# Patient Record
Sex: Female | Born: 1975 | Race: White | Hispanic: No | Smoking: Never smoker
Health system: Southern US, Community
[De-identification: ages and names within clinical notes are randomized; demographics above are authoritative.]

## PROBLEM LIST (undated history)

## (undated) DIAGNOSIS — F32A Depression, unspecified: Secondary | ICD-10-CM

## (undated) DIAGNOSIS — F329 Major depressive disorder, single episode, unspecified: Secondary | ICD-10-CM

## (undated) DIAGNOSIS — G8929 Other chronic pain: Secondary | ICD-10-CM

## (undated) DIAGNOSIS — F4310 Post-traumatic stress disorder, unspecified: Secondary | ICD-10-CM

## (undated) DIAGNOSIS — F419 Anxiety disorder, unspecified: Secondary | ICD-10-CM

## (undated) DIAGNOSIS — M25569 Pain in unspecified knee: Secondary | ICD-10-CM

## (undated) DIAGNOSIS — F988 Other specified behavioral and emotional disorders with onset usually occurring in childhood and adolescence: Secondary | ICD-10-CM

---

## 1997-09-15 ENCOUNTER — Other Ambulatory Visit: Admission: RE | Admit: 1997-09-15 | Discharge: 1997-09-15 | Payer: Self-pay | Admitting: Obstetrics

## 2001-08-22 ENCOUNTER — Emergency Department (HOSPITAL_COMMUNITY): Admission: EM | Admit: 2001-08-22 | Discharge: 2001-08-23 | Payer: Self-pay | Admitting: Emergency Medicine

## 2001-12-02 ENCOUNTER — Encounter: Payer: Self-pay | Admitting: Family Medicine

## 2001-12-02 ENCOUNTER — Emergency Department (HOSPITAL_COMMUNITY): Admission: EM | Admit: 2001-12-02 | Discharge: 2001-12-02 | Payer: Self-pay | Admitting: Emergency Medicine

## 2002-03-21 ENCOUNTER — Encounter: Payer: Self-pay | Admitting: Pulmonary Disease

## 2002-03-21 ENCOUNTER — Ambulatory Visit (HOSPITAL_COMMUNITY): Admission: RE | Admit: 2002-03-21 | Discharge: 2002-03-21 | Payer: Self-pay | Admitting: Pulmonary Disease

## 2002-10-14 ENCOUNTER — Emergency Department (HOSPITAL_COMMUNITY): Admission: EM | Admit: 2002-10-14 | Discharge: 2002-10-14 | Payer: Self-pay | Admitting: *Deleted

## 2003-03-19 ENCOUNTER — Emergency Department (HOSPITAL_COMMUNITY): Admission: EM | Admit: 2003-03-19 | Discharge: 2003-03-19 | Payer: Self-pay | Admitting: Emergency Medicine

## 2003-03-21 ENCOUNTER — Emergency Department (HOSPITAL_COMMUNITY): Admission: AD | Admit: 2003-03-21 | Discharge: 2003-03-21 | Payer: Self-pay | Admitting: Emergency Medicine

## 2003-03-21 ENCOUNTER — Ambulatory Visit (HOSPITAL_COMMUNITY): Admission: RE | Admit: 2003-03-21 | Discharge: 2003-03-21 | Payer: Self-pay | Admitting: Emergency Medicine

## 2003-04-02 ENCOUNTER — Encounter: Admission: RE | Admit: 2003-04-02 | Discharge: 2003-05-27 | Payer: Self-pay | Admitting: Family Medicine

## 2003-05-27 ENCOUNTER — Other Ambulatory Visit: Admission: RE | Admit: 2003-05-27 | Discharge: 2003-05-27 | Payer: Self-pay | Admitting: Family Medicine

## 2003-08-27 ENCOUNTER — Encounter: Admission: RE | Admit: 2003-08-27 | Discharge: 2003-09-28 | Payer: Self-pay | Admitting: Sports Medicine

## 2003-09-04 ENCOUNTER — Emergency Department (HOSPITAL_COMMUNITY): Admission: EM | Admit: 2003-09-04 | Discharge: 2003-09-04 | Payer: Self-pay | Admitting: Emergency Medicine

## 2003-10-11 ENCOUNTER — Emergency Department (HOSPITAL_COMMUNITY): Admission: EM | Admit: 2003-10-11 | Discharge: 2003-10-11 | Payer: Self-pay | Admitting: Emergency Medicine

## 2003-10-12 ENCOUNTER — Emergency Department (HOSPITAL_COMMUNITY): Admission: EM | Admit: 2003-10-12 | Discharge: 2003-10-12 | Payer: Self-pay | Admitting: Emergency Medicine

## 2003-10-13 ENCOUNTER — Emergency Department (HOSPITAL_COMMUNITY): Admission: EM | Admit: 2003-10-13 | Discharge: 2003-10-14 | Payer: Self-pay | Admitting: Emergency Medicine

## 2003-11-23 ENCOUNTER — Emergency Department (HOSPITAL_COMMUNITY): Admission: EM | Admit: 2003-11-23 | Discharge: 2003-11-23 | Payer: Self-pay | Admitting: Emergency Medicine

## 2003-12-15 ENCOUNTER — Emergency Department (HOSPITAL_COMMUNITY): Admission: EM | Admit: 2003-12-15 | Discharge: 2003-12-15 | Payer: Self-pay | Admitting: Emergency Medicine

## 2003-12-17 ENCOUNTER — Emergency Department (HOSPITAL_COMMUNITY): Admission: EM | Admit: 2003-12-17 | Discharge: 2003-12-17 | Payer: Self-pay | Admitting: Family Medicine

## 2003-12-20 ENCOUNTER — Emergency Department (HOSPITAL_COMMUNITY): Admission: EM | Admit: 2003-12-20 | Discharge: 2003-12-20 | Payer: Self-pay | Admitting: Family Medicine

## 2003-12-23 ENCOUNTER — Emergency Department (HOSPITAL_COMMUNITY): Admission: EM | Admit: 2003-12-23 | Discharge: 2003-12-23 | Payer: Self-pay | Admitting: Emergency Medicine

## 2004-01-30 ENCOUNTER — Emergency Department (HOSPITAL_COMMUNITY): Admission: EM | Admit: 2004-01-30 | Discharge: 2004-01-30 | Payer: Self-pay | Admitting: Family Medicine

## 2004-04-08 ENCOUNTER — Emergency Department (HOSPITAL_COMMUNITY): Admission: EM | Admit: 2004-04-08 | Discharge: 2004-04-08 | Payer: Self-pay | Admitting: Emergency Medicine

## 2004-04-15 ENCOUNTER — Emergency Department (HOSPITAL_COMMUNITY): Admission: EM | Admit: 2004-04-15 | Discharge: 2004-04-15 | Payer: Self-pay | Admitting: Emergency Medicine

## 2004-05-25 ENCOUNTER — Emergency Department (HOSPITAL_COMMUNITY): Admission: EM | Admit: 2004-05-25 | Discharge: 2004-05-26 | Payer: Self-pay

## 2004-05-30 ENCOUNTER — Emergency Department (HOSPITAL_COMMUNITY): Admission: EM | Admit: 2004-05-30 | Discharge: 2004-05-30 | Payer: Self-pay | Admitting: Family Medicine

## 2004-08-11 ENCOUNTER — Inpatient Hospital Stay (HOSPITAL_COMMUNITY): Admission: AD | Admit: 2004-08-11 | Discharge: 2004-08-12 | Payer: Self-pay | Admitting: Obstetrics & Gynecology

## 2004-08-22 ENCOUNTER — Emergency Department (HOSPITAL_COMMUNITY): Admission: EM | Admit: 2004-08-22 | Discharge: 2004-08-22 | Payer: Self-pay | Admitting: Emergency Medicine

## 2004-08-22 ENCOUNTER — Inpatient Hospital Stay (HOSPITAL_COMMUNITY): Admission: AD | Admit: 2004-08-22 | Discharge: 2004-08-22 | Payer: Self-pay | Admitting: Obstetrics & Gynecology

## 2004-08-22 ENCOUNTER — Emergency Department (HOSPITAL_COMMUNITY): Admission: EM | Admit: 2004-08-22 | Discharge: 2004-08-22 | Payer: Self-pay | Admitting: Family Medicine

## 2004-09-23 ENCOUNTER — Emergency Department (HOSPITAL_COMMUNITY): Admission: EM | Admit: 2004-09-23 | Discharge: 2004-09-23 | Payer: Self-pay | Admitting: Emergency Medicine

## 2004-09-28 ENCOUNTER — Emergency Department (HOSPITAL_COMMUNITY): Admission: EM | Admit: 2004-09-28 | Discharge: 2004-09-28 | Payer: Self-pay | Admitting: Emergency Medicine

## 2004-12-19 ENCOUNTER — Emergency Department (HOSPITAL_COMMUNITY): Admission: EM | Admit: 2004-12-19 | Discharge: 2004-12-19 | Payer: Self-pay | Admitting: Emergency Medicine

## 2005-02-06 ENCOUNTER — Emergency Department (HOSPITAL_COMMUNITY): Admission: EM | Admit: 2005-02-06 | Discharge: 2005-02-06 | Payer: Self-pay | Admitting: Emergency Medicine

## 2005-08-28 ENCOUNTER — Emergency Department (HOSPITAL_COMMUNITY): Admission: EM | Admit: 2005-08-28 | Discharge: 2005-08-29 | Payer: Self-pay | Admitting: Emergency Medicine

## 2006-05-31 ENCOUNTER — Emergency Department (HOSPITAL_COMMUNITY): Admission: EM | Admit: 2006-05-31 | Discharge: 2006-05-31 | Payer: Self-pay | Admitting: Emergency Medicine

## 2006-10-24 ENCOUNTER — Emergency Department (HOSPITAL_COMMUNITY): Admission: EM | Admit: 2006-10-24 | Discharge: 2006-10-24 | Payer: Self-pay | Admitting: Emergency Medicine

## 2006-11-05 ENCOUNTER — Emergency Department (HOSPITAL_COMMUNITY): Admission: EM | Admit: 2006-11-05 | Discharge: 2006-11-05 | Payer: Self-pay | Admitting: *Deleted

## 2007-03-07 ENCOUNTER — Emergency Department (HOSPITAL_COMMUNITY): Admission: EM | Admit: 2007-03-07 | Discharge: 2007-03-07 | Payer: Self-pay | Admitting: Emergency Medicine

## 2007-03-21 ENCOUNTER — Inpatient Hospital Stay (HOSPITAL_COMMUNITY): Admission: AD | Admit: 2007-03-21 | Discharge: 2007-03-21 | Payer: Self-pay | Admitting: Gynecology

## 2007-04-01 ENCOUNTER — Inpatient Hospital Stay (HOSPITAL_COMMUNITY): Admission: AD | Admit: 2007-04-01 | Discharge: 2007-04-01 | Payer: Self-pay | Admitting: Obstetrics and Gynecology

## 2007-05-27 ENCOUNTER — Emergency Department (HOSPITAL_COMMUNITY): Admission: EM | Admit: 2007-05-27 | Discharge: 2007-05-27 | Payer: Self-pay | Admitting: Emergency Medicine

## 2007-07-22 ENCOUNTER — Emergency Department (HOSPITAL_COMMUNITY): Admission: EM | Admit: 2007-07-22 | Discharge: 2007-07-22 | Payer: Self-pay | Admitting: Emergency Medicine

## 2007-07-23 ENCOUNTER — Emergency Department (HOSPITAL_COMMUNITY): Admission: EM | Admit: 2007-07-23 | Discharge: 2007-07-23 | Payer: Self-pay | Admitting: Family Medicine

## 2008-01-10 ENCOUNTER — Emergency Department (HOSPITAL_COMMUNITY): Admission: EM | Admit: 2008-01-10 | Discharge: 2008-01-10 | Payer: Self-pay | Admitting: Emergency Medicine

## 2008-06-21 IMAGING — CR DG KNEE COMPLETE 4+V*L*
4 series · 4 of 4 positions shown · non-contrast
Comparison: None.

CLINICAL DATA: Blunt injury to patella ? pain and swelling. 
 LEFT KNEE ? 4 VIEW:

[view not recorded (1 of 4)]
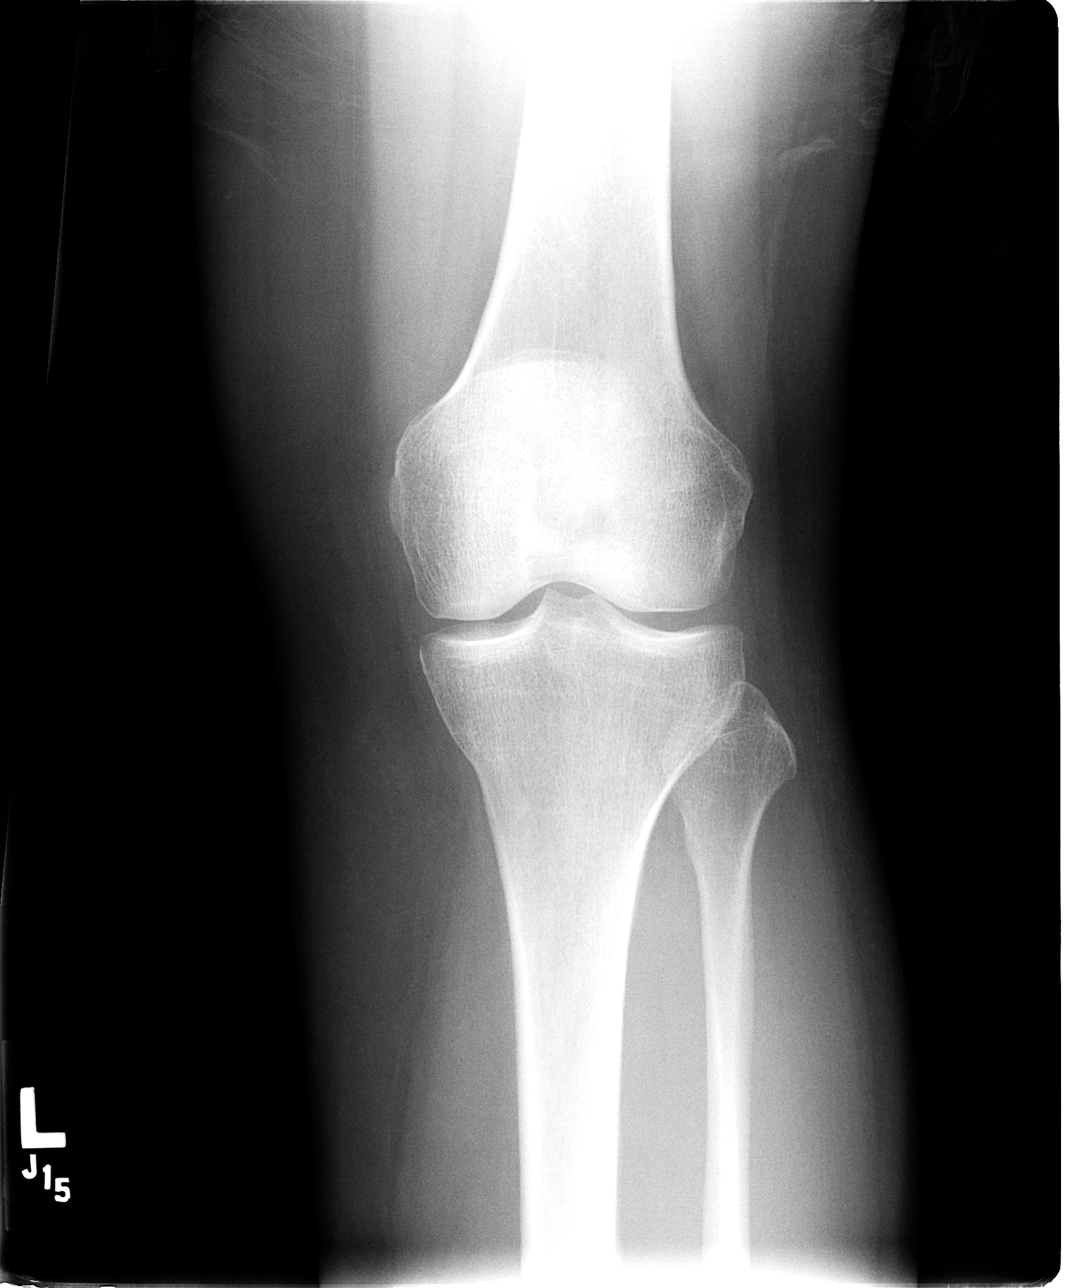

[view not recorded (2 of 4)]
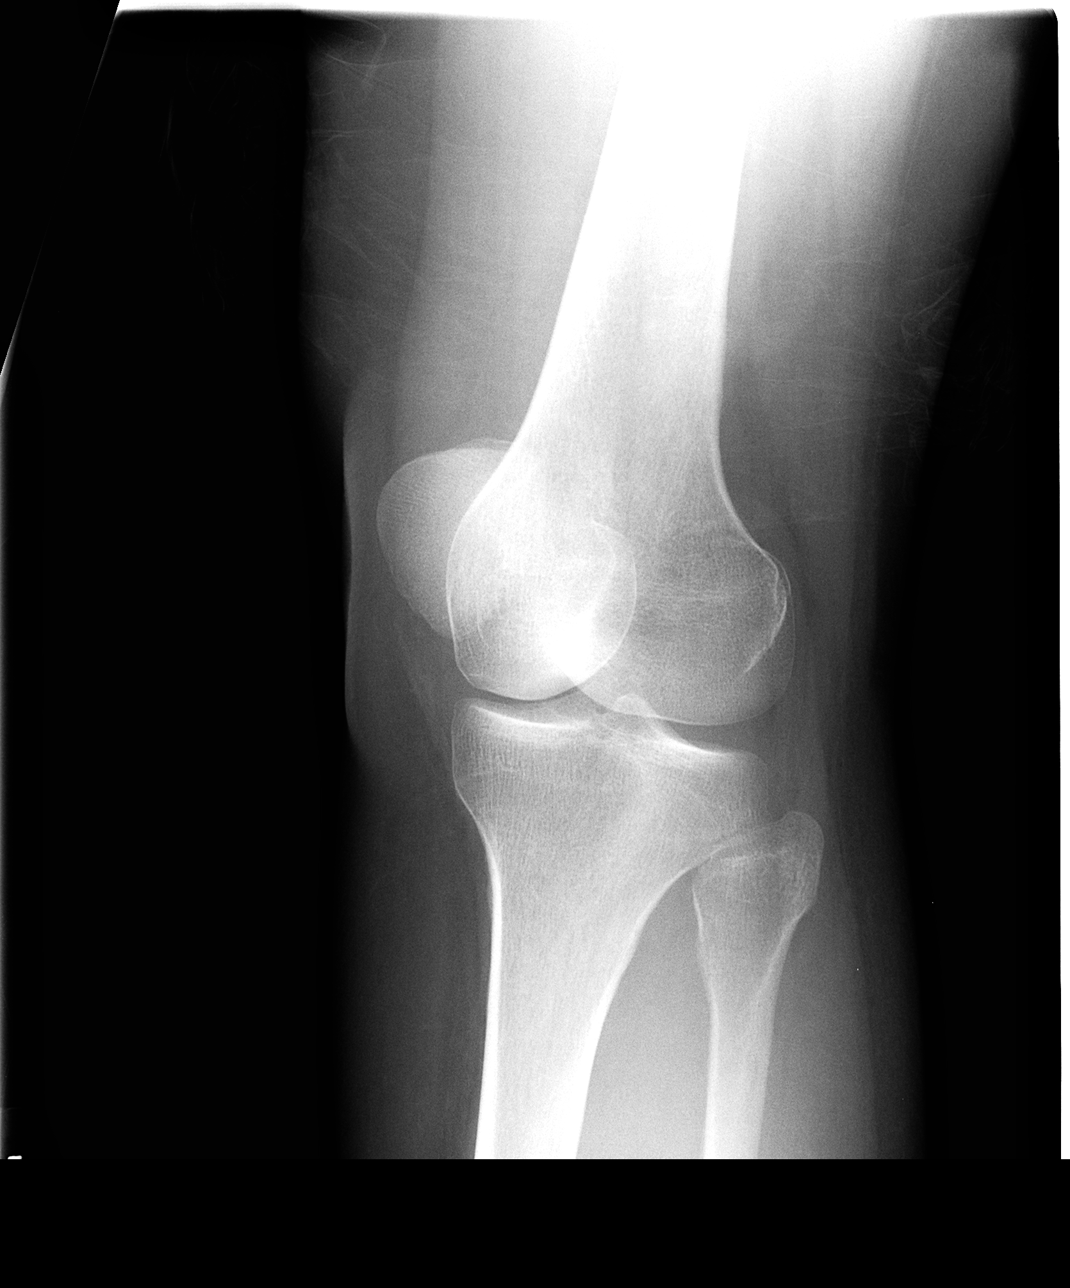

[view not recorded (3 of 4)]
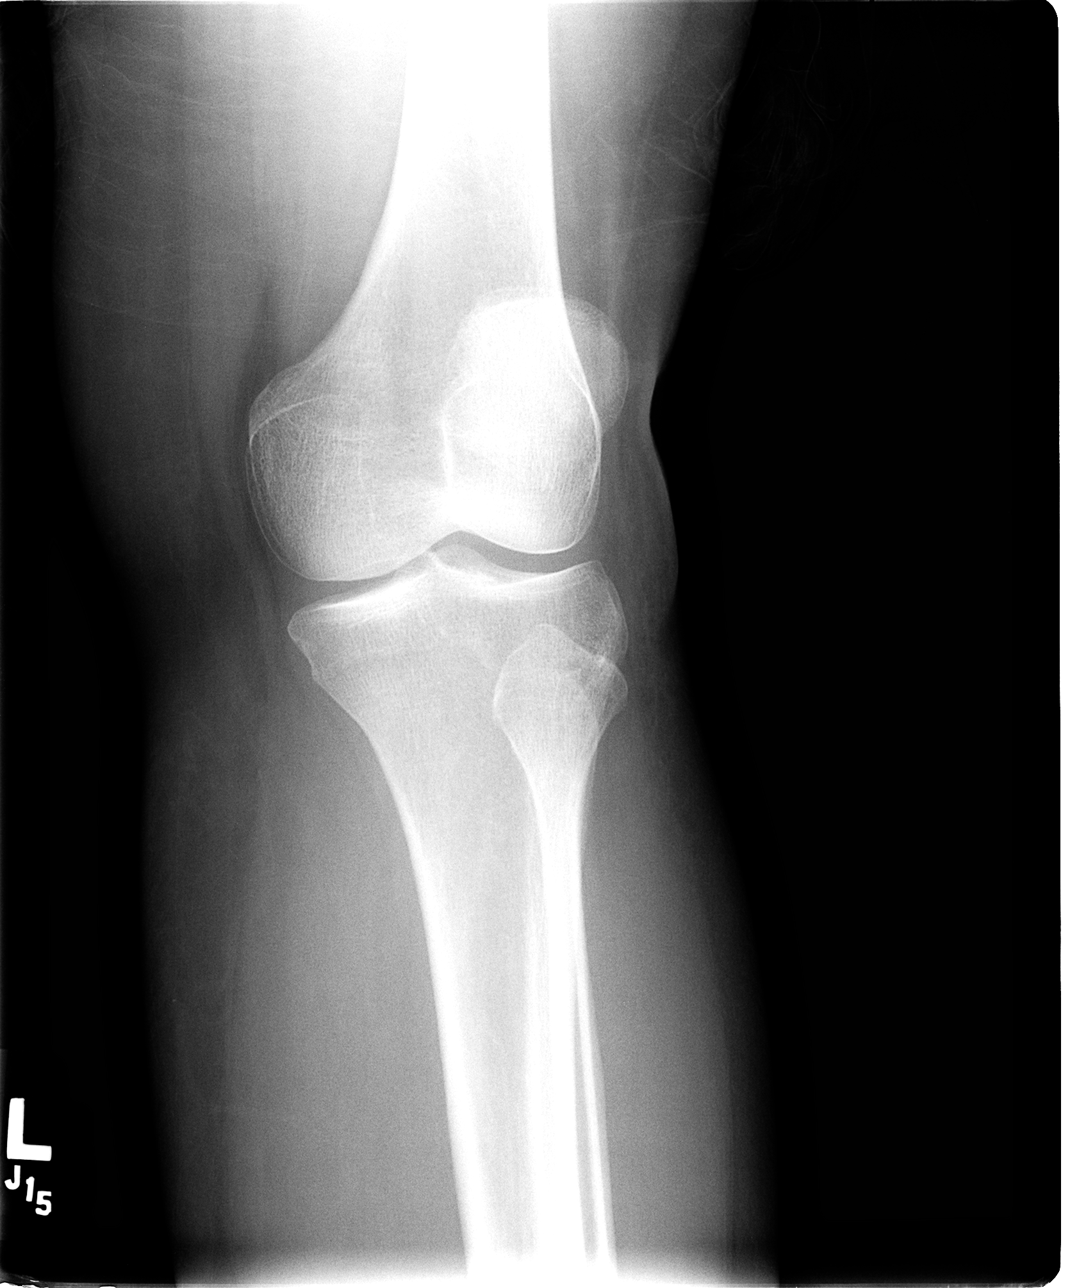

[view not recorded (4 of 4)]
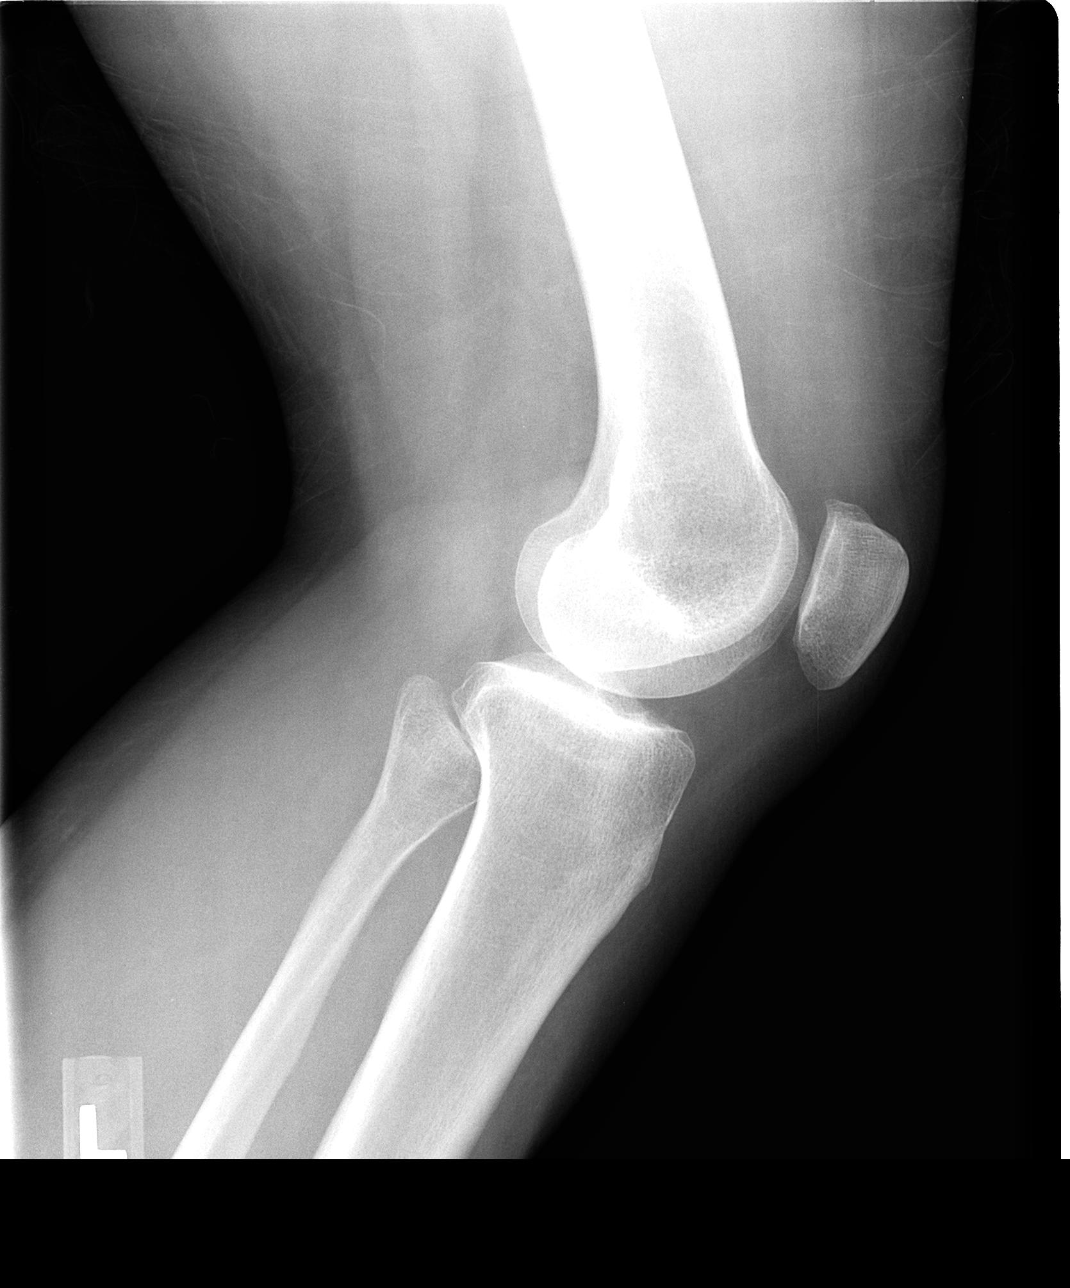

[4 of 4 positions shown; findings below may reference images not displayed]

FINDINGS: There is minor degenerative spurring of the patella.  There is no fracture, dislocation, or joint effusion.
IMPRESSION: Mild degenerative changes ? no acute findings.

## 2008-07-09 ENCOUNTER — Emergency Department (HOSPITAL_BASED_OUTPATIENT_CLINIC_OR_DEPARTMENT_OTHER): Admission: EM | Admit: 2008-07-09 | Discharge: 2008-07-09 | Payer: Self-pay | Admitting: Emergency Medicine

## 2008-10-04 ENCOUNTER — Emergency Department (HOSPITAL_BASED_OUTPATIENT_CLINIC_OR_DEPARTMENT_OTHER): Admission: EM | Admit: 2008-10-04 | Discharge: 2008-10-04 | Payer: Self-pay | Admitting: Emergency Medicine

## 2009-08-02 ENCOUNTER — Emergency Department (HOSPITAL_BASED_OUTPATIENT_CLINIC_OR_DEPARTMENT_OTHER): Admission: EM | Admit: 2009-08-02 | Discharge: 2009-08-02 | Payer: Self-pay | Admitting: Emergency Medicine

## 2009-08-02 ENCOUNTER — Ambulatory Visit: Payer: Self-pay | Admitting: Diagnostic Radiology

## 2009-08-09 ENCOUNTER — Inpatient Hospital Stay (HOSPITAL_COMMUNITY): Admission: AD | Admit: 2009-08-09 | Discharge: 2009-08-09 | Payer: Self-pay | Admitting: Family Medicine

## 2009-08-09 ENCOUNTER — Ambulatory Visit: Payer: Self-pay | Admitting: Physician Assistant

## 2010-03-03 ENCOUNTER — Emergency Department (HOSPITAL_BASED_OUTPATIENT_CLINIC_OR_DEPARTMENT_OTHER)
Admission: EM | Admit: 2010-03-03 | Discharge: 2010-03-03 | Payer: Self-pay | Source: Home / Self Care | Admitting: Emergency Medicine

## 2010-06-05 ENCOUNTER — Encounter: Payer: Self-pay | Admitting: Physical Medicine and Rehabilitation

## 2010-06-06 ENCOUNTER — Emergency Department (HOSPITAL_BASED_OUTPATIENT_CLINIC_OR_DEPARTMENT_OTHER)
Admission: EM | Admit: 2010-06-06 | Discharge: 2010-06-06 | Payer: Self-pay | Source: Home / Self Care | Admitting: Emergency Medicine

## 2010-08-07 LAB — URINALYSIS, ROUTINE W REFLEX MICROSCOPIC
Glucose, UA: NEGATIVE mg/dL
Hgb urine dipstick: NEGATIVE
Ketones, ur: 40 mg/dL — AB
Nitrite: NEGATIVE
Protein, ur: NEGATIVE mg/dL
Specific Gravity, Urine: >1.03 — ABNORMAL HIGH (ref 1.005–1.030)
Urobilinogen, UA: 1 mg/dL (ref 0.0–1.0)
pH: 6 (ref 5.0–8.0)

## 2010-08-08 LAB — DIFFERENTIAL
Basophils Absolute: 0.1 10*3/uL (ref 0.0–0.1)
Basophils Relative: 1 % (ref 0–1)
Eosinophils Absolute: 0.1 10*3/uL (ref 0.0–0.7)
Eosinophils Relative: 1 % (ref 0–5)
Lymphocytes Relative: 21 % (ref 12–46)
Lymphs Abs: 1.5 10*3/uL (ref 0.7–4.0)
Monocytes Absolute: 0.4 10*3/uL (ref 0.1–1.0)
Monocytes Relative: 6 % (ref 3–12)
Neutro Abs: 5.2 10*3/uL (ref 1.7–7.7)
Neutrophils Relative %: 71 % (ref 43–77)

## 2010-08-08 LAB — CBC
HCT: 37.8 % (ref 36.0–46.0)
Hemoglobin: 13 g/dL (ref 12.0–15.0)
MCHC: 34.4 g/dL (ref 30.0–36.0)
MCV: 86.4 fL (ref 78.0–100.0)
Platelets: 280 10*3/uL (ref 150–400)
RBC: 4.38 MIL/uL (ref 3.87–5.11)
RDW: 11.5 % (ref 11.5–15.5)
WBC: 7.3 10*3/uL (ref 4.0–10.5)

## 2010-08-08 LAB — BASIC METABOLIC PANEL
Chloride: 102 mEq/L (ref 96–112)
Creatinine, Ser: 0.6 mg/dL (ref 0.4–1.2)
GFR calc Af Amer: >60 mL/min (ref >60)
Potassium: 3.9 mEq/L (ref 3.5–5.1)

## 2010-08-08 LAB — ABO/RH: ABO/RH(D): O POS

## 2010-08-08 LAB — URINALYSIS, ROUTINE W REFLEX MICROSCOPIC
Bilirubin Urine: NEGATIVE
Glucose, UA: NEGATIVE mg/dL
Hgb urine dipstick: NEGATIVE
Ketones, ur: 15 mg/dL — AB
Nitrite: NEGATIVE
Protein, ur: NEGATIVE mg/dL
Specific Gravity, Urine: 1.022 (ref 1.005–1.030)
Urobilinogen, UA: 1 mg/dL (ref 0.0–1.0)
pH: 8 (ref 5.0–8.0)

## 2010-08-08 LAB — HEPATIC FUNCTION PANEL
Albumin: 4.2 g/dL (ref 3.5–5.2)
Alkaline Phosphatase: 79 U/L (ref 39–117)
Total Protein: 7.9 g/dL (ref 6.0–8.3)

## 2010-08-08 LAB — PREGNANCY, URINE: Preg Test, Ur: POSITIVE

## 2010-08-08 LAB — LIPASE, BLOOD: Lipase: 23 U/L (ref 23–300)

## 2010-08-08 LAB — HCG, QUANTITATIVE, PREGNANCY: hCG, Beta Chain, Quant, S: 68752 m[IU]/mL — ABNORMAL HIGH (ref <5)

## 2010-08-09 ENCOUNTER — Emergency Department (HOSPITAL_BASED_OUTPATIENT_CLINIC_OR_DEPARTMENT_OTHER)
Admission: EM | Admit: 2010-08-09 | Discharge: 2010-08-09 | Disposition: A | Discharge status: Home / Self Care | Payer: Self-pay | Attending: Emergency Medicine | Admitting: Emergency Medicine

## 2010-08-09 DIAGNOSIS — M25569 Pain in unspecified knee: Secondary | ICD-10-CM | POA: Insufficient documentation

## 2010-10-30 ENCOUNTER — Emergency Department (HOSPITAL_COMMUNITY)
Admission: EM | Admit: 2010-10-30 | Discharge: 2010-10-30 | Disposition: A | Discharge status: Home / Self Care | Payer: Self-pay | Attending: Emergency Medicine | Admitting: Emergency Medicine

## 2010-10-30 DIAGNOSIS — H5710 Ocular pain, unspecified eye: Secondary | ICD-10-CM | POA: Insufficient documentation

## 2010-10-30 DIAGNOSIS — R51 Headache: Secondary | ICD-10-CM | POA: Insufficient documentation

## 2010-12-02 ENCOUNTER — Emergency Department (HOSPITAL_BASED_OUTPATIENT_CLINIC_OR_DEPARTMENT_OTHER)
Admission: EM | Admit: 2010-12-02 | Discharge: 2010-12-02 | Disposition: A | Discharge status: Home / Self Care | Payer: Self-pay | Attending: Emergency Medicine | Admitting: Emergency Medicine

## 2010-12-02 ENCOUNTER — Encounter: Payer: Self-pay | Admitting: Emergency Medicine

## 2010-12-02 DIAGNOSIS — H5710 Ocular pain, unspecified eye: Secondary | ICD-10-CM | POA: Insufficient documentation

## 2010-12-02 DIAGNOSIS — H18829 Corneal disorder due to contact lens, unspecified eye: Secondary | ICD-10-CM | POA: Insufficient documentation

## 2010-12-02 DIAGNOSIS — S0501XA Injury of conjunctiva and corneal abrasion without foreign body, right eye, initial encounter: Secondary | ICD-10-CM

## 2010-12-02 MED ORDER — FLUORESCEIN SODIUM 1 MG OP STRP
ORAL_STRIP | OPHTHALMIC | Status: AC
  Administered 2010-12-02: 09:00:00
  Filled 2010-12-02: qty 1

## 2010-12-02 MED ORDER — TETANUS-DIPHTHERIA TOXOIDS TD 5-2 LFU IM INJ
0.5000 mL | INJECTION | Freq: Once | INTRAMUSCULAR | Status: AC
  Administered 2010-12-02: 0.5 mL via INTRAMUSCULAR
  Filled 2010-12-02: qty 0.5

## 2010-12-02 MED ORDER — OXYCODONE-ACETAMINOPHEN 5-325 MG PO TABS: 1.0000 | ORAL_TABLET | Freq: Four times a day (QID) | ORAL | Status: AC | PRN

## 2010-12-02 MED ORDER — TETRACAINE HCL 0.5 % OP SOLN
OPHTHALMIC | Status: AC
  Administered 2010-12-02: 09:00:00
  Filled 2010-12-02: qty 2

## 2010-12-02 MED ORDER — CIPROFLOXACIN HCL 0.3 % OP OINT: TOPICAL_OINTMENT | OPHTHALMIC | Status: AC

## 2010-12-02 NOTE — ED Notes (Signed)
MD at bedside. 

## 2010-12-02 NOTE — ED Provider Notes (Addendum)
History    History of Present Illness   Patient Identification Heidi Pearson is a 35 y.o. female.  Patient information was obtained from patient. History/Exam limitations: none. Patient presented to the Emergency Department ambulatory.  Chief Complaint  Eye Pain   Patient presents for evaluation of redness, foreign body sensation, photophobia and pain in right eye to the right eye . Onset of symptoms was gradual starting 3 days ago, with gradually worsening since that time. There is no discharge present. There is not a history trauma to the eye. There is not a history of foreign body getting into eye. The patient is a contact lens wearer.  No past medical history on file. No family history on file. Current Facility-Administered Medications  Medication Dose Route Frequency Provider Last Rate Last Dose  . fluorescein 1 MG ophthalmic strip           . tetanus & diphtheria toxoids (adult) [facility]) injection 0.5 mL  0.5 mL Intramuscular Once Ethelda Chick, MD      . tetracaine (PONTOCAINE) 0.5 % ophthalmic solution            Current Outpatient Prescriptions  Medication Sig Dispense Refill  . ibuprofen (ADVIL,MOTRIN) 800 MG tablet Take 800 mg by mouth every 8 (eight) hours as needed.         No Known Allergies History   Social History  . Marital Status: Legally Separated    Spouse Name: N/A    Number of Children: N/A  . Years of Education: N/A   Occupational History  . Not on file.   Social History Main Topics  . Smoking status: Not on file  . Smokeless tobacco: Not on file  . Alcohol Use:   . Drug Use:   . Sexually Active:    Other Topics Concern  . Not on file   Social History Narrative  . No narrative on file    Review of Systems Pertinent items are noted in HPI. 10 Systems reviewed and are negative for acute change except as noted in the HPI.  Physical Exam   BP 143/92  Pulse 95  Temp(Src) 98.9 F (37.2 C) (Oral)  Resp 18  Ht 5\' 3"  (1.6 m)  Wt  170 lb (77.111 kg)  BMI 30.11 kg/m2  SpO2 96%  LMP 11/18/2010 Visual Acuity: OS: 20/50      OD:  20/50, OU 20/30 General: Awake. Alert in no acute distress. HEENT: atraumatic, normocephalic. PERRLA EOMI. Conjunctiva: positive for injection. Conjunctiva: negative for lid erythema.  Fluorescein negative for ulcers. Positive for approx 1mm abrasion at approx 12 oclock position Neck- no cervical LAD Lungs- CTAB CV-RRR no murmur, no rubs, no gallops  ED Course   Studies: None indicated.  Records Reviewed: Old medical records. Nursing notes.  Treatments:  Disposition: Home. Eye drops, pain control, opthalmology referral  Chief Complaint  Patient presents with  . Eye Pain   HPI  No past medical history on file.  Past Surgical History  Procedure Date  . Cesarean section     No family history on file.  History  Substance Use Topics  . Smoking status: Not on file  . Smokeless tobacco: Not on file  . Alcohol Use:     OB History    Grav Para Term Preterm Abortions TAB SAB Ect Mult Living                  Review of Systems  Physical Exam  BP 143/92  Pulse  95  Temp(Src) 98.9 F (37.2 C) (Oral)  Resp 18  Ht 5\' 3"  (1.6 m)  Wt 170 lb (77.111 kg)  BMI 30.11 kg/m2  SpO2 96%  LMP 11/18/2010  Physical Exam  ED Course  Procedures  MDM Pt with corneal abrasion on right cornea, pt is contact lens wearer.  Discharged with pain meds, eye drops and opthalmology referral       Ethelda Chick, MD 12/02/10 [phone]

## 2010-12-02 NOTE — ED Notes (Signed)
Pt states she took contact lens out of R eye with increased irritation and Non stop pain per pt report with tearing.x 24hrs

## 2010-12-02 NOTE — ED Notes (Signed)
Eye patch applied to right eye

## 2010-12-06 ENCOUNTER — Emergency Department (HOSPITAL_BASED_OUTPATIENT_CLINIC_OR_DEPARTMENT_OTHER)
Admission: EM | Admit: 2010-12-06 | Discharge: 2010-12-06 | Disposition: A | Discharge status: Home / Self Care | Payer: Self-pay | Attending: Emergency Medicine | Admitting: Emergency Medicine

## 2010-12-06 ENCOUNTER — Encounter (HOSPITAL_BASED_OUTPATIENT_CLINIC_OR_DEPARTMENT_OTHER): Payer: Self-pay | Admitting: *Deleted

## 2010-12-06 DIAGNOSIS — H5710 Ocular pain, unspecified eye: Secondary | ICD-10-CM | POA: Insufficient documentation

## 2010-12-06 DIAGNOSIS — H209 Unspecified iridocyclitis: Secondary | ICD-10-CM | POA: Insufficient documentation

## 2010-12-06 MED ORDER — HOMATROPINE HBR 2 % OP SOLN
OPHTHALMIC | Status: AC
  Administered 2010-12-06: 22:00:00 via OPHTHALMIC
  Filled 2010-12-06: qty 5

## 2010-12-06 MED ORDER — FLUORESCEIN SODIUM 1 MG OP STRP
ORAL_STRIP | OPHTHALMIC | Status: AC
  Filled 2010-12-06: qty 1

## 2010-12-06 MED ORDER — OXYCODONE-ACETAMINOPHEN 5-325 MG PO TABS: 1.0000 | ORAL_TABLET | Freq: Four times a day (QID) | ORAL | Status: AC | PRN

## 2010-12-06 MED ORDER — TETRACAINE HCL 0.5 % OP SOLN
OPHTHALMIC | Status: AC
  Filled 2010-12-06: qty 2

## 2010-12-06 NOTE — ED Provider Notes (Signed)
History     Chief Complaint  Patient presents with  . Eye Pain   HPI  Pt seen in the ED for R eye pain a few days ago and diagnosed with corneal abrasion. She has been using cipro ointment, not wearing contacts and taking pain medications, but eye has continued to be very painful, particular with bright lights. No drainage from the eye. She has had some headache from the pain. No blurry vision.   History reviewed. No pertinent past medical history.  Past Surgical History  Procedure Date  . Cesarean section     History reviewed. No pertinent family history.  History  Substance Use Topics  . Smoking status: Not on file  . Smokeless tobacco: Not on file  . Alcohol Use:     OB History    Grav Para Term Preterm Abortions TAB SAB Ect Mult Living                  Review of Systems  Constitutional: Negative for fever.  HENT: Negative for congestion and sore throat.   Eyes: Positive for photophobia and pain.  Respiratory: Negative for cough and shortness of breath.   Cardiovascular: Negative for chest pain.  Gastrointestinal: Negative for vomiting and abdominal pain.  Genitourinary: Negative for dysuria.  Musculoskeletal: Negative for myalgias.  Skin: Negative for rash.  Neurological: Positive for headaches.    Physical Exam  BP 119/58  Pulse 80  Temp(Src) 98.9 F (37.2 C) (Oral)  Resp 16  Ht 5\' 3"  (1.6 m)  Wt 170 lb (77.111 kg)  BMI 30.11 kg/m2  SpO2 100%  LMP 11/18/2010  Physical Exam  Nursing note and vitals reviewed. Constitutional: She is oriented to person, place, and time. She appears well-developed and well-nourished.  HENT:  Head: Normocephalic and atraumatic.  Eyes: EOM are normal. Pupils are equal, round, and reactive to light. Right eye exhibits no discharge. Left eye exhibits no discharge.       No abrasion or ulceration seen on fluorescein exam. Anterior chamber is clear. Pain with consensual light response. IOP on the R is 10.   Neck: Normal  range of motion. Neck supple.  Cardiovascular: Normal rate, normal heart sounds and intact distal pulses.   Pulmonary/Chest: Effort normal and breath sounds normal.  Abdominal: Bowel sounds are normal. She exhibits no distension. There is no tenderness.  Musculoskeletal: Normal range of motion. She exhibits no edema and no tenderness.  Neurological: She is alert and oriented to person, place, and time. No cranial nerve deficit.  Skin: Skin is warm and dry. No rash noted.  Psychiatric: She has a normal mood and affect.    ED Course  Procedures  MDM Likely has a reactive iritis. No evidence of acute glaucoma or cells in anterior chamber. Minimal relief with cycloplegics. Given bottle of homatropine and advised to use 2 drops every 6-8hrs PRN pain. She has followup with Ophtho scheduled in 3 days.       Charles B. Bernette Mayers, MD 12/06/10 2300

## 2010-12-06 NOTE — ED Notes (Signed)
Was seen here on 7/20 and dx'd with corneal abrasion to right eye. Pt c/o pain that keeps her from sleeping and can't see eye doctor until Friday.

## 2011-02-21 LAB — URINALYSIS, ROUTINE W REFLEX MICROSCOPIC
Ketones, ur: 15 — AB
Nitrite: NEGATIVE
Protein, ur: NEGATIVE
Urobilinogen, UA: 0.2
pH: 6.5

## 2011-02-21 LAB — WET PREP, GENITAL

## 2011-02-21 LAB — RAPID STREP SCREEN (MED CTR MEBANE ONLY): Streptococcus, Group A Screen (Direct): NEGATIVE

## 2011-02-22 LAB — ABO/RH: ABO/RH(D): O POS

## 2011-02-22 LAB — DIFFERENTIAL
Lymphocytes Relative: 26
Monocytes Absolute: 0.6
Monocytes Relative: 9
Neutro Abs: 4.9
Neutrophils Relative %: 65

## 2011-02-22 LAB — URINALYSIS, ROUTINE W REFLEX MICROSCOPIC
Nitrite: NEGATIVE
Specific Gravity, Urine: 1.025
pH: 6

## 2011-02-22 LAB — CBC
Hemoglobin: 11.1 — ABNORMAL LOW
RBC: 3.52 — ABNORMAL LOW
WBC: 7.5

## 2011-02-22 LAB — WET PREP, GENITAL
Clue Cells Wet Prep HPF POC: NONE SEEN
Trich, Wet Prep: NONE SEEN

## 2011-02-22 LAB — PREGNANCY, URINE: Preg Test, Ur: POSITIVE

## 2011-02-22 LAB — GC/CHLAMYDIA PROBE AMP, GENITAL
Chlamydia, DNA Probe: NEGATIVE
GC Probe Amp, Genital: NEGATIVE

## 2012-01-07 ENCOUNTER — Encounter (HOSPITAL_COMMUNITY): Payer: Self-pay | Admitting: Emergency Medicine

## 2012-01-07 ENCOUNTER — Emergency Department (HOSPITAL_COMMUNITY)
Admission: EM | Admit: 2012-01-07 | Discharge: 2012-01-08 | Disposition: A | Discharge status: Other Acute Inpatient Hospital | Payer: Self-pay | Attending: Emergency Medicine | Admitting: Emergency Medicine

## 2012-01-07 DIAGNOSIS — IMO0002 Reserved for concepts with insufficient information to code with codable children: Secondary | ICD-10-CM | POA: Insufficient documentation

## 2012-01-07 DIAGNOSIS — S60812A Abrasion of left wrist, initial encounter: Secondary | ICD-10-CM

## 2012-01-07 DIAGNOSIS — F32A Depression, unspecified: Secondary | ICD-10-CM

## 2012-01-07 DIAGNOSIS — F3289 Other specified depressive episodes: Secondary | ICD-10-CM | POA: Insufficient documentation

## 2012-01-07 DIAGNOSIS — F329 Major depressive disorder, single episode, unspecified: Secondary | ICD-10-CM

## 2012-01-07 DIAGNOSIS — X789XXA Intentional self-harm by unspecified sharp object, initial encounter: Secondary | ICD-10-CM | POA: Insufficient documentation

## 2012-01-07 DIAGNOSIS — R45851 Suicidal ideations: Secondary | ICD-10-CM | POA: Insufficient documentation

## 2012-01-07 DIAGNOSIS — F419 Anxiety disorder, unspecified: Secondary | ICD-10-CM | POA: Insufficient documentation

## 2012-01-07 HISTORY — DX: Depression, unspecified: F32.A

## 2012-01-07 HISTORY — DX: Major depressive disorder, single episode, unspecified: F32.9

## 2012-01-07 HISTORY — DX: Anxiety disorder, unspecified: F41.9

## 2012-01-07 LAB — POCT PREGNANCY, URINE: Preg Test, Ur: NEGATIVE

## 2012-01-07 LAB — COMPREHENSIVE METABOLIC PANEL
Albumin: 3.4 g/dL — ABNORMAL LOW (ref 3.5–5.2)
BUN: 7 mg/dL (ref 6–23)
Calcium: 9.5 mg/dL (ref 8.4–10.5)
Creatinine, Ser: 0.72 mg/dL (ref 0.50–1.10)
Total Bilirubin: 0.2 mg/dL — ABNORMAL LOW (ref 0.3–1.2)
Total Protein: 7.9 g/dL (ref 6.0–8.3)

## 2012-01-07 LAB — RAPID URINE DRUG SCREEN, HOSP PERFORMED
Benzodiazepines: POSITIVE — AB
Cocaine: POSITIVE — AB

## 2012-01-07 LAB — CBC
HCT: 39.1 % (ref 36.0–46.0)
MCH: 28.1 pg (ref 26.0–34.0)
MCHC: 32.2 g/dL (ref 30.0–36.0)
MCV: 87.3 fL (ref 78.0–100.0)
RDW: 12.4 % (ref 11.5–15.5)

## 2012-01-07 LAB — ACETAMINOPHEN LEVEL: Acetaminophen (Tylenol), Serum: <15 ug/mL (ref 10–30)

## 2012-01-07 LAB — ETHANOL: Alcohol, Ethyl (B): <11 mg/dL (ref 0–11)

## 2012-01-07 MED ORDER — AMPHETAMINE-DEXTROAMPHETAMINE 10 MG PO TABS
10.0000 mg | ORAL_TABLET | Freq: Two times a day (BID) | ORAL | Status: DC
  Filled 2012-01-07: qty 1

## 2012-01-07 MED ORDER — METHADONE HCL 5 MG PO TABS
130.0000 mg | ORAL_TABLET | Freq: Every day | ORAL | Status: DC
  Administered 2012-01-07: 130 mg via ORAL

## 2012-01-07 MED ORDER — LORAZEPAM 1 MG PO TABS
1.0000 mg | ORAL_TABLET | Freq: Three times a day (TID) | ORAL | Status: DC | PRN
  Administered 2012-01-07: 1 mg via ORAL
  Filled 2012-01-07 (×2): qty 1

## 2012-01-07 MED ORDER — ZOLPIDEM TARTRATE 5 MG PO TABS
5.0000 mg | ORAL_TABLET | Freq: Every evening | ORAL | Status: DC | PRN
  Administered 2012-01-08: 5 mg via ORAL
  Filled 2012-01-07: qty 1

## 2012-01-07 MED ORDER — ACETAMINOPHEN 325 MG PO TABS: 650.0000 mg | ORAL_TABLET | ORAL | Status: DC | PRN

## 2012-01-07 MED ORDER — LORAZEPAM 1 MG PO TABS
1.0000 mg | ORAL_TABLET | Freq: Once | ORAL | Status: AC
Start: 2012-01-07 — End: 2012-01-07
  Administered 2012-01-07: 1 mg via ORAL
  Filled 2012-01-07: qty 1

## 2012-01-07 MED ORDER — NICOTINE 21 MG/24HR TD PT24: 21.0000 mg | MEDICATED_PATCH | Freq: Every day | TRANSDERMAL | Status: DC | PRN

## 2012-01-07 MED ORDER — ALUM & MAG HYDROXIDE-SIMETH 200-200-20 MG/5ML PO SUSP: 30.0000 mL | ORAL | Status: DC | PRN

## 2012-01-07 MED ORDER — IBUPROFEN 200 MG PO TABS: 400.0000 mg | ORAL_TABLET | Freq: Three times a day (TID) | ORAL | Status: DC | PRN

## 2012-01-07 MED ORDER — ONDANSETRON HCL 8 MG PO TABS: 4.0000 mg | ORAL_TABLET | Freq: Three times a day (TID) | ORAL | Status: DC | PRN

## 2012-01-07 NOTE — ED Notes (Signed)
Pt confronted about cocaine in drug test and stated she only used it once.

## 2012-01-07 NOTE — ED Notes (Signed)
Charge RN notified need of sitter.

## 2012-01-07 NOTE — ED Provider Notes (Signed)
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 Telepsych consult requested.[phone]  Hurman Horn, MD 01/11/12 [phone]

## 2012-01-07 NOTE — ED Notes (Signed)
Patient refused to take her adderall because she will not be able to sleep tonight if she takes this medication.

## 2012-01-07 NOTE — ED Notes (Signed)
Pt tearful saying that she lost her job and wants to kill herself.  States she was in the middle of slitting her wrist with a razor when her niece caught her and stopped her.  Pt requesting xanax stating that she feels anxious, although pt appears sleepy. PT denies use of narcotics or etoh, stating she is in a heroin program.

## 2012-01-07 NOTE — B[facility] Counselor (Addendum)
[facility] contact ACT Team for more information regarding patient's methadone use; spoke with [facility] and provided the following information:  Pt current takes130mg  methadone liquid; working on methadone program since Oct 10, 2010; pt states that her Counselor at [employer] in [place] is [place]; pt states that her counselor did speak with her within past two days about her positive cocaine test; pt states that she could lose her methadone privileges if the use continues  Waynetta Sandy states that she will continue to staff patient for possible treatment at [facility]...  Dr. Fonnie Jarvis will be ordering a telepsych for patient.Marland KitchenMarland Kitchen

## 2012-01-07 NOTE — ED Notes (Signed)
Patient talking with Telepsych at this time.

## 2012-01-07 NOTE — ED Provider Notes (Signed)
History     CSN: [id number]  Arrival date & time 01/07/12  0149   First MD Initiated Contact with Patient 01/07/12 0247      Chief Complaint  Patient presents with  . Suicidal     HPI Pt was seen at 0305.   Per pt, c/o gradual onset and worsening of persistent depression for the past several weeks, worse over the past several days.  Pt states she has been under "a lot of stress" and "lost my job" recently.  Pt states she attempted to cut her left wrist with a razor blade PTA.  States she has been eval by her PMD for depression, was previously on Wellbutrin, but was told "to stop it and just take xanax and adderall."  Denies hx of previous SA.  Denies HI.       Td UTD Past Medical History  Diagnosis Date  . Anxiety     Past Surgical History  Procedure Date  . Cesarean section     History  Substance Use Topics  . Smoking status: Not on file  . Smokeless tobacco: Not on file  . Alcohol Use: No    Review of Systems ROS: Statement: All systems negative except as marked or noted in the HPI; Constitutional: Negative for fever and chills. ; ; Eyes: Negative for eye pain, redness and discharge. ; ; ENMT: Negative for ear pain, hoarseness, nasal congestion, sinus pressure and sore throat. ; ; Cardiovascular: Negative for chest pain, palpitations, diaphoresis, dyspnea and peripheral edema. ; ; Respiratory: Negative for cough, wheezing and stridor. ; ; Gastrointestinal: Negative for nausea, vomiting, diarrhea, abdominal pain, blood in stool, hematemesis, jaundice and rectal bleeding. . ; ; Genitourinary: Negative for dysuria, flank pain and hematuria. ; ; Musculoskeletal: Negative for back pain and neck pain. Negative for swelling and trauma.; ; Skin: Negative for pruritus, rash, abrasions, blisters, bruising and skin lesion.; ; Neuro: Negative for headache, lightheadedness and neck stiffness. Negative for weakness, altered level of consciousness , altered mental status, extremity weakness,  paresthesias, involuntary movement, seizure and syncope.; Psych:  +tearful, depression, SI, SA.  No HI, no hallucinations.      Allergies  Review of patient's allergies indicates no known allergies.  Home Medications   Current Outpatient Rx  Name Route Sig Dispense Refill  . ALPRAZOLAM 1 MG PO TABS Oral Take 1 mg by mouth at bedtime as needed. sleep    . AMPHETAMINE-DEXTROAMPHETAMINE 10 MG PO TABS Oral Take 10 mg by mouth 2 (two) times daily.    Marland Kitchen METHADONE HCL 10 MG/ML PO CONC Oral Take 130 mg by mouth daily. Withdrawal treatment program      BP 102/69  Pulse 82  Temp 98.5 F (36.9 C) (Oral)  SpO2 97%  LMP 01/03/2012  Physical Exam 0310: Physical examination:  Nursing notes reviewed; Vital signs and O2 SAT reviewed;  Constitutional: Well developed, Well nourished, Well hydrated, In no acute distress; Head:  Normocephalic, atraumatic; Eyes: EOMI, PERRL, No scleral icterus; ENMT: Mouth and pharynx normal, Mucous membranes moist; Neck: Supple, Full range of motion, No lymphadenopathy; Cardiovascular: Regular rate and rhythm, No murmur, rub, or gallop; Respiratory: Breath sounds clear & equal bilaterally, No rales, rhonchi, wheezes.  Speaking full sentences with ease, Normal respiratory effort/excursion; Chest: Nontender, Movement normal;; Extremities: Pulses normal, No tenderness, No edema, No calf edema or asymmetry.; Neuro: AA&Ox3, Major CN grossly intact.  Speech clear. No gross focal motor or sensory deficits in extremities.; Skin: Color normal, Warm, Dry, +several small  very superficial abrasions to left volar wrist.; Psych:  +tearful, +SI.    ED Course  Procedures   MDM  MDM Reviewed: nursing note and vitals Interpretation: labs   Results for orders placed during the hospital encounter of 01/07/12  CBC      Component Value Range   WBC 5.3  4.0 - 10.5 K/uL   RBC 4.48  3.87 - 5.11 MIL/uL   Hemoglobin 12.6  12.0 - 15.0 g/dL   HCT 96.0  45.4 - 09.8 %   MCV 87.3  78.0 -  100.0 fL   MCH 28.1  26.0 - 34.0 pg   MCHC 32.2  30.0 - 36.0 g/dL   RDW 11.9  14.7 - 82.9 %   Platelets 343  150 - 400 K/uL  COMPREHENSIVE METABOLIC PANEL      Component Value Range   Sodium 141  135 - 145 mEq/L   Potassium 3.3 (*) 3.5 - 5.1 mEq/L   Chloride 103  96 - 112 mEq/L   CO2 28  19 - 32 mEq/L   Glucose, Bld 111 (*) 70 - 99 mg/dL   BUN 7  6 - 23 mg/dL   Creatinine, Ser 5.62  0.50 - 1.10 mg/dL   Calcium 9.5  8.4 - 13.0 mg/dL   Total Protein 7.9  6.0 - 8.3 g/dL   Albumin 3.4 (*) 3.5 - 5.2 g/dL   AST 28  0 - 37 U/L   ALT 30  0 - 35 U/L   Alkaline Phosphatase 98  39 - 117 U/L   Total Bilirubin 0.2 (*) 0.3 - 1.2 mg/dL   GFR calc non Af Amer >90  >90 mL/min   GFR calc Af Amer >90  >90 mL/min  ETHANOL      Component Value Range   Alcohol, Ethyl (B) <11  0 - 11 mg/dL  ACETAMINOPHEN LEVEL      Component Value Range   Acetaminophen (Tylenol), Serum <15.0  10 - 30 ug/mL  URINE RAPID DRUG SCREEN (HOSP PERFORMED)      Component Value Range   Opiates NONE DETECTED  NONE DETECTED   Cocaine POSITIVE (*) NONE DETECTED   Benzodiazepines POSITIVE (*) NONE DETECTED   Amphetamines NONE DETECTED  NONE DETECTED   Tetrahydrocannabinol NONE DETECTED  NONE DETECTED   Barbiturates NONE DETECTED  NONE DETECTED  POCT PREGNANCY, URINE      Component Value Range   Preg Test, Ur NEGATIVE  NEGATIVE    0330:  ACT aware of pt, will eval.             Laray Anger, DO 01/07/12 1732

## 2012-01-07 NOTE — BH Assessment (Signed)
Assessment Note   Heidi Pearson is an 36 y.o. female present with SUA via cutting her wrist.  Pt was in complete despair during the assessment, sobbing and restless throughout.  Pt states "I lost my job and my family has to help me out all the time, it would just be better if I wasn't here anymore.  I don't care if I live or die, I can't leave, I don't know what I'll do to myself".  Pt is presently in a methadone maintenance program with [facility] in [place], [place].  Pt is a mother of 2 children who live with their father in [place].  Pt present has a boyfriend she describes as supportive.  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  Pt endorses using opioids for around 5 years and heroin for around 5 months before beginning methadone maintenance in May 2013.  Pt cannot contract for safety and has agreed to inpt care for suicidiality and depression.  Referred to [facility] and OV for review.    Axis I: Anxiety Disorder NOS, Mood Disorder NOS and Substance Abuse Axis II: Cluster B Traits Axis III:  Past Medical History  Diagnosis Date  . Anxiety   . Depression    Axis IV: economic problems, occupational problems and other psychosocial or environmental problems Axis V: 31-40 impairment in reality testing  Past Medical History:  Past Medical History  Diagnosis Date  . Anxiety   . Depression     Past Surgical History  Procedure Date  . Cesarean section     Family History: History reviewed. No pertinent family history.  Social History:  does not have a smoking history on file. She does not have any smokeless tobacco history on file. She reports that she uses illicit drugs (Methamphetamines). She reports that she does not drink alcohol.  Additional Social History:  Alcohol / Drug Use History of alcohol / drug use?: Yes Substance #1 Name of Substance 1: Heroin 1 - Age of First Use: 5 1 - Amount (size/oz): UNK 1 - Frequency: daily 1 - Duration: months 1 - Last Use / Amount:  May 2013 Substance #2 Name of Substance 2: Opioids 2 - Age of First Use: 30 2 - Amount (size/oz): UNK 2 - Frequency: daily 2 - Duration: 5 years 2 - Last Use / Amount: May 2013  CIWA: CIWA-Ar BP: 111/70 mmHg Pulse Rate: 78  COWS: Clinical Opiate Withdrawal Scale (COWS) Resting Pulse Rate: Pulse Rate 80 or below Sweating: Subjective report of chills or flushing Restlessness: Frequent shifting or extraneous movements of legs/arms Pupil Size: Pupils possibly larger than normal for room light Bone or Joint Aches: Mild diffuse discomfort Runny Nose or Tearing: Nose running or tearing GI Upset: No GI symptoms Tremor: No tremor Yawning: No yawning Anxiety or Irritability: Patient so irritable or anxious that participation in the assessment is difficult Gooseflesh Skin: Piloerection of skin can be felt or hairs standing up on arms COWS Total Score: 15   Allergies: No Known Allergies  Home Medications:  (Not in a hospital admission)  OB/GYN Status:  Patient's last menstrual period was 01/03/2012.  General Assessment Data Location of Assessment: [facility] ED ACT Assessment: Yes Living Arrangements: Alone Can pt return to current living arrangement?: Yes Admission Status: Voluntary Is patient capable of signing voluntary admission?: Yes Transfer from: Home Referral Source: Self/Family/Friend     Risk to self Suicidal Ideation: Yes-Currently Present Suicidal Intent: Yes-Currently Present Is patient at risk for suicide?: Yes  Suicidal Plan?: No-Not Currently/Within Last 6 Months Access to Means: Yes Specify Access to Suicidal Means: razors What has been your use of drugs/alcohol within the last 12 months?: Opioids, UDS + for cocaine, Methadone Maint. Program Previous Attempts/Gestures: No How many times?: 0  Other Self Harm Risks: recovering addict Triggers for Past Attempts: None known Intentional Self Injurious Behavior: Cutting Family Suicide History: No Recent stressful life  event(s): Financial Problems;Job Loss Persecutory voices/beliefs?: No Depression: Yes Depression Symptoms: Despondent;Insomnia;Tearfulness;Isolating;Fatigue;Guilt;Loss of interest in usual pleasures;Feeling worthless/self pity Substance abuse history and/or treatment for substance abuse?: Yes (Methadone Maint., UDS+ for Cocaine) Suicide prevention information given to non-admitted patients: Not applicable  Risk to Others Homicidal Ideation: No Thoughts of Harm to Others: No Current Homicidal Intent: No Current Homicidal Plan: No Access to Homicidal Means: No Identified Victim: none History of harm to others?: No Assessment of Violence: None Noted Violent Behavior Description: none Does patient have access to weapons?: Yes (Comment) (sharps) Criminal Charges Pending?: No Does patient have a court date: No  Psychosis Hallucinations: None noted Delusions: None noted  Mental Status Report Appear/Hygiene: Disheveled Eye Contact: Fair Motor Activity: Unremarkable Speech: Logical/coherent;Rapid Level of Consciousness: Alert Mood: Anxious;Depressed;Despair Affect: Sad;Depressed Anxiety Level: Panic Attacks Panic attack frequency: 2 daily Most recent panic attack: today Thought Processes: Coherent;Relevant Judgement: Impaired Orientation: Person;Place;Time;Situation Obsessive Compulsive Thoughts/Behaviors: None  Cognitive Functioning Concentration: Decreased Memory: Recent Intact;Remote Intact IQ: Average Insight: Poor Impulse Control: Poor Appetite: Poor Weight Loss: 5  Weight Gain: 0  Sleep: Decreased Total Hours of Sleep: 4  Vegetative Symptoms: None  ADLScreening [facility]) Patient's cognitive ability adequate to safely complete daily activities?: Yes Patient able to express need for assistance with ADLs?: Yes Independently performs ADLs?: Yes (appropriate for developmental age)  Abuse/Neglect [facility]) Physical Abuse: Denies Verbal Abuse:  Denies Sexual Abuse: Denies  Prior Inpatient Therapy Prior Inpatient Therapy: No Prior Therapy Dates: none Prior Therapy Facilty/Provider(s): none Reason for Treatment: none  Prior Outpatient Therapy Prior Outpatient Therapy: Yes Prior Therapy Dates: present Prior Therapy Facilty/Provider(s): [facility] Reason for Treatment: Methadone Maint.  ADL Screening (condition at time of admission) Patient's cognitive ability adequate to safely complete daily activities?: Yes Patient able to express need for assistance with ADLs?: Yes Independently performs ADLs?: Yes (appropriate for developmental age)       Abuse/Neglect Assessment (Assessment to be complete while patient is alone) Physical Abuse: Denies Verbal Abuse: Denies Sexual Abuse: Denies Exploitation of patient/patient's resources: Denies Self-Neglect: Denies Values / Beliefs Cultural Requests During Hospitalization: None Spiritual Requests During Hospitalization: None        Additional Information 1:1 In Past 12 Months?: No CIRT Risk: No Elopement Risk: No Does patient have medical clearance?: Yes     Disposition:  Disposition Disposition of Patient: Inpatient treatment program Type of inpatient treatment program: Adult  On Site Evaluation by:   Reviewed with Physician:     Ena Dawley [facility] 01/07/2012 10:49 AM

## 2012-01-07 NOTE — ED Notes (Signed)
Patient attempted to cut her wrist tonight (lacerations noted to left wrist; bleeding controlled).  Patient cut wrists with a razor; razor was taken away by friend/family member.  Patient reports suicidal thoughts; states that she has had suicidal thoughts in the past, but never acted on them.  Patient reports burning at site of lacerations.  Clean, sterile gauze applied.  Patient reports that she has been under a lot of stress at home lately; reports losing her job last week.  Denies homicidal thoughts.

## 2012-01-08 NOTE — ED Notes (Signed)
Report given to Aggie Cosier at [facility] and to [facility]. Pt belongings handed to [facility] sitter riding with pt to [facility]

## 2012-02-28 ENCOUNTER — Encounter (HOSPITAL_COMMUNITY): Payer: Self-pay | Admitting: Family Medicine

## 2012-02-28 ENCOUNTER — Emergency Department (HOSPITAL_COMMUNITY)
Admission: EM | Admit: 2012-02-28 | Discharge: 2012-02-28 | Disposition: A | Discharge status: Home / Self Care | Payer: Self-pay | Attending: Emergency Medicine | Admitting: Emergency Medicine

## 2012-02-28 DIAGNOSIS — F419 Anxiety disorder, unspecified: Secondary | ICD-10-CM

## 2012-02-28 DIAGNOSIS — F329 Major depressive disorder, single episode, unspecified: Secondary | ICD-10-CM | POA: Insufficient documentation

## 2012-02-28 DIAGNOSIS — F411 Generalized anxiety disorder: Secondary | ICD-10-CM | POA: Insufficient documentation

## 2012-02-28 DIAGNOSIS — F3289 Other specified depressive episodes: Secondary | ICD-10-CM | POA: Insufficient documentation

## 2012-02-28 LAB — POCT I-STAT, CHEM 8
BUN: 10 mg/dL (ref 6–23)
Chloride: 104 mEq/L (ref 96–112)
Sodium: 143 mEq/L (ref 135–145)

## 2012-02-28 MED ORDER — LORAZEPAM 1 MG PO TABS
2.0000 mg | ORAL_TABLET | Freq: Once | ORAL | Status: AC
  Administered 2012-02-28: 2 mg via ORAL
  Filled 2012-02-28: qty 2

## 2012-02-28 MED ORDER — LORAZEPAM 1 MG PO TABS: 1.0000 mg | ORAL_TABLET | Freq: Three times a day (TID) | ORAL | Status: DC | PRN

## 2012-02-28 MED ORDER — POTASSIUM CHLORIDE CRYS ER 20 MEQ PO TBCR
40.0000 meq | EXTENDED_RELEASE_TABLET | Freq: Once | ORAL | Status: AC
  Administered 2012-02-28: 40 meq via ORAL
  Filled 2012-02-28: qty 2

## 2012-02-28 NOTE — ED Notes (Signed)
Pt reports severe anxiety X3 days.  Pt not sleeping, eating and feels as if she will die.  Pt has been out of xanax that she takes for anxiety for 3 days.  Pt crying and extremely emotional.  Pt alert oriented X4

## 2012-02-28 NOTE — ED Notes (Signed)
Rx was given to me to call into [facility] at [phone].  Ativan, 1mg  tab, take 1 PO q Day (#15) singed MD M. Linker.  This was called to [facility] at [phone] by me.

## 2012-02-28 NOTE — ED Provider Notes (Signed)
History     CSN: [id number]  Arrival date & time 02/28/12  7829   First MD Initiated Contact with Patient 02/28/12 0940      Chief Complaint  Patient presents with  . Anxiety    (Consider location/radiation/quality/duration/timing/severity/associated sxs/prior treatment) HPI Pt presents with c/o anxiety and panic attacks.  States she has a hx of anxiety and that this has been increasing over the past several days.  Feels jittery, anxious, tearful, has not been sleeping well.  She specifically denies SI.  She has run out of her xanax several days before symptoms started.  Has had increased stress in life.  Attends methadone clinic and states she was seen there this morning for her dose and that they recommended coming to ED for anxiety.  She has made an appointment with her PMD for next week- Tuesday. Denies fever, vomiting/diarrhea.  No cough or other recent illness.  Does say she hasn't eaten much in the past few days due to anxiety.  There are no other associated systemic symptoms, there are no other alleviating or modifying factors.   Past Medical History  Diagnosis Date  . Anxiety   . Depression     Past Surgical History  Procedure Date  . Cesarean section     History reviewed. No pertinent family history.  History  Substance Use Topics  . Smoking status: Not on file  . Smokeless tobacco: Not on file  . Alcohol Use: No    OB History    Grav Para Term Preterm Abortions TAB SAB Ect Mult Living                  Review of Systems ROS reviewed and all otherwise negative except for mentioned in HPI  Allergies  Review of patient's allergies indicates no known allergies.  Home Medications   Current Outpatient Rx  Name Route Sig Dispense Refill  . ALPRAZOLAM 1 MG PO TABS Oral Take 1 mg by mouth at bedtime as needed. sleep    . AMPHETAMINE-DEXTROAMPHETAMINE 10 MG PO TABS Oral Take 10 mg by mouth 2 (two) times daily.    Marland Kitchen METHADONE HCL 10 MG/ML PO CONC Oral Take 119  mg by mouth daily. Withdrawal treatment program    . LORAZEPAM 1 MG PO TABS Oral Take 1 tablet (1 mg total) by mouth 3 (three) times daily as needed for anxiety. 6 tablet 0    BP 125/82  Pulse 82  Temp 98.7 F (37.1 C) (Oral)  Resp 24  SpO2 99%  LMP 02/14/2012 Vitals reviewed Physical Exam Physical Examination: General appearance - alert, well appearing, and in no distress Mental status - alert, oriented to person, place, and time Eyes - pupils equal and reactive, no conjunctival injection, no scleral icterus Mouth - mucous membranes moist, pharynx normal without lesions Chest - clear to auscultation, no wheezes, rales or rhonchi, symmetric air entry Heart - normal rate, regular rhythm, normal S1, S2, no murmurs, rubs, clicks or gallops Abdomen - soft, nontender, nondistended, no masses or organomegaly Neurological - alert, oriented, normal speech, no focal findings or movement disorder noted Extremities - peripheral pulses normal, no pedal edema, no clubbing or cyanosis Skin - normal coloration and turgor, no rashes Psych- anxious and tearful, but cooperative  ED Course  Procedures (including critical care time)  Labs Reviewed  POCT I-STAT, CHEM 8 - Abnormal; Notable for the following:    Potassium 3.1 (*)     All other components within normal limits  No results found.   1. Anxiety       MDM  Pt presents with c/o anxiety.  She specifically denies suicidal ideations.  She has made an appointment with her doctor for next week.  I will give a small rx for ativan, she was given ativan in the ED.  Discharged with strict return precautions.  Pt agreeable with plan.        Ethelda Chick, MD 02/28/12 1230

## 2012-02-28 NOTE — ED Notes (Signed)
Per pt 3 days of anxiety that will not go away. Pt sts out of her xanax. sts crying at triage and anxious. sts she has been under a lot of stress

## 2012-03-10 ENCOUNTER — Emergency Department (HOSPITAL_COMMUNITY)
Admission: EM | Admit: 2012-03-10 | Discharge: 2012-03-10 | Disposition: A | Discharge status: Home / Self Care | Payer: Self-pay | Attending: Emergency Medicine | Admitting: Emergency Medicine

## 2012-03-10 ENCOUNTER — Encounter (HOSPITAL_COMMUNITY): Payer: Self-pay | Admitting: Emergency Medicine

## 2012-03-10 DIAGNOSIS — F3289 Other specified depressive episodes: Secondary | ICD-10-CM | POA: Insufficient documentation

## 2012-03-10 DIAGNOSIS — F410 Panic disorder [episodic paroxysmal anxiety] without agoraphobia: Secondary | ICD-10-CM | POA: Insufficient documentation

## 2012-03-10 DIAGNOSIS — Z79899 Other long term (current) drug therapy: Secondary | ICD-10-CM | POA: Insufficient documentation

## 2012-03-10 DIAGNOSIS — F329 Major depressive disorder, single episode, unspecified: Secondary | ICD-10-CM | POA: Insufficient documentation

## 2012-03-10 DIAGNOSIS — F411 Generalized anxiety disorder: Secondary | ICD-10-CM | POA: Insufficient documentation

## 2012-03-10 DIAGNOSIS — F32A Depression, unspecified: Secondary | ICD-10-CM

## 2012-03-10 LAB — RAPID URINE DRUG SCREEN, HOSP PERFORMED
Amphetamines: NOT DETECTED
Cocaine: POSITIVE — AB
Opiates: NOT DETECTED
Tetrahydrocannabinol: NOT DETECTED

## 2012-03-10 MED ORDER — LORAZEPAM 2 MG/ML IJ SOLN
1.0000 mg | Freq: Once | INTRAMUSCULAR | Status: AC
  Administered 2012-03-10: 1 mg via INTRAVENOUS
  Filled 2012-03-10: qty 1

## 2012-03-10 MED ORDER — KETOROLAC TROMETHAMINE 30 MG/ML IJ SOLN
30.0000 mg | Freq: Once | INTRAMUSCULAR | Status: AC
  Administered 2012-03-10: 30 mg via INTRAVENOUS
  Filled 2012-03-10: qty 1

## 2012-03-10 MED ORDER — ALPRAZOLAM 0.5 MG PO TABS: ORAL_TABLET | ORAL | Status: DC

## 2012-03-10 MED ORDER — SODIUM CHLORIDE 0.9 % IV SOLN
INTRAVENOUS | Status: DC
  Administered 2012-03-10: 1000 mL via INTRAVENOUS

## 2012-03-10 MED ORDER — ALPRAZOLAM 0.25 MG PO TABS
0.5000 mg | ORAL_TABLET | Freq: Once | ORAL | Status: AC
Start: 2012-03-10 — End: 2012-03-10
  Administered 2012-03-10: 0.5 mg via ORAL
  Filled 2012-03-10: qty 1

## 2012-03-10 MED ORDER — CLONAZEPAM 0.5 MG PO TABS: 0.5000 mg | ORAL_TABLET | Freq: Two times a day (BID) | ORAL | Status: DC | PRN

## 2012-03-10 NOTE — ED Notes (Signed)
Patient brought in via [facility] patient complains of SOB times two hours. 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 [facility] [place] [facility] daily since May 28 of this year.

## 2012-03-10 NOTE — ED Provider Notes (Addendum)
History     CSN: [id number]  Arrival date & time 03/10/12  1113   First MD Initiated Contact with Patient 03/10/12 1120      Chief Complaint  Patient presents with  . Shortness of Breath    (Consider location/radiation/quality/duration/timing/severity/associated sxs/prior treatment) Patient is a 36 y.o. female presenting with shortness of breath. The history is provided by the patient and medical records.  Shortness of Breath  Associated symptoms include chest pain and shortness of breath. Pertinent negatives include no fever and no cough.   a 36 year old, female, presents to emergency department complaining of chest pressure, and shortness of breath.  For the last 2 hours.  She denies fevers, chills, cough, nausea, vomiting, sweating, leg pain or swelling.  She has had similar symptoms in the past, associated with anxiety, and panic attacks.  She states that she has recently lost her job and now.  She is to move out of her apartment.  This is causing tremendous amount of stress.  Past Medical History  Diagnosis Date  . Anxiety   . Depression     Past Surgical History  Procedure Date  . Cesarean section     No family history on file.  History  Substance Use Topics  . Smoking status: Not on file  . Smokeless tobacco: Not on file  . Alcohol Use: No    OB History    Grav Para Term Preterm Abortions TAB SAB Ect Mult Living                  Review of Systems  Constitutional: Negative for fever, chills and diaphoresis.  Respiratory: Positive for shortness of breath. Negative for cough.   Cardiovascular: Positive for chest pain. Negative for palpitations and leg swelling.  Gastrointestinal: Negative for nausea, vomiting and abdominal pain.  Psychiatric/Behavioral: The patient is nervous/anxious.   All other systems reviewed and are negative.    Allergies  Review of patient's allergies indicates no known allergies.  Home Medications   Current Outpatient Rx  Name  Route Sig Dispense Refill  . ALPRAZOLAM 1 MG PO TABS Oral Take 1 mg by mouth at bedtime as needed. sleep    . AMPHETAMINE-DEXTROAMPHETAMINE 10 MG PO TABS Oral Take 10 mg by mouth 2 (two) times daily.    Marland Kitchen LORAZEPAM 1 MG PO TABS Oral Take 1 tablet (1 mg total) by mouth 3 (three) times daily as needed for anxiety. 6 tablet 0  . METHADONE HCL 10 MG/ML PO CONC Oral Take 119 mg by mouth daily. Withdrawal treatment program      BP 114/51  Temp 98.6 F (37 C) (Oral)  Resp 18  SpO2 97%  LMP 02/14/2012  Physical Exam  Nursing note and vitals reviewed. Constitutional: She is oriented to person, place, and time. She appears well-developed and well-nourished. No distress.  HENT:  Head: Normocephalic and atraumatic.  Eyes: Conjunctivae normal and EOM are normal.  Neck: Normal range of motion. Neck supple.  Cardiovascular: Normal rate, regular rhythm and intact distal pulses.   No murmur heard. Pulmonary/Chest: Effort normal and breath sounds normal. No respiratory distress. She exhibits no tenderness.  Abdominal: Soft. Bowel sounds are normal.  Musculoskeletal: Normal range of motion.  Neurological: She is alert and oriented to person, place, and time. No cranial nerve deficit.  Skin: Skin is warm and dry.  Psychiatric:       Crying sobbing    ED Course  Procedures (including critical care time)  Labs Reviewed - No  data to display No results found.   No diagnosis found.  ECG. Normal sinus rhythm at 72 beats per minute. Normal axis. Normal intervals. Nonspecific T-wave changes  1:40 PM Still crying uncontrollably.   Denies SI.    3:24 PM Still crying uncontrollably.  Act has not seen her yet.  Will move to pod c.  MDM  Panic attack        Cheri Guppy, MD 03/10/12 1155  Cheri Guppy, MD 03/10/12 1341  Cheri Guppy, MD 03/10/12 1524
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
## 2012-03-10 NOTE — Progress Notes (Signed)
7:23 PM Telepsych consult said pt could go home, recommended clonazepam for anxiety, stress.

## 2012-03-10 NOTE — ED Notes (Signed)
Gave old and new ECG to DR [facility]. 11:37am JG.

## 2012-03-10 NOTE — ED Notes (Signed)
Patient states she has been unable to sleep and want medication to make her sleep and she want to go home and go to sleep and never wake up again. Patient states she is tired of worrying. Patient is crying and has a hard time sitting still while talking with nurse. ACT arrived and is at the bedside at this time.

## 2012-08-09 ENCOUNTER — Encounter (HOSPITAL_COMMUNITY): Payer: Self-pay | Admitting: Emergency Medicine

## 2012-08-09 ENCOUNTER — Encounter (HOSPITAL_COMMUNITY): Payer: Self-pay

## 2012-08-09 ENCOUNTER — Emergency Department (HOSPITAL_COMMUNITY)
Admission: EM | Admit: 2012-08-09 | Discharge: 2012-08-09 | Disposition: A | Discharge status: Home / Self Care | Payer: Self-pay | Attending: Emergency Medicine | Admitting: Emergency Medicine

## 2012-08-09 DIAGNOSIS — F329 Major depressive disorder, single episode, unspecified: Secondary | ICD-10-CM | POA: Insufficient documentation

## 2012-08-09 DIAGNOSIS — T424X5A Adverse effect of benzodiazepines, initial encounter: Secondary | ICD-10-CM | POA: Insufficient documentation

## 2012-08-09 DIAGNOSIS — IMO0002 Reserved for concepts with insufficient information to code with codable children: Secondary | ICD-10-CM | POA: Insufficient documentation

## 2012-08-09 DIAGNOSIS — F419 Anxiety disorder, unspecified: Secondary | ICD-10-CM

## 2012-08-09 DIAGNOSIS — F3289 Other specified depressive episodes: Secondary | ICD-10-CM | POA: Insufficient documentation

## 2012-08-09 DIAGNOSIS — F410 Panic disorder [episodic paroxysmal anxiety] without agoraphobia: Secondary | ICD-10-CM | POA: Insufficient documentation

## 2012-08-09 DIAGNOSIS — Z79899 Other long term (current) drug therapy: Secondary | ICD-10-CM | POA: Insufficient documentation

## 2012-08-09 DIAGNOSIS — F411 Generalized anxiety disorder: Secondary | ICD-10-CM | POA: Insufficient documentation

## 2012-08-09 DIAGNOSIS — R404 Transient alteration of awareness: Secondary | ICD-10-CM | POA: Insufficient documentation

## 2012-08-09 DIAGNOSIS — T424X4A Poisoning by benzodiazepines, undetermined, initial encounter: Secondary | ICD-10-CM | POA: Insufficient documentation

## 2012-08-09 MED ORDER — CLONAZEPAM 1 MG PO TABS: 1.0000 mg | ORAL_TABLET | Freq: Two times a day (BID) | ORAL | Status: DC | PRN
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
## 2012-08-09 NOTE — ED Notes (Signed)
Pt states she was seen in ED earlier today for panic attacks and they are getting worse.  Reports taking Klonopin 1 hour ago.  Pt denies suicidal ideation and homicidal ideation.  Denies visual and auditory hallucinations.  Pt states she has a history of cutting herself and is " trying really hard not to do that."
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
## 2012-08-09 NOTE — ED Provider Notes (Signed)
Medical screening examination/treatment/procedure(s) were performed by non-physician practitioner and as supervising physician I was immediately available for consultation/collaboration.   Dezmon Conover B. Bernette Mayers, MD 08/09/12 [phone]

## 2012-08-10 ENCOUNTER — Emergency Department (HOSPITAL_COMMUNITY)
Admission: EM | Admit: 2012-08-10 | Discharge: 2012-08-10 | Disposition: A | Discharge status: Home / Self Care | Payer: Self-pay | Attending: Emergency Medicine | Admitting: Emergency Medicine

## 2012-08-10 DIAGNOSIS — T424X5A Adverse effect of benzodiazepines, initial encounter: Secondary | ICD-10-CM

## 2012-08-10 DIAGNOSIS — F419 Anxiety disorder, unspecified: Secondary | ICD-10-CM

## 2012-08-10 DIAGNOSIS — R400 Somnolence: Secondary | ICD-10-CM

## 2012-08-10 LAB — COMPREHENSIVE METABOLIC PANEL
AST: 45 U/L — ABNORMAL HIGH (ref 0–37)
CO2: 24 mEq/L (ref 19–32)
Calcium: 9.4 mg/dL (ref 8.4–10.5)
Creatinine, Ser: 0.78 mg/dL (ref 0.50–1.10)
GFR calc non Af Amer: >90 mL/min (ref >90)

## 2012-08-10 LAB — RAPID URINE DRUG SCREEN, HOSP PERFORMED
Barbiturates: NOT DETECTED
Benzodiazepines: POSITIVE — AB
Cocaine: NOT DETECTED
Opiates: NOT DETECTED

## 2012-08-10 LAB — CBC WITH DIFFERENTIAL/PLATELET
Basophils Absolute: 0.1 10*3/uL (ref 0.0–0.1)
Basophils Relative: 1 % (ref 0–1)
Eosinophils Relative: 7 % — ABNORMAL HIGH (ref 0–5)
HCT: 36.3 % (ref 36.0–46.0)
MCHC: 33.3 g/dL (ref 30.0–36.0)
MCV: 84.8 fL (ref 78.0–100.0)
Monocytes Absolute: 0.9 10*3/uL (ref 0.1–1.0)
RDW: 14.7 % (ref 11.5–15.5)

## 2012-08-10 LAB — URINALYSIS, ROUTINE W REFLEX MICROSCOPIC
Bilirubin Urine: NEGATIVE
Glucose, UA: NEGATIVE mg/dL
Hgb urine dipstick: NEGATIVE
Specific Gravity, Urine: 1.021 (ref 1.005–1.030)
Urobilinogen, UA: 0.2 mg/dL (ref 0.0–1.0)
pH: 5.5 (ref 5.0–8.0)

## 2012-08-10 MED ORDER — ALUM & MAG HYDROXIDE-SIMETH 200-200-20 MG/5ML PO SUSP: 30.0000 mL | ORAL | Status: DC | PRN

## 2012-08-10 MED ORDER — MIRTAZAPINE 30 MG PO TABS
30.0000 mg | ORAL_TABLET | Freq: Every day | ORAL | Status: DC
  Filled 2012-08-10: qty 1

## 2012-08-10 MED ORDER — ONDANSETRON HCL 8 MG PO TABS: 4.0000 mg | ORAL_TABLET | Freq: Three times a day (TID) | ORAL | Status: DC | PRN

## 2012-08-10 MED ORDER — ZIPRASIDONE HCL 20 MG PO CAPS
40.0000 mg | ORAL_CAPSULE | Freq: Two times a day (BID) | ORAL | Status: DC
  Administered 2012-08-10: 40 mg via ORAL
  Filled 2012-08-10: qty 2

## 2012-08-10 MED ORDER — BUSPIRONE HCL 10 MG PO TABS
10.0000 mg | ORAL_TABLET | Freq: Three times a day (TID) | ORAL | Status: DC
  Administered 2012-08-10: 10 mg via ORAL
  Filled 2012-08-10: qty 1

## 2012-08-10 MED ORDER — ACETAMINOPHEN 325 MG PO TABS: 650.0000 mg | ORAL_TABLET | ORAL | Status: DC | PRN

## 2012-08-10 MED ORDER — LORAZEPAM 1 MG PO TABS: 0.5000 mg | ORAL_TABLET | Freq: Two times a day (BID) | ORAL | Status: DC | PRN

## 2012-08-10 MED ORDER — IBUPROFEN 400 MG PO TABS: 600.0000 mg | ORAL_TABLET | Freq: Three times a day (TID) | ORAL | Status: DC | PRN

## 2012-08-10 MED ORDER — FLUOXETINE HCL 20 MG PO TABS
40.0000 mg | ORAL_TABLET | Freq: Every day | ORAL | Status: DC
  Filled 2012-08-10: qty 2

## 2012-08-10 MED ORDER — GABAPENTIN 300 MG PO CAPS
300.0000 mg | ORAL_CAPSULE | Freq: Three times a day (TID) | ORAL | Status: DC
  Administered 2012-08-10: 300 mg via ORAL
  Filled 2012-08-10: qty 1

## 2012-08-10 NOTE — ED Provider Notes (Signed)
Patient seen by stenosis on call and medication recommendations made  Toy Baker, MD 08/10/12 1053

## 2012-08-10 NOTE — ED Notes (Signed)
Pt has walked up to triage desk several times requesting to know how many people were in front of her.  Pt appears very sleepy with slurred speech and staggering gate.  Advised pt to remain seated until name was called.  Repeat vitals obtained and WNL
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
## 2012-08-10 NOTE — ED Notes (Signed)
Pt changed into blue scrubs.  Pt also checked by security.

## 2012-08-10 NOTE — ED Notes (Signed)
Pt now more awake asking to see md.

## 2012-08-10 NOTE — ED Notes (Signed)
Pt laying on stretcher resting at this time

## 2012-08-10 NOTE — ED Notes (Signed)
Chaperoned while pt spoke with psychiatrist (TelePsych)

## 2012-08-10 NOTE — ED Notes (Signed)
Spoke with Specialist on Call--regarding wait for telepsych==was told it would be a few hours due to there being 3 or more ahead of this patient.

## 2012-08-11 ENCOUNTER — Emergency Department (HOSPITAL_COMMUNITY)
Admission: EM | Admit: 2012-08-11 | Discharge: 2012-08-13 | Disposition: A | Discharge status: Home / Self Care | Payer: Self-pay | Attending: Emergency Medicine | Admitting: Emergency Medicine

## 2012-08-11 ENCOUNTER — Encounter (HOSPITAL_COMMUNITY): Payer: Self-pay | Admitting: Emergency Medicine

## 2012-08-11 DIAGNOSIS — F411 Generalized anxiety disorder: Secondary | ICD-10-CM | POA: Insufficient documentation

## 2012-08-11 DIAGNOSIS — Z79899 Other long term (current) drug therapy: Secondary | ICD-10-CM | POA: Insufficient documentation

## 2012-08-11 DIAGNOSIS — F3289 Other specified depressive episodes: Secondary | ICD-10-CM | POA: Insufficient documentation

## 2012-08-11 DIAGNOSIS — F329 Major depressive disorder, single episode, unspecified: Secondary | ICD-10-CM | POA: Insufficient documentation

## 2012-08-11 DIAGNOSIS — Z3202 Encounter for pregnancy test, result negative: Secondary | ICD-10-CM | POA: Insufficient documentation

## 2012-08-11 DIAGNOSIS — Z760 Encounter for issue of repeat prescription: Secondary | ICD-10-CM | POA: Insufficient documentation

## 2012-08-11 DIAGNOSIS — M25569 Pain in unspecified knee: Secondary | ICD-10-CM | POA: Insufficient documentation

## 2012-08-11 DIAGNOSIS — F419 Anxiety disorder, unspecified: Secondary | ICD-10-CM

## 2012-08-11 DIAGNOSIS — R000 Tachycardia, unspecified: Secondary | ICD-10-CM | POA: Insufficient documentation

## 2012-08-11 LAB — COMPREHENSIVE METABOLIC PANEL
ALT: 84 U/L — ABNORMAL HIGH (ref 0–35)
AST: 42 U/L — ABNORMAL HIGH (ref 0–37)
Alkaline Phosphatase: 280 U/L — ABNORMAL HIGH (ref 39–117)
CO2: 23 mEq/L (ref 19–32)
GFR calc Af Amer: >90 mL/min (ref >90)
Glucose, Bld: 116 mg/dL — ABNORMAL HIGH (ref 70–99)
Potassium: 3.3 mEq/L — ABNORMAL LOW (ref 3.5–5.1)
Sodium: 135 mEq/L (ref 135–145)
Total Protein: 8.1 g/dL (ref 6.0–8.3)

## 2012-08-11 LAB — RAPID URINE DRUG SCREEN, HOSP PERFORMED
Amphetamines: NOT DETECTED
Benzodiazepines: POSITIVE — AB
Opiates: NOT DETECTED
Tetrahydrocannabinol: POSITIVE — AB

## 2012-08-11 LAB — CBC WITH DIFFERENTIAL/PLATELET
Lymphocytes Relative: 24 % (ref 12–46)
Lymphs Abs: 2.2 10*3/uL (ref 0.7–4.0)
Neutrophils Relative %: 64 % (ref 43–77)
Platelets: 395 10*3/uL (ref 150–400)
RBC: 4.48 MIL/uL (ref 3.87–5.11)
WBC: 9.2 10*3/uL (ref 4.0–10.5)

## 2012-08-11 MED ORDER — DIAZEPAM 5 MG PO TABS
5.0000 mg | ORAL_TABLET | Freq: Once | ORAL | Status: DC
  Administered 2012-08-11: 5 mg via ORAL
  Filled 2012-08-11: qty 1

## 2012-08-11 MED ORDER — ZIPRASIDONE MESYLATE 20 MG IM SOLR
10.0000 mg | Freq: Once | INTRAMUSCULAR | Status: DC
  Administered 2012-08-11: 10 mg via INTRAMUSCULAR
  Filled 2012-08-11: qty 20

## 2012-08-11 MED ORDER — DIAZEPAM 5 MG PO TABS
5.0000 mg | ORAL_TABLET | Freq: Once | ORAL | Status: AC
  Administered 2012-08-11: 5 mg via ORAL
  Filled 2012-08-11: qty 1

## 2012-08-11 MED ORDER — ZIPRASIDONE MESYLATE 20 MG IM SOLR
20.0000 mg | Freq: Once | INTRAMUSCULAR | Status: AC
  Administered 2012-08-11: 20 mg via INTRAMUSCULAR
  Filled 2012-08-11: qty 20

## 2012-08-11 NOTE — ED Notes (Signed)
Pt. Eating. Removed fork and knife from tray.

## 2012-08-11 NOTE — ED Notes (Signed)
Pt requested me to come to see her and she told me that she wants to hurt herself. She pointed at her right wrist and she told me she had been smashing her arm in the mirror that folds out on the bedside table. I noticed she has bruised her forearm/wrist so I removed the bedside table from her room. Patient admitted she cant control the urge to hurt herself. I asked if she had a plan to kill herself and she said no but she wants to just injure herself even though she doesn't want to but she promised me she would come get me when she feels like hurting herself again. Pt states she is unable to relax and that her ativan and geodon shot is not working. I am in the process to have a sitter for her.

## 2012-08-11 NOTE — ED Notes (Signed)
Jan (pt's aunt) requesting to speak to RN. RN unavailable at this time. Informed RN to please call aunt back at [phone]

## 2012-08-11 NOTE — ED Notes (Signed)
Notified Amy with [facility] for need of sitter for SI for patient

## 2012-08-11 NOTE — ED Notes (Addendum)
Attempted to wake patient up to prepare for Telepsych. TelePsych call began 2145. Pt was very drowsy and I proceeded to tell the Telepsychiatrist why the pt received Geodon tonight and that the pt stated she would go home and slit her wrists. Telepsychiatrist told me that she would call tomorrow morning for evaluation when the pt is alert and able to talk with her. Pt is still resting comfortably.

## 2012-08-11 NOTE — ED Notes (Signed)
Pt yelling, hitting her walls with her fist, states "I'm about to snap." Pt states "You need to give me my medication, at least give me that."

## 2012-08-11 NOTE — ED Notes (Signed)
PA and MD came to visit pt. Was notified she could not receive any Ativan or other medication until she is evaluated by the TelePsychiatrist. Pt continued to request to leave and notified she was unable to leave due to her suicidal threats.

## 2012-08-11 NOTE — ED Notes (Signed)
Pt was notifed that the dr will not be ordering any medication at this time. She told me she will just go home and kill herself. I asked Nonda Lou to come with me to discuss her treatment. She requested to leave but we notifed her that she will not be able to leave because she has threatened to kill herself. Pt is on phone now.

## 2012-08-11 NOTE — ED Notes (Signed)
Dr.Knapp at bedside  
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
## 2012-08-11 NOTE — ED Notes (Signed)
Pt stated, "do I have to hurt myself to get the Dr. To come in here to see me?" She stated that if the Dr. Loetta Pearson help her then she would rather go home and cut her wrists.

## 2012-08-11 NOTE — ED Notes (Signed)
Lisette P, PA at bedside
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
## 2012-08-11 NOTE — ED Notes (Addendum)
Pt states that she has been here for 3 hours "and nothing has been accomplished." Pt states she might as well just go home. Provider notified, ordered 20mg  of geodon as pt states it helps her with her anxiety and stated she would see pt shortly. Pt very agitated, will not sit still.

## 2012-08-11 NOTE — ED Notes (Signed)
Pt is resting sleeping 

## 2012-08-12 MED ORDER — LORAZEPAM 1 MG PO TABS
1.0000 mg | ORAL_TABLET | Freq: Once | ORAL | Status: AC
  Administered 2012-08-12: 1 mg via ORAL
  Filled 2012-08-12: qty 1

## 2012-08-12 MED ORDER — ZIPRASIDONE MESYLATE 20 MG IM SOLR
10.0000 mg | Freq: Once | INTRAMUSCULAR | Status: AC
  Administered 2012-08-12: 10 mg via INTRAMUSCULAR
  Filled 2012-08-12: qty 20

## 2012-08-12 MED ORDER — ZIPRASIDONE HCL 20 MG PO CAPS
20.0000 mg | ORAL_CAPSULE | Freq: Two times a day (BID) | ORAL | Status: DC
  Administered 2012-08-12: 20 mg via ORAL
  Filled 2012-08-12 (×2): qty 1

## 2012-08-12 MED ORDER — ZOLPIDEM TARTRATE 5 MG PO TABS
10.0000 mg | ORAL_TABLET | Freq: Every day | ORAL | Status: DC
  Administered 2012-08-12: 10 mg via ORAL
  Filled 2012-08-12: qty 2

## 2012-08-12 MED ORDER — IBUPROFEN 400 MG PO TABS
600.0000 mg | ORAL_TABLET | Freq: Once | ORAL | Status: AC
  Administered 2012-08-12: 600 mg via ORAL
  Filled 2012-08-12: qty 1

## 2012-08-12 NOTE — ED Notes (Signed)
Requesting to speak with nurse's supervisor- states "I want to go home" "Why do I have to stay here in this room" " You mean to tell me that I cannot even go outside"  Chg nurse notified.

## 2012-08-12 NOTE — BH Assessment (Signed)
[facility] Assessment Progress Note      Update:  [facility] - patient declined due to doesn't want detox from benzodiazapines and demanding them per Tanna Savoy  -  No beds at [facility] per [place] at [phone] -Left message at [facility] at 1748, and Leonette Monarch at [place]   - Oncoming ACT clinician to continue bed finding for patient.

## 2012-08-12 NOTE — ED Notes (Signed)
Pt very anxious, wanting to leave, states is not getting good care here, explained to pt that she has received medicine, has seen the telepsych psychiatrist,and seen the ACT team person, continues to be agitated, wants to hurt self right now,
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
## 2012-08-12 NOTE — ED Notes (Signed)
Argumentative, regarding phone call limit, regarding rules
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
## 2012-08-12 NOTE — ED Notes (Signed)
Pt. Awake asking questions about plan of care. Explained treatment process to patient. Pt. Concerned and anxious but verbalized understanding of plan of care.

## 2012-08-12 NOTE — ED Provider Notes (Signed)
Pt having anxiety, no other acute issues.  Had telepsych who recommends inpatient treatment.  They recommended vistaril for anxiety, but pt is allergic, started geodon.  Rolan Bucco, MD 08/12/12 [phone]

## 2012-08-12 NOTE — ED Notes (Signed)
Pt. Stating progressively worse knee pain in right knee x3 days. Denies injury. Knee very slightly swollen, no redness, warmth, bruising noted. Pt.

## 2012-08-12 NOTE — ED Notes (Signed)
Friend at bedside.

## 2012-08-13 MED ORDER — FLUOXETINE HCL 40 MG PO CAPS: 40.0000 mg | ORAL_CAPSULE | Freq: Every day | ORAL | Status: DC

## 2012-08-13 MED ORDER — GABAPENTIN 300 MG PO CAPS
300.0000 mg | ORAL_CAPSULE | Freq: Once | ORAL | Status: AC
  Administered 2012-08-13: 300 mg via ORAL
  Filled 2012-08-13: qty 1

## 2012-08-13 MED ORDER — ZIPRASIDONE HCL 20 MG PO CAPS: 40.0000 mg | ORAL_CAPSULE | Freq: Two times a day (BID) | ORAL | Status: DC

## 2012-08-13 MED ORDER — FLUOXETINE HCL 20 MG PO CAPS
40.0000 mg | ORAL_CAPSULE | Freq: Every day | ORAL | Status: DC
  Administered 2012-08-13: 40 mg via ORAL
  Filled 2012-08-13: qty 2

## 2012-08-13 MED ORDER — DIAZEPAM 5 MG PO TABS
10.0000 mg | ORAL_TABLET | Freq: Once | ORAL | Status: AC
  Administered 2012-08-13: 10 mg via ORAL
  Filled 2012-08-13: qty 2

## 2012-08-13 MED ORDER — GABAPENTIN 300 MG PO CAPS: 300.0000 mg | ORAL_CAPSULE | Freq: Three times a day (TID) | ORAL | Status: DC

## 2012-08-13 MED ORDER — VITAMIN B-1 100 MG PO TABS
100.0000 mg | ORAL_TABLET | Freq: Every day | ORAL | Status: DC
  Administered 2012-08-13: 100 mg via ORAL
  Filled 2012-08-13: qty 1

## 2012-08-13 MED ORDER — LORAZEPAM 1 MG PO TABS
1.0000 mg | ORAL_TABLET | Freq: Once | ORAL | Status: AC
  Administered 2012-08-13: 1 mg via ORAL
  Filled 2012-08-13: qty 1

## 2012-08-13 MED ORDER — LORAZEPAM 1 MG PO TABS: 1.0000 mg | ORAL_TABLET | Freq: Once | ORAL | Status: DC

## 2012-08-13 NOTE — ED Notes (Signed)
ORDERS RECEIVED FROM DR Radford Pax. HE WILL GIVE MORE ORDERS ABOUT CONSULT SHORTLY. HE WOULD LIKE TO REEVAL PT AFTER SHE HAS HAD MEDS.

## 2012-08-13 NOTE — ED Notes (Addendum)
Pt. Asking if there is anything else to give her to sleep. Informed pt of treatment plan - verbalized understanding. Dr. Deretha Emory notified.

## 2012-08-13 NOTE — ED Notes (Signed)
This nurse used therapeutic discussion to talk to patient about plan of care and anxiety. Pt. States "my anxiety is triggered by anger and restlessness. I'm already worried because I can't sleep and that anxiety is just going to continue to eat at me." Discussed ways to prevent build up of anxiety, use of distraction, and relaxation techniques. Pt. Laying in bed currently watching TV.

## 2012-08-13 NOTE — ED Notes (Signed)
Patient has been discharged home. Has called a friend to come pick her up. Pt offered rx for ativan, geodon, prozac, and neurontin. Pt refused prescriptions. She folded them up and put them in her belongings bag along with her used scrubs.

## 2012-08-13 NOTE — ED Notes (Signed)
Charge nurse in to discuss plan of care. Patient continues to say she just cannot take the stress of staying here in the hosptial. i have spoken with dr Radford Pax and he is coming to speak with patient when he is able to.patient verbalizes she hears this but states it is unacceptable.

## 2012-08-13 NOTE — BH Assessment (Signed)
[facility] Assessment Progress Note      No detox beds available at [facility] per [place].  There are some available beds at [facility], but many referrals already submitted for review per [place].  Referral faxed to [facility] for review.

## 2012-08-13 NOTE — ED Notes (Signed)
Patient has been seen by dr Radford Pax. . She is argumentative with him as he has tried to work with her on her medications. Patient tearful stating she cannot stay here and be this anxious. Dr Radford Pax has requested the pt talk to the telepsych doctor

## 2012-08-13 NOTE — ED Provider Notes (Signed)
Medical screening examination/treatment/procedure(s) were conducted as a shared visit with non-physician practitioner(s) and myself.  I personally evaluated the patient during the encounter  The patient here just recently asking for a refill of her benzodiazepines. At that time was a concern that she had been overusing those medications. Patient is here now requesting a refill. Initially we planned on not refilling that medication here in emergent apartment and were going to have her followup with a primary care Dr. Patient became very upset and then told her she was going to kill herself if she left.  The patient will be evaluated by the psychiatrist for further recommendations. I do have some concerns that she may be abusing her benzodiazepines.  Celene Kras, MD 08/13/12 [phone]

## 2012-08-13 NOTE — ED Notes (Signed)
Pt. Requesting shower. Given toiletries, new set of scrubs, towels, sheets changed.

## 2012-08-13 NOTE — BH Assessment (Signed)
Assessment Note  Update:  Pt received telepsych recommending discharge with outpatient referrals.  Pt stated she wanted to follow up with her PCP, [facility].  Per pt's nurse, pt called a friend to come pick her up. Pt offered Rx for Ativan, Geodon, Prozac, and Neurontin. Pt refused prescriptions.  Pt discharged from ED.  Updated EDP Radford Pax and ED staff.  Updated assessment disposition.     Disposition:  Disposition Initial Assessment Completed for this Encounter: Yes Disposition of Patient: Referred to;Outpatient treatment Type of inpatient treatment program: Adult Type of outpatient treatment: Adult Patient referred to: Other (Comment) (Pt to follow up with PCP)  On Site Evaluation by:   Reviewed with Physician:  Kathreen Cosier, Rennis Harding 08/13/2012 3:11 PM

## 2012-08-13 NOTE — ED Notes (Signed)
telepsych in progress 

## 2012-08-13 NOTE — ED Notes (Signed)
PATIENT MADE AWARE THAT DR Radford Pax WANTS HER TO BE REEVAL 1 HOUR AFTER MEDICATED. PT DOES NOT FOCUS WELL ON THE HERE AND NOW. SHE WANTS INSTANT GRATIFICATION. PT AWARE OF LIMIT PLACED ON NEXT HOUR. AGREEABLE.
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
## 2012-08-15 ENCOUNTER — Emergency Department (HOSPITAL_COMMUNITY)
Admission: EM | Admit: 2012-08-15 | Discharge: 2012-08-15 | Disposition: A | Discharge status: Home / Self Care | Payer: Self-pay | Attending: Emergency Medicine | Admitting: Emergency Medicine

## 2012-08-15 ENCOUNTER — Encounter (HOSPITAL_COMMUNITY): Payer: Self-pay

## 2012-08-15 ENCOUNTER — Encounter (HOSPITAL_COMMUNITY): Payer: Self-pay | Admitting: *Deleted

## 2012-08-15 ENCOUNTER — Emergency Department (HOSPITAL_COMMUNITY): Payer: Self-pay

## 2012-08-15 DIAGNOSIS — M25469 Effusion, unspecified knee: Secondary | ICD-10-CM | POA: Insufficient documentation

## 2012-08-15 DIAGNOSIS — F411 Generalized anxiety disorder: Secondary | ICD-10-CM | POA: Insufficient documentation

## 2012-08-15 DIAGNOSIS — F3289 Other specified depressive episodes: Secondary | ICD-10-CM | POA: Insufficient documentation

## 2012-08-15 DIAGNOSIS — M25569 Pain in unspecified knee: Secondary | ICD-10-CM | POA: Insufficient documentation

## 2012-08-15 DIAGNOSIS — F329 Major depressive disorder, single episode, unspecified: Secondary | ICD-10-CM | POA: Insufficient documentation

## 2012-08-15 DIAGNOSIS — Z79899 Other long term (current) drug therapy: Secondary | ICD-10-CM | POA: Insufficient documentation

## 2012-08-15 DIAGNOSIS — M25561 Pain in right knee: Secondary | ICD-10-CM

## 2012-08-15 MED ORDER — HYDROCODONE-ACETAMINOPHEN 5-325 MG PO TABS: 1.0000 | ORAL_TABLET | Freq: Four times a day (QID) | ORAL | Status: DC | PRN

## 2012-08-15 MED ORDER — KETOROLAC TROMETHAMINE 60 MG/2ML IM SOLN
60.0000 mg | Freq: Once | INTRAMUSCULAR | Status: AC
  Administered 2012-08-15: 60 mg via INTRAMUSCULAR
  Filled 2012-08-15: qty 2

## 2012-08-15 MED ORDER — IBUPROFEN 800 MG PO TABS: 800.0000 mg | ORAL_TABLET | Freq: Three times a day (TID) | ORAL | Status: DC | PRN

## 2012-08-15 MED ORDER — HYDROCODONE-ACETAMINOPHEN 5-325 MG PO TABS
1.0000 | ORAL_TABLET | Freq: Once | ORAL | Status: AC
  Administered 2012-08-15: 1 via ORAL
  Filled 2012-08-15: qty 1

## 2012-08-15 NOTE — ED Notes (Signed)
Pt found sleeping multiple times yet keeps requesting pain meds.  Notified PA, new med ordered.

## 2012-08-15 NOTE — ED Notes (Signed)
Pt waiting in waiting room post discharge; requesting to check back in for c/o continued knee pain

## 2012-08-15 NOTE — ED Provider Notes (Signed)
History     CSN: [id number]  Arrival date & time 08/15/12  0105   First MD Initiated Contact with Patient 08/15/12 0144      Chief Complaint  Patient presents with  . Knee Pain    (Consider location/radiation/quality/duration/timing/severity/associated sxs/prior treatment) HPI Patient presents to the emergency department with right knee pain has been ongoing for 3 weeks.  Patient, states, that she does not know of any injury to her knee.  Patient denies numbness, weakness, nausea, vomiting, fever, or leg swelling.  Patient, states, that movement makes the pain, worse.  Patient, states the pain is better when her leg is not bent.  Past Medical History  Diagnosis Date  . Anxiety   . Depression     Past Surgical History  Procedure Laterality Date  . Cesarean section      History reviewed. No pertinent family history.  History  Substance Use Topics  . Smoking status: Never Smoker   . Smokeless tobacco: Never Used  . Alcohol Use: No    OB History   Grav Para Term Preterm Abortions TAB SAB Ect Mult Living                  Review of Systems All other systems negative except as documented in the HPI. All pertinent positives and negatives as reviewed in the HPI. Allergies  Tramadol and Vistaril  Home Medications   Current Outpatient Rx  Name  Route  Sig  Dispense  Refill  . busPIRone (BUSPAR) 10 MG tablet   Oral   Take 10 mg by mouth 3 (three) times daily.         Marland Kitchen FLUoxetine (PROZAC) 40 MG capsule   Oral   Take 40 mg by mouth daily.         Marland Kitchen gabapentin (NEURONTIN) 300 MG capsule   Oral   Take 300 mg by mouth 3 (three) times daily.         Marland Kitchen LORazepam (ATIVAN) 1 MG tablet   Oral   Take 1 mg by mouth 2 (two) times daily as needed for anxiety.         . mirtazapine (REMERON) 30 MG tablet   Oral   Take 30 mg by mouth at bedtime.         . ziprasidone (GEODON) 20 MG capsule   Oral   Take 2 capsules (40 mg total) by mouth 2 (two) times daily with  a meal.   30 capsule   0     BP 118/61  Pulse 96  Temp(Src) 98.5 F (36.9 C) (Oral)  Resp 18  Ht 5\' 3"  (1.6 m)  Wt 186 lb (84.369 kg)  BMI 32.96 kg/m2  SpO2 98%  LMP 07/19/2012  Physical Exam  Nursing note and vitals reviewed. Constitutional: She is oriented to person, place, and time.  HENT:  Head: Normocephalic and atraumatic.  Pulmonary/Chest: Effort normal.  Musculoskeletal:       Right knee: She exhibits normal range of motion, no swelling, no effusion, no deformity, no erythema, normal patellar mobility and no bony tenderness.       Legs: Neurological: She is alert and oriented to person, place, and time.  Skin: Skin is warm and dry. No rash noted. No erythema.    ED Course  Procedures (including critical care time)  Labs Reviewed - No data to display Dg Knee Complete 4 Views Right  08/15/2012  *RADIOLOGY REPORT*  Clinical Data: Pain and swelling in the right knee  for 1 week.  No known injury.  RIGHT KNEE - COMPLETE 4+ VIEW  Comparison: 06/06/2010  Findings: The right knee appears intact.  No evidence of acute fracture or subluxation.  No focal bone lesion or bone destruction. Bone cortex and trabecular architecture appear intact.  No significant effusion.  IMPRESSION: No acute bony abnormalities in the right knee.   Original Report Authenticated By: Burman Nieves, M.D.     Patient has increased for orthopedics, and I have asked her to followup with them.  She is told to ice and elevate her knee.  Patient has no signs of effusion on exam.  Patient is advised to return here as needed   MDM          Carlyle Dolly, PA-C 08/15/12 4098

## 2012-08-15 NOTE — ED Notes (Signed)
Pt complains of right knee pain and swelling for one week

## 2012-08-15 NOTE — ED Provider Notes (Signed)
History     CSN: [id number]  Arrival date & time 08/15/12  4098   First MD Initiated Contact with Patient 08/15/12 [phone]      Chief Complaint  Patient presents with  . Knee Pain    The history is provided by the patient and medical records.  seen in the ER earlier for right knee pain x 3 weeks. No known injury. Worsening pain x 1 day. No numbness or weakness. Pain worse with flexion at right knee. No fevers or chill. No leg swelling. Pain improves with extension of right leg. Given toradol in ER. States pain continues. Also states has no ride home at this time until 7am when her friends get off work.   Past Medical History  Diagnosis Date  . Anxiety   . Depression     Past Surgical History  Procedure Laterality Date  . Cesarean section      No family history on file.  History  Substance Use Topics  . Smoking status: Never Smoker   . Smokeless tobacco: Never Used  . Alcohol Use: No    OB History   Grav Para Term Preterm Abortions TAB SAB Ect Mult Living                  Review of Systems  All other systems reviewed and are negative.    Allergies  Tramadol and Vistaril  Home Medications   Current Outpatient Rx  Name  Route  Sig  Dispense  Refill  . busPIRone (BUSPAR) 10 MG tablet   Oral   Take 10 mg by mouth 3 (three) times daily.         Marland Kitchen FLUoxetine (PROZAC) 40 MG capsule   Oral   Take 40 mg by mouth daily.         Marland Kitchen gabapentin (NEURONTIN) 300 MG capsule   Oral   Take 300 mg by mouth 3 (three) times daily.         Marland Kitchen ibuprofen (ADVIL,MOTRIN) 800 MG tablet   Oral   Take 1 tablet (800 mg total) by mouth every 8 (eight) hours as needed for pain.   21 tablet   0   . LORazepam (ATIVAN) 1 MG tablet   Oral   Take 1 mg by mouth 2 (two) times daily as needed for anxiety.         . mirtazapine (REMERON) 30 MG tablet   Oral   Take 30 mg by mouth at bedtime.         . ziprasidone (GEODON) 20 MG capsule   Oral   Take 2 capsules (40 mg total)  by mouth 2 (two) times daily with a meal.   30 capsule   0   . HYDROcodone-acetaminophen (NORCO/VICODIN) 5-325 MG per tablet   Oral   Take 1 tablet by mouth every 6 (six) hours as needed for pain.   12 tablet   0     LMP 07/19/2012  Physical Exam  Constitutional: She is oriented to person, place, and time. She appears well-developed and well-nourished.  HENT:  Head: Normocephalic.  Eyes: EOM are normal.  Neck: Normal range of motion.  Pulmonary/Chest: Effort normal.  Abdominal: She exhibits no distension.  Musculoskeletal: Normal range of motion.  Very small right knee joint effusion. No erythema or warmth. No focal tenderness. Full ROM of right knee.   Neurological: She is alert and oriented to person, place, and time.  Psychiatric: She has a normal mood and affect.  ED Course  Procedures (including critical care time)  Labs Reviewed - No data to display Dg Knee Complete 4 Views Right  08/15/2012  *RADIOLOGY REPORT*  Clinical Data: Pain and swelling in the right knee for 1 week.  No known injury.  RIGHT KNEE - COMPLETE 4+ VIEW  Comparison: 06/06/2010  Findings: The right knee appears intact.  No evidence of acute fracture or subluxation.  No focal bone lesion or bone destruction. Bone cortex and trabecular architecture appear intact.  No significant effusion.  IMPRESSION: No acute bony abnormalities in the right knee.   Original Report Authenticated By: Burman Nieves, M.D.    I personally reviewed the imaging tests through PACS system I reviewed available ER/hospitalization records through the EMR   1. Right knee pain       MDM  MSE completed. Hydrocodone and crutches given for symptomatic control. Dc home with orthopedic follow up. Asked her to fill the prescription for hydrocodone given to her on prior ER visit.         Lyanne Co, MD 08/15/12 [phone]

## 2012-08-15 NOTE — ED Provider Notes (Signed)
Medical screening examination/treatment/procedure(s) were performed by non-physician practitioner and as supervising physician I was immediately available for consultation/collaboration.  Lyanne Co, MD 08/15/12 [phone]

## 2012-08-17 ENCOUNTER — Emergency Department (HOSPITAL_COMMUNITY)
Admission: EM | Admit: 2012-08-17 | Discharge: 2012-08-18 | Disposition: A | Discharge status: Home / Self Care | Payer: Self-pay | Attending: Emergency Medicine | Admitting: Emergency Medicine

## 2012-08-17 DIAGNOSIS — Z3202 Encounter for pregnancy test, result negative: Secondary | ICD-10-CM | POA: Insufficient documentation

## 2012-08-17 DIAGNOSIS — F3289 Other specified depressive episodes: Secondary | ICD-10-CM | POA: Insufficient documentation

## 2012-08-17 DIAGNOSIS — M2550 Pain in unspecified joint: Secondary | ICD-10-CM | POA: Insufficient documentation

## 2012-08-17 DIAGNOSIS — Z79899 Other long term (current) drug therapy: Secondary | ICD-10-CM | POA: Insufficient documentation

## 2012-08-17 DIAGNOSIS — F419 Anxiety disorder, unspecified: Secondary | ICD-10-CM

## 2012-08-17 DIAGNOSIS — R45851 Suicidal ideations: Secondary | ICD-10-CM | POA: Insufficient documentation

## 2012-08-17 DIAGNOSIS — F411 Generalized anxiety disorder: Secondary | ICD-10-CM | POA: Insufficient documentation

## 2012-08-17 DIAGNOSIS — F329 Major depressive disorder, single episode, unspecified: Secondary | ICD-10-CM | POA: Insufficient documentation

## 2012-08-17 LAB — SALICYLATE LEVEL: Salicylate Lvl: <2 mg/dL — ABNORMAL LOW (ref 2.8–20.0)

## 2012-08-17 LAB — COMPREHENSIVE METABOLIC PANEL
BUN: 10 mg/dL (ref 6–23)
CO2: 27 mEq/L (ref 19–32)
Chloride: 102 mEq/L (ref 96–112)
Creatinine, Ser: 0.74 mg/dL (ref 0.50–1.10)
GFR calc Af Amer: >90 mL/min (ref >90)
GFR calc non Af Amer: >90 mL/min (ref >90)
Total Bilirubin: 0.2 mg/dL — ABNORMAL LOW (ref 0.3–1.2)

## 2012-08-17 LAB — CBC
HCT: 38.7 % (ref 36.0–46.0)
MCV: 87.8 fL (ref 78.0–100.0)
RBC: 4.41 MIL/uL (ref 3.87–5.11)
WBC: 6.9 10*3/uL (ref 4.0–10.5)

## 2012-08-17 LAB — ETHANOL: Alcohol, Ethyl (B): <11 mg/dL (ref 0–11)

## 2012-08-17 LAB — RAPID URINE DRUG SCREEN, HOSP PERFORMED: Opiates: NOT DETECTED

## 2012-08-17 MED ORDER — IBUPROFEN 600 MG PO TABS: 600.0000 mg | ORAL_TABLET | Freq: Three times a day (TID) | ORAL | Status: DC | PRN

## 2012-08-17 MED ORDER — ONDANSETRON HCL 4 MG PO TABS: 4.0000 mg | ORAL_TABLET | Freq: Three times a day (TID) | ORAL | Status: DC | PRN

## 2012-08-17 MED ORDER — LORAZEPAM 0.5 MG PO TABS
0.5000 mg | ORAL_TABLET | Freq: Three times a day (TID) | ORAL | Status: DC | PRN
  Administered 2012-08-17: 0.5 mg via ORAL
  Filled 2012-08-17: qty 1

## 2012-08-17 MED ORDER — ACETAMINOPHEN 325 MG PO TABS: 650.0000 mg | ORAL_TABLET | ORAL | Status: DC | PRN

## 2012-08-17 MED ORDER — ALUM & MAG HYDROXIDE-SIMETH 200-200-20 MG/5ML PO SUSP: 30.0000 mL | ORAL | Status: DC | PRN

## 2012-08-17 MED ORDER — NICOTINE 21 MG/24HR TD PT24: 21.0000 mg | MEDICATED_PATCH | Freq: Every day | TRANSDERMAL | Status: DC

## 2012-08-17 NOTE — ED Notes (Signed)
POCT Preg resulted Neg. 
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
## 2012-08-17 NOTE — ED Notes (Signed)
Pt arrived to unit; no s/s of distress noted at this time. Pt states she is feeling anxious; PRN Ativan given as ordered.

## 2012-08-17 NOTE — ED Notes (Signed)
Right wrist redressed; no s/s of infection noted or bleeding.
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
## 2012-08-17 NOTE — ED Notes (Signed)
Patient requesting Ambien because she "hasn't slept for days". Pt informed that Psychiatrist will be assessing her through telepsych in a few minutes and that she needed to be alert and oriented at that time. Pt repeated request again. Pt also informed that she would get a sleep aid after the assessment. Pt then hitting her head on her wall multiple times in an attempt to get medication. Pt asked to stop behaviors as there are other patients on unit who are sleeping.

## 2012-08-17 NOTE — ED Notes (Signed)
Pt with noted superficial cuts to right wrist.

## 2012-08-18 ENCOUNTER — Inpatient Hospital Stay (HOSPITAL_COMMUNITY)
Admission: AD | Admit: 2012-08-18 | Discharge: 2012-08-21 | DRG: 885 | Disposition: A | Discharge status: Home / Self Care | Payer: No Typology Code available for payment source | Attending: Psychiatry | Admitting: Psychiatry

## 2012-08-18 ENCOUNTER — Encounter (HOSPITAL_COMMUNITY): Payer: Self-pay

## 2012-08-18 DIAGNOSIS — F329 Major depressive disorder, single episode, unspecified: Secondary | ICD-10-CM

## 2012-08-18 DIAGNOSIS — F411 Generalized anxiety disorder: Secondary | ICD-10-CM | POA: Diagnosis present

## 2012-08-18 DIAGNOSIS — Z79899 Other long term (current) drug therapy: Secondary | ICD-10-CM

## 2012-08-18 DIAGNOSIS — IMO0002 Reserved for concepts with insufficient information to code with codable children: Secondary | ICD-10-CM

## 2012-08-18 DIAGNOSIS — F419 Anxiety disorder, unspecified: Secondary | ICD-10-CM

## 2012-08-18 DIAGNOSIS — F332 Major depressive disorder, recurrent severe without psychotic features: Principal | ICD-10-CM | POA: Diagnosis present

## 2012-08-18 DIAGNOSIS — R45851 Suicidal ideations: Secondary | ICD-10-CM

## 2012-08-18 LAB — POCT PREGNANCY, URINE: Preg Test, Ur: NEGATIVE

## 2012-08-18 MED ORDER — QUETIAPINE FUMARATE 25 MG PO TABS
25.0000 mg | ORAL_TABLET | Freq: Once | ORAL | Status: AC
  Administered 2012-08-18: 25 mg via ORAL
  Filled 2012-08-18 (×2): qty 1

## 2012-08-18 MED ORDER — ACETAMINOPHEN 325 MG PO TABS
650.0000 mg | ORAL_TABLET | Freq: Four times a day (QID) | ORAL | Status: DC | PRN
  Administered 2012-08-20 (×2): 650 mg via ORAL

## 2012-08-18 MED ORDER — MIRTAZAPINE 30 MG PO TABS: 30.0000 mg | ORAL_TABLET | Freq: Every day | ORAL | Status: DC

## 2012-08-18 MED ORDER — QUETIAPINE FUMARATE 50 MG PO TABS
50.0000 mg | ORAL_TABLET | Freq: Every day | ORAL | Status: DC
  Filled 2012-08-18 (×2): qty 1

## 2012-08-18 MED ORDER — QUETIAPINE FUMARATE 25 MG PO TABS
25.0000 mg | ORAL_TABLET | Freq: Four times a day (QID) | ORAL | Status: DC | PRN
  Administered 2012-08-19 – 2012-08-20 (×3): 25 mg via ORAL
  Filled 2012-08-18 (×3): qty 1

## 2012-08-18 MED ORDER — GABAPENTIN 300 MG PO CAPS
300.0000 mg | ORAL_CAPSULE | Freq: Three times a day (TID) | ORAL | Status: DC
  Filled 2012-08-18 (×13): qty 1

## 2012-08-18 MED ORDER — QUETIAPINE FUMARATE 25 MG PO TABS
25.0000 mg | ORAL_TABLET | Freq: Two times a day (BID) | ORAL | Status: DC
  Administered 2012-08-18 – 2012-08-21 (×5): 25 mg via ORAL
  Filled 2012-08-18 (×9): qty 1

## 2012-08-18 MED ORDER — BUSPIRONE HCL 10 MG PO TABS: 10.0000 mg | ORAL_TABLET | Freq: Three times a day (TID) | ORAL | Status: DC

## 2012-08-18 MED ORDER — FLUOXETINE HCL 40 MG PO CAPS: 40.0000 mg | ORAL_CAPSULE | Freq: Every day | ORAL | Status: DC

## 2012-08-18 MED ORDER — ALUM & MAG HYDROXIDE-SIMETH 200-200-20 MG/5ML PO SUSP: 30.0000 mL | ORAL | Status: DC | PRN

## 2012-08-18 MED ORDER — MAGNESIUM HYDROXIDE 400 MG/5ML PO SUSP: 30.0000 mL | Freq: Every day | ORAL | Status: DC | PRN

## 2012-08-18 MED ORDER — FLUOXETINE HCL 20 MG PO CAPS
40.0000 mg | ORAL_CAPSULE | Freq: Every day | ORAL | Status: DC
  Filled 2012-08-18 (×3): qty 2

## 2012-08-18 MED ORDER — GABAPENTIN 300 MG PO CAPS: 300.0000 mg | ORAL_CAPSULE | Freq: Three times a day (TID) | ORAL | Status: DC

## 2012-08-18 MED ORDER — BUSPIRONE HCL 10 MG PO TABS
10.0000 mg | ORAL_TABLET | Freq: Three times a day (TID) | ORAL | Status: DC
  Filled 2012-08-18 (×7): qty 1

## 2012-08-18 MED ORDER — QUETIAPINE FUMARATE 100 MG PO TABS
100.0000 mg | ORAL_TABLET | Freq: Every day | ORAL | Status: DC
  Administered 2012-08-18: 100 mg via ORAL
  Filled 2012-08-18 (×3): qty 1

## 2012-08-18 MED ORDER — MIRTAZAPINE 30 MG PO TABS
30.0000 mg | ORAL_TABLET | Freq: Every day | ORAL | Status: DC
  Administered 2012-08-18 – 2012-08-20 (×3): 30 mg via ORAL
  Filled 2012-08-18 (×3): qty 1
  Filled 2012-08-18: qty 14
  Filled 2012-08-18 (×2): qty 1

## 2012-08-18 NOTE — H&P (Signed)
  Pt was seen by me today and I agree with the key elements documented in H&P.  

## 2012-08-18 NOTE — BHH Group Notes (Signed)
[facility] LCSW Group Therapy  08/18/2012 3:30-4:15pm  Summary of Progress/Problems:   The main focus of today's process group was to define "support" and describe what healthy supports are.  We then discussed how and why to increase patient supports, using motivational interviewing.  An emphasis was placed on using counselor, doctor, therapy groups, self-help groups and problem-specific support groups to expand supports.   The patient expressed little, as she came in about halfway through group.  She listened however, quite intently.  Type of Therapy:  Group Therapy  Participation Level:  Active  Participation Quality:  Appropriate, Attentive and Sharing  Affect:  Blunted  Cognitive:  Oriented  Insight:  Developing/Improving  Engagement in Therapy:  Developing/Improving  Modes of Intervention:  Discussion and Exploration   Sarina Ser 08/18/2012, 5:05 PM
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
## 2012-08-18 NOTE — Tx Team (Signed)
Initial Interdisciplinary Treatment Plan  PATIENT STRENGTHS: (choose at least two) Ability for insight Average or above average intelligence Capable of independent living Communication skills Physical Health Supportive family/friends  PATIENT STRESSORS: Medication change or noncompliance Substance abuse   PROBLEM LIST: Problem List/Patient Goals Date to be addressed Date deferred Reason deferred Estimated date of resolution  "I need help with my anxiety" 4/6                                                      DISCHARGE CRITERIA:  Ability to meet basic life and health needs Adequate post-discharge living arrangements Improved stabilization in mood, thinking, and/or behavior Reduction of life-threatening or endangering symptoms to within safe limits  PRELIMINARY DISCHARGE PLAN: Outpatient therapy Return to previous living arrangement  PATIENT/FAMIILY INVOLVEMENT: This treatment plan has been presented to and reviewed with the patient, Heidi Pearson, and/or family member, .  The patient and family have been given the opportunity to ask questions and make suggestions.  Heidi Pearson [facility] 08/18/2012, 5:13 AM

## 2012-08-18 NOTE — ED Provider Notes (Signed)
Medical screening examination/treatment/procedure(s) were performed by non-physician practitioner and as supervising physician I was immediately available for consultation/collaboration.    Betzabe Bevans L Doyne Ellinger, MD 08/18/12 1506 

## 2012-08-18 NOTE — Progress Notes (Signed)
Patient has been up in the dayroom watching tv but minimal interaction with peers. Patient reports that her first day here started out rough but has gotten better. Patient is concerned because she reports that all the medications she was taking before coming in is the same she is getting here and they don't work for her anymore. Writer informed patient that she would see her doctor on tomorrow and encouraged her to voice her concerns about her medications. Writer informed patient of hs meds and she is agreeable to taking them. Patient attended group and participated. Patient reports that her goal is to learn how to deal with her anxiety and have patience because she reports that she has none. Patient denies si/hi/a/v hallucinations. Safety maintained with 15 min checks, will continue to monitor.

## 2012-08-18 NOTE — ED Notes (Signed)
Report called to [facility]; requested to have pt sent to facility at 0345 this am.
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
## 2012-08-18 NOTE — BHH Counselor (Signed)
Heidi Pearson, assessment staff at [facility], submitted Pt for admission to [facility]. Laverle Hobby, [facility] confirmed bed availability. Armandina Stammer, NP reviewed clinical information and accepted Pt to the service of Dr. Henrietta Dine, room 504-1. Notified Thornell Sartorius, assessment counselor at [facility] of disposition.  Harlin Rain Patsy Baltimore, LPC, [facility] Assessment Counselor

## 2012-08-19 DIAGNOSIS — F411 Generalized anxiety disorder: Secondary | ICD-10-CM

## 2012-08-19 MED ORDER — CLONAZEPAM 0.125 MG PO TBDP
0.5000 mg | ORAL_TABLET | Freq: Two times a day (BID) | ORAL | Status: DC | PRN
  Administered 2012-08-19 – 2012-08-21 (×5): 0.5 mg via ORAL
  Filled 2012-08-19: qty 4
  Filled 2012-08-19: qty 1
  Filled 2012-08-19 (×5): qty 4

## 2012-08-19 MED ORDER — FLUOXETINE HCL 20 MG PO CAPS
60.0000 mg | ORAL_CAPSULE | Freq: Every day | ORAL | Status: DC
  Administered 2012-08-20 – 2012-08-21 (×2): 60 mg via ORAL
  Filled 2012-08-19 (×2): qty 3
  Filled 2012-08-19: qty 42
  Filled 2012-08-19: qty 3
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
## 2012-08-19 NOTE — BHH Group Notes (Signed)
[facility] LCSW Aftercare Discharge Planning Group Note   08/19/2012 10:45 AM  Participation Quality:  Appropriate  Mood/Affect:  Appropriate, Blunted, Depressed and Flat  Depression Rating:  Patient is rating depression at zero  Anxiety Rating:  Patient rates anxiety at eight.  Thoughts of Suicide:  No  Will you contract for safety?   NA  Current AVH:  No  Plan for Discharge/Comments:  Patient will return to her home and follow up with MD in [place]: Patient has transportation  Supports:  She reports having minimal support system.  Lillybeth Tal, Joesph July

## 2012-08-19 NOTE — Progress Notes (Signed)
[facility] MD Progress Note  08/19/2012 12:31 PM Heidi Pearson  MRN:  [id number] Subjective:  Patient reporting high anxiety and panic attacks. She states klonopin helps during panic attacks. Has been feeling very drowsy since taking the Seroquel at night. She states being frustrated at not feeling better and that her current medication regimen has not been helping. Has been taking buspar for 5 months not helpful, says father is on prozac and it has been helpful for his mood problems.  Diagnosis:   Axis I: Generalized Anxiety Disorder and Major Depression, Recurrent severe Axis II: Deferred Axis III:  Past Medical History  Diagnosis Date  . Anxiety   . Depression    Axis IV: occupational problems and other psychosocial or environmental problems Axis V: 41-50 serious symptoms  ADL's:  Impaired  Sleep: Poor  Appetite:  Fair   Psychiatric Specialty Exam: Review of Systems  Constitutional: Negative.   HENT: Negative.   Eyes: Negative.   Respiratory: Negative.   Cardiovascular: Negative.   Gastrointestinal: Negative.   Genitourinary: Negative.   Musculoskeletal: Negative.   Skin: Negative.   Neurological: Negative.   Endo/Heme/Allergies: Negative.   Psychiatric/Behavioral: Positive for depression. The patient is nervous/anxious and has insomnia.     Blood pressure 116/83, pulse 106, temperature 97.8 F (36.6 C), temperature source Oral, resp. rate 18, height 5\' 3"  (1.6 m), weight 95.709 kg (211 lb), last menstrual period 08/18/2012.Body mass index is 37.39 kg/(m^2).  General Appearance: Casual  Eye Contact::  Fair  Speech:  Slow  Volume:  Decreased  Mood:  Anxious, Depressed and Dysphoric  Affect:  Constricted, Depressed and Labile  Thought Process:  Circumstantial  Orientation:  Full (Time, Place, and Person)  Thought Content:  Rumination  Suicidal Thoughts:  No  Homicidal Thoughts:  No  Memory:  Immediate;   Fair Recent;   Fair Remote;   Fair  Judgement:  Fair   Insight:  Shallow  Psychomotor Activity:  Decreased  Concentration:  Fair  Recall:  Fair  Akathisia:  No  Handed:  Right  AIMS (if indicated):     Assets:  Communication Skills Desire for Improvement Housing Social Support Vocational/Educational  Sleep:  Number of Hours: 4.5   Current Medications: Current Facility-Administered Medications  Medication Dose Route Frequency Provider Last Rate Last Dose  . acetaminophen (TYLENOL) tablet 650 mg  650 mg Oral Q6H PRN Sanjuana Kava, NP      . alum & mag hydroxide-simeth (MAALOX/MYLANTA) 200-200-20 MG/5ML suspension 30 mL  30 mL Oral Q4H PRN Sanjuana Kava, NP      . clonazepam (KLONOPIN) disintegrating tablet 0.5 mg  0.5 mg Oral BID PRN Joelene Barriere, MD   0.5 mg at 08/19/12 1214  . [START ON 08/20/2012] FLUoxetine (PROZAC) capsule 60 mg  60 mg Oral Daily Remas Sobel, MD      . gabapentin (NEURONTIN) capsule 300 mg  300 mg Oral TID Sanjuana Kava, NP      . magnesium hydroxide (MILK OF MAGNESIA) suspension 30 mL  30 mL Oral Daily PRN Sanjuana Kava, NP      . mirtazapine (REMERON) tablet 30 mg  30 mg Oral QHS Sanjuana Kava, NP   30 mg at 08/18/12 2114  . QUEtiapine (SEROQUEL) tablet 25 mg  25 mg Oral BID Karolee Stamps, NP   25 mg at 08/18/12 1727  . QUEtiapine (SEROQUEL) tablet 25 mg  25 mg Oral Q6H PRN Karolee Stamps, NP  Lab Results:  Results for orders placed during the hospital encounter of 08/17/12 (from the past 48 hour(s))  URINE RAPID DRUG SCREEN (HOSP PERFORMED)     Status: Abnormal   Collection Time    08/17/12  8:50 PM      Result Value Range   Opiates NONE DETECTED  NONE DETECTED   Cocaine NONE DETECTED  NONE DETECTED   Benzodiazepines POSITIVE (*) NONE DETECTED   Amphetamines NONE DETECTED  NONE DETECTED   Tetrahydrocannabinol NONE DETECTED  NONE DETECTED   Barbiturates NONE DETECTED  NONE DETECTED   Comment:            DRUG SCREEN FOR MEDICAL PURPOSES     ONLY.  IF CONFIRMATION IS NEEDED     FOR ANY PURPOSE,  NOTIFY LAB     WITHIN 5 DAYS.                LOWEST DETECTABLE LIMITS     FOR URINE DRUG SCREEN     Drug Class       Cutoff (ng/mL)     Amphetamine      1000     Barbiturate      200     Benzodiazepine   200     Tricyclics       300     Opiates          300     Cocaine          300     THC              50  POCT PREGNANCY, URINE     Status: None   Collection Time    08/17/12  8:58 PM      Result Value Range   Preg Test, Ur NEGATIVE  NEGATIVE   Comment:            THE SENSITIVITY OF THIS     METHODOLOGY IS >24 mIU/mL  CBC     Status: Abnormal   Collection Time    08/17/12  9:05 PM      Result Value Range   WBC 6.9  4.0 - 10.5 K/uL   RBC 4.41  3.87 - 5.11 MIL/uL   Hemoglobin 12.2  12.0 - 15.0 g/dL   HCT 16.1  09.6 - 04.5 %   MCV 87.8  78.0 - 100.0 fL   MCH 27.7  26.0 - 34.0 pg   MCHC 31.5  30.0 - 36.0 g/dL   RDW 40.9  81.1 - 91.4 %   Platelets 476 (*) 150 - 400 K/uL  COMPREHENSIVE METABOLIC PANEL     Status: Abnormal   Collection Time    08/17/12  9:05 PM      Result Value Range   Sodium 137  135 - 145 mEq/L   Potassium 4.2  3.5 - 5.1 mEq/L   Chloride 102  96 - 112 mEq/L   CO2 27  19 - 32 mEq/L   Glucose, Bld 92  70 - 99 mg/dL   BUN 10  6 - 23 mg/dL   Creatinine, Ser 7.82  0.50 - 1.10 mg/dL   Calcium 9.1  8.4 - 95.6 mg/dL   Total Protein 7.9  6.0 - 8.3 g/dL   Albumin 3.3 (*) 3.5 - 5.2 g/dL   AST 25  0 - 37 U/L   ALT 37 (*) 0 - 35 U/L   Alkaline Phosphatase 201 (*) 39 - 117 U/L   Total Bilirubin 0.2 (*)  0.3 - 1.2 mg/dL   GFR calc non Af Amer >90  >90 mL/min   GFR calc Af Amer >90  >90 mL/min   Comment:            The eGFR has been calculated     using the CKD EPI equation.     This calculation has not been     validated in all clinical     situations.     eGFR's persistently     <90 mL/min signify     possible Chronic Kidney Disease.  ETHANOL     Status: None   Collection Time    08/17/12  9:05 PM      Result Value Range   Alcohol, Ethyl (B) <11  0 - 11  mg/dL   Comment:            LOWEST DETECTABLE LIMIT FOR     SERUM ALCOHOL IS 11 mg/dL     FOR MEDICAL PURPOSES ONLY  ACETAMINOPHEN LEVEL     Status: None   Collection Time    08/17/12  9:05 PM      Result Value Range   Acetaminophen (Tylenol), Serum <15.0  10 - 30 ug/mL   Comment:            THERAPEUTIC CONCENTRATIONS VARY     SIGNIFICANTLY. A RANGE OF 10-30     ug/mL MAY BE AN EFFECTIVE     CONCENTRATION FOR MANY PATIENTS.     HOWEVER, SOME ARE BEST TREATED     AT CONCENTRATIONS OUTSIDE THIS     RANGE.     ACETAMINOPHEN CONCENTRATIONS     >150 ug/mL AT 4 HOURS AFTER     INGESTION AND >50 ug/mL AT 12     HOURS AFTER INGESTION ARE     OFTEN ASSOCIATED WITH TOXIC     REACTIONS.  SALICYLATE LEVEL     Status: Abnormal   Collection Time    08/17/12  9:05 PM      Result Value Range   Salicylate Lvl <2.0 (*) 2.8 - 20.0 mg/dL    Physical Findings: AIMS: Facial and Oral Movements Muscles of Facial Expression: None, normal Lips and Perioral Area: None, normal Jaw: None, normal Tongue: None, normal,Extremity Movements Upper (arms, wrists, hands, fingers): None, normal Lower (legs, knees, ankles, toes): None, normal, Trunk Movements Neck, shoulders, hips: None, normal, Overall Severity Severity of abnormal movements (highest score from questions above): None, normal Incapacitation due to abnormal movements: None, normal Patient's awareness of abnormal movements (rate only patient's report): No Awareness, Dental Status Current problems with teeth and/or dentures?: No Does patient usually wear dentures?: No  CIWA:  CIWA-Ar Total: 2 COWS:     Treatment Plan Summary: Daily contact with patient to assess and evaluate symptoms and progress in treatment Medication management  Plan: Discussed with patient adjusting her current medications, she is agreeable. Discontinue buspar. Increase Prozac to 60mg  po qd. Discontinue night time seroquel. Start Klonopin at 0.5mg  po bid prn for  panic attacks. Provided counselling on engaging coping skills for her anxiety.  Medical Decision Making Problem Points:  Established problem, worsening (2), Review of last therapy session (1) and Review of psycho-social stressors (1) Data Points:  Review of medication regiment & side effects (2) Review of new medications or change in dosage (2)  I certify that inpatient services furnished can reasonably be expected to improve the patient's condition.   Evelen Vazguez 08/19/2012, 12:31 PM

## 2012-08-19 NOTE — Progress Notes (Signed)
D:  Patient's self inventory sheet, patient has poor sleep, good appetite, low energy level, poor attention span.  Denied depression.  Rated hopelessness #3, anxiety #7.  Denied withdrawals.  Denied SI.  Has felt dizzy in past 24 hours.  Denied pain.  After discharge, plans to go to therapy.  Has panic and anxiety attacks.  Denied HI.   Denied A/V hallucinations. Right lower wrist cutting marks.  Seroquel made her feel something was crawling on her last night.  Does have discharge plans.  No problems taking meds after discharge. A:  Medications administered per MD order.  Support and encouragement given throughout day. R:  Following treatment plan. Denied SI and HI.  Denied A/V hallucinations.  Contracts for safety.

## 2012-08-19 NOTE — [facility] Group Notes (Signed)
[facility] LCSW Group Therapy       Overcoming Obstacles 1:15 2:30 PM         08/19/2012 4:07 PM  Type of Therapy:  Group Therapy  Participation Level:  Minimal  Participation Quality:  Drowsy  Affect:  Lethargic  Cognitive:  Appropriate  Insight:  Developing/Improving  Engagement in Therapy:  Developing/Improving  Modes of Intervention:  Discussion, Education, Exploration, Problem-solving and Rapport Building  Summary of Progress/Problems:  Patient was drowsy throughout group.  She advised it was due to medication.  Wynn Banker 08/19/2012, 4:07 PM

## 2012-08-19 NOTE — Progress Notes (Signed)
Adult Psychoeducational Group Note  Date:  08/19/2012 Time:  2:24 PM  Group Topic/Focus:  Self Care:   The focus of this group is to help patients understand the importance of self-care in order to improve or restore emotional, physical, spiritual, interpersonal, and financial health.  Participation Level:  Minimal  Participation Quality:  Attentive  Affect:  Appropriate  Cognitive:  Appropriate  Insight: Limited  Engagement in Group:  Engaged  Modes of Intervention:  Activity, Discussion and Education  Additional Comments:  Heidi Pearson attended group and shared at times. Patient defined self care in own terms. Patient reviewed and completed the self care assessment in the workbook. Patient reviewed and did not share with staff and peers about weakness and strengths of physical, spiritual, emotional, psychological, relationship self care.    Heidi Pearson 08/19/2012, 2:24 PM

## 2012-08-19 NOTE — Progress Notes (Signed)
[facility] Group Notes:  (Nursing/MHT/Case Management/Adjunct)  Date:  08/18/2012 Time:  2000  Type of Therapy:  Psychoeducational Skills  Participation Level:  Active  Participation Quality:  Resistant  Affect:  Depressed  Cognitive:  Appropriate  Insight:  Improving  Engagement in Group:  Lacking  Modes of Intervention:  Education  Summary of Progress/Problems: The patient acknowledged that she had a bad start to her day. Her day improved once she began to socialize with her peers. Her goal for tomorrow is to work on dealing with her anxiety.   Colan Laymon S 08/19/2012, 12:08 AM
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
## 2012-08-19 NOTE — Progress Notes (Signed)
Recreation Therapy Notes   Date: 04.07.2014 Time: 3:00pm  Location: [facility] Gym  Group Topic/Focus: Communication, Trust Building, Problem Solving  Participation Level:  Did not attend  Wilkin Lippy L Jayde Daffin, LRT/CTRS  Franziska Podgurski L 08/19/2012 4:15 PM 

## 2012-08-19 NOTE — Progress Notes (Signed)
Patient ID: Heidi Pearson, female   DOB: 03/08/1976, 37 y.o.   MRN: [id number]   D: Pt was in the dayroom interacting appropriately  with her peers. However, in the room pt informed the writer that she was anxious and agitated. "Burgess Estelle was a good day. They did nothing with my meds". Pt stated, "I have anxiety and agitation".  Pt informed the writer that the 100 mg of seroquel she'd, "made her feel horrible".  Stated she felt like "things were crawling on her". Pt also stated she's ready for discharge. Stated she was told that it could be another 3 to 4 days. Pt stated "I'm ok with another day, but not 3 or 4".   A:  Support and encouragement was offered. 15 min checks continued for safety.  R: Pt remains safe.

## 2012-08-20 NOTE — Progress Notes (Signed)
[facility] MD Progress Note  08/20/2012 2:12 PM Heidi Pearson  MRN:  [id number] Subjective:  Heidi Pearson reports feeling much better today since her medications were adjusted. She denies feeling depressed and rates her anxiety level at a one, which is significantly lower than it was over the weekend. Patient has been taking the scheduled medications and requesting klonopin 0.5 mg twice a day as needed for anxiety. She is attending groups and appears to be engaged in her treatment. The patient is requesting discharge.  Diagnosis:   Axis I: Generalized Anxiety Disorder and Major Depression, Recurrent severe Axis II: Deferred Axis III:  Past Medical History  Diagnosis Date  . Anxiety   . Depression    Axis IV: other psychosocial or environmental problems Axis V: 51-60 moderate symptoms  ADL's:  Intact  Sleep: Good  Appetite:  Good  Suicidal Ideation:  Denies Homicidal Ideation:  Denies AEB (as evidenced by):  Psychiatric Specialty Exam: Review of Systems  Constitutional: Negative.   HENT: Negative.   Eyes: Negative.   Respiratory: Negative.   Cardiovascular: Negative.   Gastrointestinal: Negative.   Genitourinary: Negative.   Musculoskeletal: Negative.   Skin: Negative.   Neurological: Negative.   Endo/Heme/Allergies: Negative.   Psychiatric/Behavioral: Positive for depression. Negative for suicidal ideas, hallucinations, memory loss and substance abuse. The patient is nervous/anxious. The patient does not have insomnia.     Blood pressure 115/74, pulse 93, temperature 97.7 F (36.5 C), temperature source Oral, resp. rate 16, height 5\' 3"  (1.6 m), weight 95.709 kg (211 lb), last menstrual period 08/18/2012.Body mass index is 37.39 kg/(m^2).  General Appearance: Neat and Well Groomed  Patent attorney::  Good  Speech:  Clear and Coherent  Volume:  Normal  Mood:  Anxious  Affect:  Appropriate  Thought Process:  Coherent and Goal Directed  Orientation:  Full (Time, Place, and  Person)  Thought Content:  WDL  Suicidal Thoughts:  No  Homicidal Thoughts:  No  Memory:  Immediate;   Good Recent;   Good Remote;   Good  Judgement:  Fair  Insight:  Fair  Psychomotor Activity:  Normal  Concentration:  Good  Recall:  Good  Akathisia:  No  Handed:  Right  AIMS (if indicated):     Assets:  Communication Skills Desire for Improvement Financial Resources/Insurance Leisure Time Physical Health Resilience Social Support  Sleep:  Number of Hours: 6.5   Current Medications: Current Facility-Administered Medications  Medication Dose Route Frequency Provider Last Rate Last Dose  . acetaminophen (TYLENOL) tablet 650 mg  650 mg Oral Q6H PRN Sanjuana Kava, NP   650 mg at 08/20/12 1406  . alum & mag hydroxide-simeth (MAALOX/MYLANTA) 200-200-20 MG/5ML suspension 30 mL  30 mL Oral Q4H PRN Sanjuana Kava, NP      . clonazepam (KLONOPIN) disintegrating tablet 0.5 mg  0.5 mg Oral BID PRN Jazmyn Offner, MD   0.5 mg at 08/20/12 3086  . FLUoxetine (PROZAC) capsule 60 mg  60 mg Oral Daily Deanta Mincey, MD   60 mg at 08/20/12 0744  . gabapentin (NEURONTIN) capsule 300 mg  300 mg Oral TID Sanjuana Kava, NP      . magnesium hydroxide (MILK OF MAGNESIA) suspension 30 mL  30 mL Oral Daily PRN Sanjuana Kava, NP      . mirtazapine (REMERON) tablet 30 mg  30 mg Oral QHS Sanjuana Kava, NP   30 mg at 08/19/12 2209  . QUEtiapine (SEROQUEL) tablet 25 mg  25 mg  Oral BID Karolee Stamps, NP   25 mg at 08/20/12 0745  . QUEtiapine (SEROQUEL) tablet 25 mg  25 mg Oral Q6H PRN Karolee Stamps, NP   25 mg at 08/20/12 1405    Lab Results: No results found for this or any previous visit (from the past 48 hour(s)).  Physical Findings: AIMS: Facial and Oral Movements Muscles of Facial Expression: None, normal Lips and Perioral Area: None, normal Jaw: None, normal Tongue: None, normal,Extremity Movements Upper (arms, wrists, hands, fingers): None, normal Lower (legs, knees, ankles, toes): None,  normal, Trunk Movements Neck, shoulders, hips: None, normal, Overall Severity Severity of abnormal movements (highest score from questions above): None, normal Incapacitation due to abnormal movements: None, normal Patient's awareness of abnormal movements (rate only patient's report): No Awareness, Dental Status Current problems with teeth and/or dentures?: No Does patient usually wear dentures?: No  CIWA:  CIWA-Ar Total: 2 COWS:  COWS Total Score: 2  Treatment Plan Summary: Daily contact with patient to assess and evaluate symptoms and progress in treatment Medication management  Plan: Continue crisis management and stabilization.  Medication management: Regimen reviewed and no changes indicated for today.  Encouraged patient to attend groups and participate in group counseling sessions and activities.  Address health issues: Vitals reviewed and stable.  Discharge plan in progress. Patient will meet with treatment team tomorrow for evaluation.  Continue current treatment plan.   Medical Decision Making Problem Points:  Established problem, stable/improving (1) and Review of psycho-social stressors (1) Data Points:  Review of medication regiment & side effects (2)  I certify that inpatient services furnished can reasonably be expected to improve the patient's condition.   Fransisca Kaufmann ANN NP-C 08/20/2012, 2:12 PM

## 2012-08-20 NOTE — Progress Notes (Signed)
Patient ID: Heidi Pearson, female   DOB: 23-Oct-1975, 37 y.o.   MRN: [id number] She has been up and to groups interacting with peers and staff. She voiced that she wanted to go home today because she says that she has to return to work and that her employer  Knows why she is here, She denies thoughts of wanting to harm self, Self Inventory: depression 0 , hopelessness 1, denies SI thoughts and pain.

## 2012-08-20 NOTE — [facility] Group Notes (Signed)
[facility] LCSW Group Therapy      Feelings About Diagnosis 1:15 - 2:30 PM          08/20/2012 3:49 PM  Type of Therapy:  Group Therapy  Participation Level:  Active  Participation Quality:  Attentive and Drowsy  Affect:  Appropriate  Cognitive:  Appropriate  Insight:  Engaged  Engagement in Therapy:  Engaged  Modes of Intervention:  Discussion, Exploration, Problem-solving, Rapport Building and Support  Summary of Progress/Problems:  Patient shared it is difficult for her to deal with her anxiety and agitation now that she is not using drugs.  She stated the drugs allowed her to cover up things she did not want to face but now the covers are pulled back.  She stated relapsing is not an options as she believes she could have died from all the things she has done to herself while using drugs.  Wynn Banker 08/20/2012, 3:49 PM

## 2012-08-20 NOTE — Progress Notes (Signed)
Grief and Loss Group  Held group on grief and loss. Patients discussed loss of loved ones to death and the loss of self due to many different circumstances. Patients also discussed coping skills for loss and grief.   Pt was present and attentive during group.  Pt respectfully listened to group members and left the room briefly to support another patient's needs by asking a nurse for assistance.  Alvia Grove Counseling Intern [employer]

## 2012-08-20 NOTE — B[facility] Group Notes (Signed)
[facility] LCSW Aftercare Discharge Planning Group Note   08/20/2012 11:22 AM  Participation Quality:  Appropriate  Mood/Affect:  Appropriate  Depression Rating:  0  Anxiety Rating:  0  Thoughts of Suicide:  No  Will you contract for safety?   NA  Current AVH:  No  Plan for Discharge/Comments:  Patient reports doing good today.  She denies SI/HI and rates all symptoms at zero.  Patient advised of being hopeful to discharge home today or tomorrow.  She will be referred to [facility] for follow up.  Transportation Means: Patient has transportation  Supports:  Patient reports father is very supportive.  Azure Barrales, Joesph July

## 2012-08-21 MED ORDER — MIRTAZAPINE 30 MG PO TABS: 30.0000 mg | ORAL_TABLET | Freq: Every day | ORAL | Status: DC

## 2012-08-21 MED ORDER — QUETIAPINE FUMARATE 50 MG PO TABS
50.0000 mg | ORAL_TABLET | Freq: Two times a day (BID) | ORAL | Status: DC
  Filled 2012-08-21: qty 28
  Filled 2012-08-21 (×2): qty 1
  Filled 2012-08-21: qty 28

## 2012-08-21 MED ORDER — FLUOXETINE HCL 20 MG PO CAPS: 60.0000 mg | ORAL_CAPSULE | Freq: Every day | ORAL | Status: DC

## 2012-08-21 MED ORDER — CLONAZEPAM 0.5 MG PO TABS
0.5000 mg | ORAL_TABLET | Freq: Two times a day (BID) | ORAL | Status: DC | PRN
  Administered 2012-08-21: 0.5 mg via ORAL
  Filled 2012-08-21: qty 1

## 2012-08-21 MED ORDER — QUETIAPINE FUMARATE 50 MG PO TABS: 50.0000 mg | ORAL_TABLET | Freq: Two times a day (BID) | ORAL | Status: DC

## 2012-08-21 NOTE — [facility] Suicide Risk Assessment (Signed)
[facility] INPATIENT:  Family/Significant Other Suicide Prevention Education  Suicide Prevention Education:  Education Completed; Jan Caudle, Aunt, [phone];  has been identified by the patient as the family member/significant other with whom the patient will be residing, and identified as the person(s) who will aid the patient in the event of a mental health crisis (suicidal ideations/suicide attempt).  With written consent from the patient, the family member/significant other has been provided the following suicide prevention education, prior to the and/or following the discharge of the patient.  The suicide prevention education provided includes the following:  Suicide risk factors  Suicide prevention and interventions  National Suicide Hotline telephone number  [facility] assessment telephone number  [facility] Emergency Assistance 911  [facility] and/or Residential Mobile Crisis Unit telephone number  Request made of family/significant other to:  Remove weapons (e.g., guns, rifles, knives), all items previously/currently identified as safety concern.  Aunt advised that to her knowledge, patient does not have access to a gun.  Remove drugs/medications (over-the-counter, prescriptions, illicit drugs), all items previously/currently identified as a safety concern.  The family member/significant other verbalizes understanding of the suicide prevention education information provided.  The family member/significant other agrees to remove the items of safety concern listed above.  Wynn Banker 08/21/2012, 11:26 AM

## 2012-08-21 NOTE — Progress Notes (Signed)
Adult Psychoeducational Group Note  Date:  08/21/2012 Time:  11:57 AM  Group Topic/Focus:  Crisis Planning:   The purpose of this group is to help patients create a crisis plan for use upon discharge or in the future, as needed.  Participation Level:  Minimal  Participation Quality:  Drowsy  Affect:  Blunted  Cognitive:  Oriented  Insight: Good  Engagement in Group:  Lacking  Modes of Intervention:  Discussion and Education  Additional Comments:  Pt stated she is ready for discharge.  Heidi Pearson T 08/21/2012, 11:57 AM
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
## 2012-08-21 NOTE — Tx Team (Signed)
Interdisciplinary Treatment Plan Update   Date Reviewed:  08/21/2012  Time Reviewed:  9:47 AM  Progress in Treatment:   Attending groups: Yes Participating in groups: Yes Taking medication as prescribed: Yes  Tolerating medication: Yes Family/Significant other contact made:Yes, contact made with family. Patient understands diagnosis: Yes  Discussing patient identified problems/goals with staff: Yes Medical problems stabilized or resolved: Yes Denies suicidal/homicidal ideation: Yes Patient has not harmed self or others: Yes  For review of initial/current patient goals, please see plan of care.  Estimated Length of Stay:  Discharge today  Reasons for Continued Hospitalization:   New Problems/Goals identified:    Discharge Plan or Barriers:   Home with outpatient follow up to take place at [facility].  Additional Comments: Patient reports doing well and being ready to discharge home.  She denies SI/HI and rates depression at zero and anxiety at two.  Attendees:  Patient: Heidi Pearson 08/21/2012 9:47 AM   Signature: Patrick North, MD 08/21/2012 9:47 AM  Signature:  Berneice Heinrich, RN 08/21/2012 9:47 AM  Signature:  08/21/2012 9:47 AM  Signature:Beverly Terrilee Croak, RN 08/21/2012 9:47 AM  Signature: Erle Crocker 08/21/2012 9:47 AM  Signature:  Juline Patch, LCSW 08/21/2012 9:47 AM  Signature:  08/21/2012 9:47 AM  Signature:  08/21/2012 9:47 AM  Signature: 08/21/2012 9:47 AM  Signature:    Signature:    Signature:      Scribe for Treatment Team:   Juline Patch,  08/21/2012 9:47 AM

## 2012-08-21 NOTE — [facility] Suicide Risk Assessment (Signed)
Suicide Risk Assessment  Discharge Assessment     Demographic Factors:  Female, caucasian, divorced  Mental Status Per Nursing Assessment::   On Admission:  Suicidal ideation indicated by patient;Self-harm thoughts;Self-harm behaviors  Current Mental Status by Physician: Alert and oriented to 4. Denies aH/VH/SI/HI.  Loss Factors: NA  Historical Factors: Family history of mental illness or substance abuse and Impulsivity  Risk Reduction Factors:   Positive social support and Positive coping skills or problem solving skills  Continued Clinical Symptoms:  Depression:   Recent sense of peace/wellbeing  Cognitive Features That Contribute To Risk:  Cognitively intact   Suicide Risk:  Minimal: No identifiable suicidal ideation.  Patients presenting with no risk factors but with morbid ruminations; may be classified as minimal risk based on the severity of the depressive symptoms  Discharge Diagnoses:   AXIS I:  Anxiety Disorder NOS, MDD AXIS II:  Deferred AXIS III:   Past Medical History  Diagnosis Date  . Anxiety   . Depression    AXIS IV:  other psychosocial or environmental problems AXIS V:  61-70 mild symptoms  Plan Of Care/Follow-up recommendations:  Activity:  as tolerated Diet:  regular Follow up with outpatient appointments.  Is patient on multiple antipsychotic therapies at discharge:  No   Has Patient had three or more failed trials of antipsychotic monotherapy by history:  No  Recommended Plan for Multiple Antipsychotic Therapies: NA  Darrek Leasure 08/21/2012, 9:50 AM

## 2012-08-21 NOTE — [facility] Group Notes (Signed)
[facility] LCSW Aftercare Discharge Planning Group Note   08/21/2012 11:29 AM  Participation Quality:  Appropriate  Mood/Affect:  Appropriate  Depression Rating:  0  Anxiety Rating:  0  Thoughts of Suicide:  No   Will you contract for safety?   NA  Current AVH:  No  Plan for Discharge/Comments:  Patient reports doing great today and being ready to discharge home. She is denying SI/HI.  Patient to arrange for a friend to transport her home.  Transportation Means:  Patient has transportation   Supports:  Patient has support from family.  Thalia Turkington, Joesph July

## 2012-08-21 NOTE — Progress Notes (Signed)
D: Patient denies SI/HI/AVH. Patient rates hopelessness as 0,  depression as 0, and anxiety as 3.  Patient affect is appropriate. Mood is anxious.  Pt states "I was hoping to go home today.  Hopefully I will be able to leave tomorrow."  Patient did attend evening group. Patient visible on the milieu. No distress noted. A: Support and encouragement offered. Scheduled medications given to pt. Q 15 min checks continued for patient safety. R: Patient receptive. Patient remains safe on the unit.

## 2012-08-21 NOTE — Progress Notes (Signed)
[facility] Adult Case Management Discharge Plan :  Will you be returning to the same living situation after discharge: Yes,  Patient is returning to her home. At discharge, do you have transportation home?:Yes,  Patient will arrange for transporation home. Do you have the ability to pay for your medications:No.  Patient to be assisted with indigent medications.  Release of information consent forms completed and in the chart;  Patient's signature needed at discharge.  Patient to Follow up at: Follow-up Information   Follow up with [facility] On 08/27/2012. (You are scheduled with [facility] at 10 AM on Tuesday, August 27, 2012)    Contact information:   [address] [place], [place]  [address]  [phone]      Follow up with [facility] On 08/23/2012. (Please go to [facility] walk in clinic on Friday, August 23, 2012 or any weekday between 8AM-3PM for medication managment.  Please be prepared to wait to be seen as initial visits are on a first come first served basis.)    Contact information:   [address] [place],  [place]  [address]  [phone]      Patient denies SI/HI:   Yes,  Patient is not endorsing SI/HI or thoughts of self harm.    Safety Planning and Suicide Prevention discussed:  Yes,  Reviewed during aftercare group.  Wynn Banker 08/21/2012, 11:27 AM

## 2012-08-21 NOTE — Progress Notes (Addendum)
Patient ID: Heidi Pearson, female   DOB: [birth year], 37 y.o.   MRN: [id number] She has been discharged home and was picked up by a long tome friend . She voiced understanding of discharge instruction and of follow up plan.  She denies thoughts of SI and all her belongs were taken home with her. She was anxious about leaving and requested and received a klonopin before leaving.

## 2012-08-26 NOTE — Progress Notes (Signed)
Patient Discharge Instructions:  After Visit Summary (AVS):   Faxed to:  08/26/12 Discharge Summary Note:   Faxed to:  08/26/12 Psychiatric Admission Assessment Note:   Faxed to:  08/26/12 Suicide Risk Assessment - Discharge Assessment:   Faxed to:  08/26/12 Faxed/Sent to the Next Level Care provider:  08/26/12 Faxed to [facility] @ [phone] Faxed to [facility] @ [phone]  Jerelene Redden, 08/26/2012, 3:32 PM

## 2012-09-20 ENCOUNTER — Encounter (HOSPITAL_COMMUNITY): Payer: Self-pay

## 2012-09-20 ENCOUNTER — Emergency Department (HOSPITAL_COMMUNITY)
Admission: EM | Admit: 2012-09-20 | Discharge: 2012-09-20 | Disposition: A | Discharge status: Home / Self Care | Payer: Self-pay | Attending: Emergency Medicine | Admitting: Emergency Medicine

## 2012-09-20 ENCOUNTER — Emergency Department (HOSPITAL_COMMUNITY): Payer: Self-pay

## 2012-09-20 DIAGNOSIS — M25469 Effusion, unspecified knee: Secondary | ICD-10-CM | POA: Insufficient documentation

## 2012-09-20 DIAGNOSIS — Z87828 Personal history of other (healed) physical injury and trauma: Secondary | ICD-10-CM | POA: Insufficient documentation

## 2012-09-20 DIAGNOSIS — M25569 Pain in unspecified knee: Secondary | ICD-10-CM | POA: Insufficient documentation

## 2012-09-20 DIAGNOSIS — Z8659 Personal history of other mental and behavioral disorders: Secondary | ICD-10-CM | POA: Insufficient documentation

## 2012-09-20 DIAGNOSIS — G8929 Other chronic pain: Secondary | ICD-10-CM | POA: Insufficient documentation

## 2012-09-20 DIAGNOSIS — M25461 Effusion, right knee: Secondary | ICD-10-CM

## 2012-09-20 MED ORDER — HYDROCODONE-ACETAMINOPHEN 5-325 MG PO TABS
2.0000 | ORAL_TABLET | Freq: Once | ORAL | Status: AC
  Administered 2012-09-20: 2 via ORAL
  Filled 2012-09-20: qty 2

## 2012-09-20 MED ORDER — HYDROCODONE-ACETAMINOPHEN 5-325 MG PO TABS: 1.0000 | ORAL_TABLET | ORAL | Status: DC | PRN

## 2012-09-20 NOTE — Progress Notes (Signed)
Orthopedic Tech Progress Note Patient Details:  Heidi Pearson 10/09/1975 [id number]  Ortho Devices Type of Ortho Device: Knee Sleeve Ortho Device/Splint Location: RLE Ortho Device/Splint Interventions: Ordered;Application   Jennye Moccasin 09/20/2012, 9:57 PM
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
## 2012-09-20 NOTE — ED Notes (Signed)
Pt reports right knee pain for several years. States that she has had PT and was seeing an orthopedist but lost her insurance. Reports that right knee pain has increased this past month because of having to be on her feet constantly for work. States that she has taken otc meds and ice to the knee with no relief. Pt reports knee pain 7/10.

## 2012-09-21 NOTE — ED Provider Notes (Signed)
Medical screening examination/treatment/procedure(s) were performed by non-physician practitioner and as supervising physician I was immediately available for consultation/collaboration.   Carleene Cooper III, MD 09/21/12 1302

## 2012-09-27 ENCOUNTER — Encounter (HOSPITAL_COMMUNITY): Payer: Self-pay | Admitting: *Deleted

## 2012-09-27 ENCOUNTER — Emergency Department (HOSPITAL_COMMUNITY)
Admission: EM | Admit: 2012-09-27 | Discharge: 2012-09-27 | Disposition: A | Discharge status: Home / Self Care | Payer: Self-pay | Attending: Emergency Medicine | Admitting: Emergency Medicine

## 2012-09-27 DIAGNOSIS — Z87828 Personal history of other (healed) physical injury and trauma: Secondary | ICD-10-CM | POA: Insufficient documentation

## 2012-09-27 DIAGNOSIS — G8929 Other chronic pain: Secondary | ICD-10-CM | POA: Insufficient documentation

## 2012-09-27 DIAGNOSIS — S79929A Unspecified injury of unspecified thigh, initial encounter: Secondary | ICD-10-CM | POA: Insufficient documentation

## 2012-09-27 DIAGNOSIS — S79919A Unspecified injury of unspecified hip, initial encounter: Secondary | ICD-10-CM | POA: Insufficient documentation

## 2012-09-27 DIAGNOSIS — Z8659 Personal history of other mental and behavioral disorders: Secondary | ICD-10-CM | POA: Insufficient documentation

## 2012-09-27 HISTORY — DX: Other chronic pain: G89.29

## 2012-09-27 HISTORY — DX: Pain in unspecified knee: M25.569

## 2012-09-27 MED ORDER — OXYCODONE-ACETAMINOPHEN 5-325 MG PO TABS
1.0000 | ORAL_TABLET | Freq: Once | ORAL | Status: AC
  Administered 2012-09-27: 1 via ORAL
  Filled 2012-09-27: qty 1

## 2012-09-27 MED ORDER — NAPROXEN 500 MG PO TABS: 500.0000 mg | ORAL_TABLET | Freq: Two times a day (BID) | ORAL | Status: DC
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
## 2012-09-27 NOTE — ED Notes (Signed)
[employer] at bedside talking with pt. Reports does have somewhere safe to go & live. States was brought here by her aunt & will be staying with her.

## 2012-10-07 ENCOUNTER — Emergency Department (HOSPITAL_COMMUNITY)
Admission: EM | Admit: 2012-10-07 | Discharge: 2012-10-07 | Disposition: A | Discharge status: Home / Self Care | Payer: Self-pay | Attending: Emergency Medicine | Admitting: Emergency Medicine

## 2012-10-07 ENCOUNTER — Encounter (HOSPITAL_COMMUNITY): Payer: Self-pay | Admitting: Emergency Medicine

## 2012-10-07 ENCOUNTER — Emergency Department (HOSPITAL_COMMUNITY)
Admission: EM | Admit: 2012-10-07 | Discharge: 2012-10-08 | Disposition: A | Discharge status: Other Acute Inpatient Hospital | Payer: Self-pay | Attending: Emergency Medicine | Admitting: Emergency Medicine

## 2012-10-07 DIAGNOSIS — F419 Anxiety disorder, unspecified: Secondary | ICD-10-CM

## 2012-10-07 DIAGNOSIS — F32A Depression, unspecified: Secondary | ICD-10-CM

## 2012-10-07 DIAGNOSIS — F329 Major depressive disorder, single episode, unspecified: Secondary | ICD-10-CM | POA: Insufficient documentation

## 2012-10-07 DIAGNOSIS — M25569 Pain in unspecified knee: Secondary | ICD-10-CM | POA: Insufficient documentation

## 2012-10-07 DIAGNOSIS — G479 Sleep disorder, unspecified: Secondary | ICD-10-CM | POA: Insufficient documentation

## 2012-10-07 DIAGNOSIS — G8929 Other chronic pain: Secondary | ICD-10-CM | POA: Insufficient documentation

## 2012-10-07 DIAGNOSIS — F3289 Other specified depressive episodes: Secondary | ICD-10-CM | POA: Insufficient documentation

## 2012-10-07 DIAGNOSIS — Z3202 Encounter for pregnancy test, result negative: Secondary | ICD-10-CM | POA: Insufficient documentation

## 2012-10-07 DIAGNOSIS — R45851 Suicidal ideations: Secondary | ICD-10-CM | POA: Insufficient documentation

## 2012-10-07 DIAGNOSIS — E669 Obesity, unspecified: Secondary | ICD-10-CM | POA: Insufficient documentation

## 2012-10-07 DIAGNOSIS — F411 Generalized anxiety disorder: Secondary | ICD-10-CM | POA: Insufficient documentation

## 2012-10-07 DIAGNOSIS — S61509A Unspecified open wound of unspecified wrist, initial encounter: Secondary | ICD-10-CM | POA: Insufficient documentation

## 2012-10-07 DIAGNOSIS — F410 Panic disorder [episodic paroxysmal anxiety] without agoraphobia: Secondary | ICD-10-CM | POA: Insufficient documentation

## 2012-10-07 DIAGNOSIS — Z8659 Personal history of other mental and behavioral disorders: Secondary | ICD-10-CM | POA: Insufficient documentation

## 2012-10-07 DIAGNOSIS — X789XXA Intentional self-harm by unspecified sharp object, initial encounter: Secondary | ICD-10-CM | POA: Insufficient documentation

## 2012-10-07 LAB — RAPID URINE DRUG SCREEN, HOSP PERFORMED
Amphetamines: NOT DETECTED
Barbiturates: NOT DETECTED
Benzodiazepines: POSITIVE — AB
Cocaine: NOT DETECTED
Opiates: NOT DETECTED
Tetrahydrocannabinol: NOT DETECTED

## 2012-10-07 LAB — CBC WITH DIFFERENTIAL/PLATELET
Basophils Absolute: 0.1 10*3/uL (ref 0.0–0.1)
Basophils Relative: 1 % (ref 0–1)
Eosinophils Absolute: 0.5 10*3/uL (ref 0.0–0.7)
Eosinophils Relative: 7 % — ABNORMAL HIGH (ref 0–5)
HCT: 37.3 % (ref 36.0–46.0)
Hemoglobin: 12.2 g/dL (ref 12.0–15.0)
Lymphocytes Relative: 30 % (ref 12–46)
Lymphs Abs: 2 10*3/uL (ref 0.7–4.0)
MCH: 28 pg (ref 26.0–34.0)
MCHC: 32.7 g/dL (ref 30.0–36.0)
MCV: 85.7 fL (ref 78.0–100.0)
Monocytes Absolute: 0.5 10*3/uL (ref 0.1–1.0)
Monocytes Relative: 7 % (ref 3–12)
Neutro Abs: 3.6 10*3/uL (ref 1.7–7.7)
Neutrophils Relative %: 55 % (ref 43–77)
Platelets: 345 10*3/uL (ref 150–400)
RBC: 4.35 MIL/uL (ref 3.87–5.11)
RDW: 13.1 % (ref 11.5–15.5)
WBC: 6.6 10*3/uL (ref 4.0–10.5)

## 2012-10-07 LAB — PREGNANCY, URINE: Preg Test, Ur: NEGATIVE

## 2012-10-07 LAB — BASIC METABOLIC PANEL
BUN: 7 mg/dL (ref 6–23)
CO2: 24 mEq/L (ref 19–32)
Chloride: 101 mEq/L (ref 96–112)
Creatinine, Ser: 0.72 mg/dL (ref 0.50–1.10)
Glucose, Bld: 91 mg/dL (ref 70–99)

## 2012-10-07 LAB — ETHANOL: Alcohol, Ethyl (B): <11 mg/dL (ref 0–11)

## 2012-10-07 MED ORDER — LORAZEPAM 1 MG PO TABS
1.0000 mg | ORAL_TABLET | Freq: Three times a day (TID) | ORAL | Status: DC | PRN
  Administered 2012-10-07: 1 mg via ORAL
  Filled 2012-10-07: qty 2

## 2012-10-07 MED ORDER — ACETAMINOPHEN 325 MG PO TABS: 650.0000 mg | ORAL_TABLET | ORAL | Status: DC | PRN

## 2012-10-07 MED ORDER — IBUPROFEN 400 MG PO TABS: 600.0000 mg | ORAL_TABLET | Freq: Three times a day (TID) | ORAL | Status: DC | PRN

## 2012-10-07 MED ORDER — ALUM & MAG HYDROXIDE-SIMETH 200-200-20 MG/5ML PO SUSP: 30.0000 mL | ORAL | Status: DC | PRN

## 2012-10-07 MED ORDER — NICOTINE 21 MG/24HR TD PT24
21.0000 mg | MEDICATED_PATCH | Freq: Every day | TRANSDERMAL | Status: DC
  Filled 2012-10-07: qty 1

## 2012-10-07 MED ORDER — ZOLPIDEM TARTRATE 5 MG PO TABS: 5.0000 mg | ORAL_TABLET | Freq: Every evening | ORAL | Status: DC | PRN

## 2012-10-07 MED ORDER — ONDANSETRON HCL 4 MG PO TABS: 4.0000 mg | ORAL_TABLET | Freq: Three times a day (TID) | ORAL | Status: DC | PRN

## 2012-10-07 MED ORDER — LORAZEPAM 1 MG PO TABS
1.0000 mg | ORAL_TABLET | Freq: Once | ORAL | Status: AC
  Administered 2012-10-07: 1 mg via ORAL
  Filled 2012-10-07: qty 2

## 2012-10-07 MED ORDER — BUSPIRONE HCL 15 MG PO TABS: 15.0000 mg | ORAL_TABLET | Freq: Two times a day (BID) | ORAL | Status: DC | PRN

## 2012-10-07 MED ORDER — IBUPROFEN 400 MG PO TABS
600.0000 mg | ORAL_TABLET | Freq: Three times a day (TID) | ORAL | Status: DC | PRN
  Administered 2012-10-07: 600 mg via ORAL
  Filled 2012-10-07: qty 1

## 2012-10-07 NOTE — ED Notes (Signed)
telepsych in progress 

## 2012-10-07 NOTE — ED Provider Notes (Signed)
History     CSN: [id number]  Arrival date & time 10/07/12  0840   First MD Initiated Contact with Patient 10/07/12 [phone]      No chief complaint on file.   (Consider location/radiation/quality/duration/timing/severity/associated sxs/prior treatment) HPI Patient seen at [facility] ER for the same thing just prior to arrival here in the ED. She was instructed to go to [facility] to see a psychiatrist but when she arrived their they said  they did not have any psychiatrist to see her. She said that she is unable to go on without seeing someone today.  Patient reports she is having panic attacks for the past several days. She reports that her son is in jail and she worse about him her living situation is bad. She denies suicidal ideations or homicidal ideations. A friend stopped her from mutilating herself this morning. She's been suffering panic attacks and anxiety since age 37. She's been on multiple medications including Klonopin Prozac and others which he has stopped. No treatment prior to coming here. Nothing makes symptoms better or worse    Past Medical History  Diagnosis Date  . Anxiety   . Depression   . Chronic knee pain     Past Surgical History  Procedure Laterality Date  . Cesarean section      No family history on file.  History  Substance Use Topics  . Smoking status: Never Smoker   . Smokeless tobacco: Never Used  . Alcohol Use: No    OB History   Grav Para Term Preterm Abortions TAB SAB Ect Mult Living                  Review of Systems  Psychiatric/Behavioral: The patient is nervous/anxious.   All other systems reviewed and are negative.    Allergies  Tramadol and Vistaril  Home Medications  No current outpatient prescriptions on file.  BP 139/85  Pulse 75  Temp(Src) 97.9 F (36.6 C) (Oral)  SpO2 98%  LMP 09/20/2012  Physical Exam  Constitutional: She appears well-developed and well-nourished.  HENT:  Head: Normocephalic and atraumatic.   Eyes: Pupils are equal, round, and reactive to light.  Neck: Trachea normal, normal range of motion and full passive range of motion without pain.  Cardiovascular: Normal rate.   Pulmonary/Chest: Effort normal.  Abdominal: Soft. Normal appearance and bowel sounds are normal.  Musculoskeletal: Normal range of motion.  Neurological: She is alert. She has normal strength.  Skin: Skin is warm, dry and intact.  Psychiatric: Her speech is normal. Judgment and thought content normal. Her mood appears anxious. Cognition and memory are normal. She exhibits a depressed mood. She expresses no homicidal and no suicidal ideation. She expresses no suicidal plans and no homicidal plans.    ED Course  Procedures (including critical care time)  Labs Reviewed  CBC WITH DIFFERENTIAL  BASIC METABOLIC PANEL  URINE RAPID DRUG SCREEN (HOSP PERFORMED)  ETHANOL  PREGNANCY, URINE   No results found.   1. Depression   2. Panic attacks       MDM  Labs drawn, holding orders placed, Telepsych ordered. ACT made aware patient is back.          Dorthula Matas, PA-C 10/07/12 [phone]

## 2012-10-07 NOTE — ED Notes (Signed)
Pt. Very upset that she did not receive any Ativan or Klonipin.  Explained to pt. This is what the Tele-psychiatrist recommended.  Spoke with Tiffany, PA and she is not going against  What the Tele-phychiatrist recommended.  I explained to pt. Again , that the Buspar 15mg  is what the psychiatrist recommended.  PT. Stated, "So I have to go and cut myself to get some relief because you will not give me ATivan."  Pt. Did not at any time threatened to kill herself.  Belenda Cruise, Act counselor and Drinda Butts, RN was present when giving pt. Her discharge.  She refused her discharge papers and refused her prescription.   Reported to [place], [place].

## 2012-10-07 NOTE — ED Provider Notes (Addendum)
History  This chart was scribed for non-physician practitioner working with Gwyneth Sprout, MD by Greggory Stallion, ED scribe. This patient was seen in room TR10C/TR10C and the patient's care was started at 9:09 PM.  CSN: [id number]  Arrival date & time 10/07/12  2108   No chief complaint on file.   The history is provided by the patient. No language interpreter was used.    HPI Comments: Heidi Pearson is a 37 y.o. female who presents to the Emergency Department complaining of suicidal ideations. Pt states she attempted to self mutilate on her right wrist to actually kill herself today. Pt states that she normally cuts herself just to self mutilate, not to kill herself. She states her fingers and lips feel numb. Pt states her anxiety is worse in the morning and at night so she doesn't sleep very much. Pt states she was at [facility] this morning and was discharged shortly after that. Pt denies smoking or alcohol use. Pt denies fever, neck pain, sore throat, visual disturbance, CP, cough, SOB, abdominal pain, nausea, emesis, diarrhea, urinary symptoms, back pain, HA, weakness, numbness and rash as associated symptoms. Pt states she has not taken any medications. She states she had a psychiatrist in [place] but when she moved her a few months ago, she couldn't find one that was beneficial. Pt states she was hospitalized at [facility] a few months ago.  Past Medical History  Diagnosis Date  . Anxiety   . Depression   . Chronic knee pain     Past Surgical History  Procedure Laterality Date  . Cesarean section      No family history on file.  History  Substance Use Topics  . Smoking status: Never Smoker   . Smokeless tobacco: Never Used  . Alcohol Use: No    OB History   Grav Para Term Preterm Abortions TAB SAB Ect Mult Living                  Review of Systems  A complete 10 system review of systems was obtained and all systems are negative except as noted in the  HPI and PMH.   Allergies  Tramadol and Vistaril  Home Medications   Current Outpatient Rx  Name  Route  Sig  Dispense  Refill  . busPIRone (BUSPAR) 15 MG tablet   Oral   Take 1 tablet (15 mg total) by mouth 2 (two) times daily as needed.   28 tablet   0     LMP 09/20/2012  Physical Exam  Nursing note and vitals reviewed. Constitutional: She is oriented to person, place, and time. She appears well-developed and well-nourished.  HENT:  Head: Normocephalic and atraumatic.  Eyes: Pupils are equal, round, and reactive to light.  Neck: Normal range of motion.  Cardiovascular: Normal rate and regular rhythm.   Pulmonary/Chest: Effort normal.  Musculoskeletal: Normal range of motion.  Superficial abrasion to right ventral wrist.   Neurological: She is alert and oriented to person, place, and time.  Neurovascularly intact.   Skin: Skin is warm and dry.  Psychiatric:  Suicidal ideations.     ED Course  Procedures (including critical care time)  DIAGNOSTIC STUDIES: Oxygen Saturation is 100% on RA, normal by my interpretation.    COORDINATION OF CARE: 9:19 PM-Discussed treatment plan with pt at bedside and pt agreed to plan.   Labs Reviewed - No data to display No results found.   1. Anxiety   2. Depression  3. Suicidal intent       MDM   Patient presenting here to 2 suicidal attempt. She's been seen this will be the third time  Today. Earlier today she was seen for anxiety and had a telepsych back recommended she needed to be on BuSpar. However she still scars not helpful and when she went home she wanted to die and attempted to cut her wrists. She has a history of self mutilating behavior but state this was different she wanted to die.  She denies any alcohol or drug use and did not overdose on any medications because she stated house where she was staying had no Tylenol. All labs were done today and negative. Do not feel she needs repeat labs at this time. Mild  superficial abrasions on the wrist than the Neosporin only. ACT to see and evaluate.      I personally performed the services described in this documentation, which was scribed in my presence.  The recorded information has been reviewed and considered.   Gwyneth Sprout, MD 10/07/12 2207  Gwyneth Sprout, MD 10/07/12 2208

## 2012-10-07 NOTE — ED Notes (Signed)
ACT team at bedside.  

## 2012-10-07 NOTE — ED Notes (Signed)
House coverage called for sitter.  

## 2012-10-07 NOTE — ED Notes (Signed)
Per PTAR: Pt from [facility] where pt slit bilateral wrist superficially. Sites bandaged, no bleeding noted. Pt calm and cooperative. 100/70. 86 bpm. 18 RR. 99% RA.

## 2012-10-07 NOTE — ED Notes (Signed)
Pt c/o panic attack onset yesterday, pt states friend stopped the progress yesterday. 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

## 2012-10-07 NOTE — ED Provider Notes (Signed)
Medical screening examination/treatment/procedure(s) were performed by non-physician practitioner and as supervising physician I was immediately available for consultation/collaboration.   Keilany Burnette M Natosha Bou, MD 10/07/12 1546 

## 2012-10-07 NOTE — ED Notes (Signed)
Telepsych complete

## 2012-10-07 NOTE — ED Notes (Signed)
Pt. oob to the bathroom, gait steady.  

## 2012-10-07 NOTE — ED Provider Notes (Signed)
Medical screening examination/treatment/procedure(s) were performed by non-physician practitioner and as supervising physician I was immediately available for consultation/collaboration.  Dione Booze, MD 10/07/12 [phone]

## 2012-10-07 NOTE — ED Provider Notes (Signed)
Medical screening examination/treatment/procedure(s) were performed by non-physician practitioner and as supervising physician I was immediately available for consultation/collaboration.   Dione Booze, MD 10/07/12 [phone]
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
## 2012-10-07 NOTE — ED Notes (Signed)
MD at bedside. 

## 2012-10-07 NOTE — ED Notes (Signed)
Pt was seen at [facility] this am and sent to [facility]. When pt arrived there, they told her they were only seeing outpatients, that they did not have a doctor there today. Pt states she is having trouble with anxiety which she has had for some time. States she has been on "every medicine possible" but stopped taking them about a month ago. States has been doing well until recently..that her son was sent to jail and that she stays up all night worrying about him. 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

## 2012-10-07 NOTE — ED Notes (Addendum)
Pt placed in paper scrubs. Friend to take belongings including purse.

## 2012-10-07 NOTE — ED Provider Notes (Signed)
Patient telepsych completed.  He recommends BuSpar 15 mg prn anxiety po BID.  He does not feel she needs inpatient and I agree. She is not SI, HI, hallucinating, aggressive, agitated. She is acting normal.  Pt has been advised of the symptoms that warrant their return to the ED. Patient has voiced understanding and has agreed to follow-up with the PCP or specialist.   Dorthula Matas, PA-C 10/07/12 1222

## 2012-10-07 NOTE — ED Provider Notes (Signed)
History     CSN: [id number]  Arrival date & time 10/07/12  7829   First MD Initiated Contact with Patient 10/07/12 [phone]      Chief Complaint  Patient presents with  . Panic Attack    (Consider location/radiation/quality/duration/timing/severity/associated sxs/prior treatment) HPI Patient reports she is having panic attacks for the past several days. She reports that her son is in jail and she worse about him her living situation is bad. She denies suicidal ideations or homicidal ideations. A friend stopped her from mutilating herself this morning. She's been suffering panic attacks and anxiety since age 10. She's been on multiple medications including Klonopin Prozac and others which he has stopped. No treatment prior to coming here. Nothing makes symptoms better or worse Past Medical History  Diagnosis Date  . Anxiety   . Depression   . Chronic knee pain     Past Surgical History  Procedure Laterality Date  . Cesarean section      No family history on file.  History  Substance Use Topics  . Smoking status: Never Smoker   . Smokeless tobacco: Never Used  . Alcohol Use: No    OB History   Grav Para Term Preterm Abortions TAB SAB Ect Mult Living                  Review of Systems  Constitutional: Negative.   HENT: Negative.   Respiratory: Negative.   Allergic/Immunologic: Negative.   Psychiatric/Behavioral: Positive for sleep disturbance.       Anxiety    Allergies  Tramadol and Vistaril  Home Medications   Current Outpatient Rx  Name  Route  Sig  Dispense  Refill  . diphenhydrAMINE (BENADRYL) 25 mg capsule   Oral   Take 25 mg by mouth daily as needed for allergies.         . naproxen (NAPROSYN) 500 MG tablet   Oral   Take 1 tablet (500 mg total) by mouth 2 (two) times daily.   30 tablet   0     BP 113/68  Pulse 80  Temp(Src) 97.5 F (36.4 C) (Oral)  Resp 20  Ht 5\' 3"  (1.6 m)  Wt 180 lb (81.647 kg)  BMI 31.89 kg/m2  SpO2 96%  LMP  09/20/2012  Physical Exam  Nursing note and vitals reviewed. Constitutional: She is oriented to person, place, and time. She appears well-developed and well-nourished.  HENT:  Head: Normocephalic and atraumatic.  Eyes: Conjunctivae are normal. Pupils are equal, round, and reactive to light.  Neck: Neck supple. No tracheal deviation present. No thyromegaly present.  Cardiovascular: Normal rate and regular rhythm.   No murmur heard. Pulmonary/Chest: Effort normal and breath sounds normal.  Abdominal: Soft. Bowel sounds are normal. She exhibits no distension. There is no tenderness.  Obese  Musculoskeletal: Normal range of motion. She exhibits no edema and no tenderness.  Neurological: She is alert and oriented to person, place, and time. No cranial nerve deficit. Coordination normal.  Gait normal  Skin: Skin is warm and dry. No rash noted.  Psychiatric: She has a normal mood and affect.    ED Course  Procedures (including critical care time)  Labs Reviewed - No data to display No results found.   No diagnosis found.    MDM  [facility] contacted. Patient isn't well known there. She can be seen there immediately upon leaving here. Diagnosis panic attack        Doug Sou, MD 10/07/12 [phone]

## 2012-10-08 ENCOUNTER — Encounter (HOSPITAL_COMMUNITY): Payer: Self-pay

## 2012-10-08 ENCOUNTER — Inpatient Hospital Stay (HOSPITAL_COMMUNITY)
Admission: AD | Admit: 2012-10-08 | Discharge: 2012-10-11 | DRG: 880 | Disposition: A | Discharge status: Home / Self Care | Payer: No Typology Code available for payment source | Attending: Psychiatry | Admitting: Psychiatry

## 2012-10-08 DIAGNOSIS — F1121 Opioid dependence, in remission: Secondary | ICD-10-CM

## 2012-10-08 DIAGNOSIS — F419 Anxiety disorder, unspecified: Secondary | ICD-10-CM

## 2012-10-08 DIAGNOSIS — Z79899 Other long term (current) drug therapy: Secondary | ICD-10-CM

## 2012-10-08 DIAGNOSIS — F339 Major depressive disorder, recurrent, unspecified: Secondary | ICD-10-CM | POA: Diagnosis present

## 2012-10-08 DIAGNOSIS — F4310 Post-traumatic stress disorder, unspecified: Secondary | ICD-10-CM | POA: Diagnosis present

## 2012-10-08 DIAGNOSIS — F909 Attention-deficit hyperactivity disorder, unspecified type: Secondary | ICD-10-CM | POA: Diagnosis present

## 2012-10-08 DIAGNOSIS — F329 Major depressive disorder, single episode, unspecified: Secondary | ICD-10-CM | POA: Diagnosis present

## 2012-10-08 DIAGNOSIS — F410 Panic disorder [episodic paroxysmal anxiety] without agoraphobia: Principal | ICD-10-CM | POA: Diagnosis present

## 2012-10-08 MED ORDER — DIPHENHYDRAMINE HCL 25 MG PO CAPS
25.0000 mg | ORAL_CAPSULE | Freq: Once | ORAL | Status: DC
  Filled 2012-10-08: qty 1

## 2012-10-08 MED ORDER — CHLORDIAZEPOXIDE HCL 25 MG PO CAPS
25.0000 mg | ORAL_CAPSULE | Freq: Four times a day (QID) | ORAL | Status: DC | PRN
  Administered 2012-10-08: 25 mg via ORAL
  Filled 2012-10-08 (×2): qty 1

## 2012-10-08 MED ORDER — AMPHETAMINE-DEXTROAMPHETAMINE 10 MG PO TABS: 10.0000 mg | ORAL_TABLET | Freq: Two times a day (BID) | ORAL | Status: DC

## 2012-10-08 MED ORDER — ACETAMINOPHEN 325 MG PO TABS
650.0000 mg | ORAL_TABLET | Freq: Four times a day (QID) | ORAL | Status: DC | PRN
  Administered 2012-10-10: 650 mg via ORAL

## 2012-10-08 MED ORDER — CLONAZEPAM 0.5 MG PO TABS
0.5000 mg | ORAL_TABLET | Freq: Two times a day (BID) | ORAL | Status: DC
  Administered 2012-10-08 – 2012-10-11 (×7): 0.5 mg via ORAL
  Filled 2012-10-08 (×7): qty 1

## 2012-10-08 MED ORDER — MAGNESIUM HYDROXIDE 400 MG/5ML PO SUSP: 30.0000 mL | Freq: Every day | ORAL | Status: DC | PRN

## 2012-10-08 MED ORDER — AMPHETAMINE-DEXTROAMPHETAMINE 10 MG PO TABS
10.0000 mg | ORAL_TABLET | Freq: Two times a day (BID) | ORAL | Status: DC
  Administered 2012-10-08 – 2012-10-09 (×3): 10 mg via ORAL
  Filled 2012-10-08 (×3): qty 1

## 2012-10-08 MED ORDER — NICOTINE 14 MG/24HR TD PT24
14.0000 mg | MEDICATED_PATCH | Freq: Every day | TRANSDERMAL | Status: DC
  Filled 2012-10-08: qty 1

## 2012-10-08 MED ORDER — ALUM & MAG HYDROXIDE-SIMETH 200-200-20 MG/5ML PO SUSP: 30.0000 mL | ORAL | Status: DC | PRN

## 2012-10-08 MED ORDER — TRAZODONE HCL 50 MG PO TABS
50.0000 mg | ORAL_TABLET | Freq: Every evening | ORAL | Status: DC | PRN
  Administered 2012-10-08 – 2012-10-10 (×4): 50 mg via ORAL
  Filled 2012-10-08 (×2): qty 1
  Filled 2012-10-08: qty 28
  Filled 2012-10-08 (×6): qty 1
  Filled 2012-10-08: qty 28

## 2012-10-08 MED ORDER — PAROXETINE HCL 10 MG PO TABS
10.0000 mg | ORAL_TABLET | Freq: Every day | ORAL | Status: DC
  Administered 2012-10-08 – 2012-10-09 (×2): 10 mg via ORAL
  Filled 2012-10-08 (×4): qty 1

## 2012-10-08 MED ORDER — IBUPROFEN 600 MG PO TABS: 600.0000 mg | ORAL_TABLET | Freq: Four times a day (QID) | ORAL | Status: DC | PRN

## 2012-10-08 NOTE — BHH Counselor (Signed)
Heidi Pearson, assessment counselor at [facility], submitted Pt for admission to [facility]. Akeysha McMurren, AC confirmed bed availability. Donell Sievert, PA reviewed clinical information and accepted Pt to the service of Dr. Geoffery Lyons, room 303-1. Notified Heidi Pearson of acceptance.  Harlin Rain Patsy [place], LPC, [facility] Assessment Counselor
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
## 2012-10-08 NOTE — ED Notes (Signed)
Pt becoming more agitated, EDP aware of situation.

## 2012-10-08 NOTE — BHH Group Notes (Signed)
BHH LCSW Group Therapy  10/08/2012 1:15 PM  Type of Therapy:  Group Therapy 1:15 to 2:30 PM  Participation Level:  Active, when present  Participation Quality:  Appropriate  Affect:  Appropriate  Cognitive:  Appropriate  Insight:  Developing/Improving  Engagement in Therapy:  Developing/Improving  Modes of Intervention:  Discussion, Education, Socialization and Support  Summary of Progress/Problems: Patient attended group presentation by staff member of  [employer] ([employer]). Heidi Pearson was attentive and appropriate during session.  She was able to process how she has used in the past to cover up insecurities and other feelings.  Patient needed redirection to avoid side conversations.  Clide Dales

## 2012-10-08 NOTE — ED Notes (Signed)
PT out to desk to ask for sleep aid. EDP made aware.

## 2012-10-08 NOTE — Progress Notes (Signed)
Recreation Therapy Notes  Date: 05.27.2014 Time: 2:45pm Location: [address] Day Room      Group Topic/Focus: Musician (AAA/T)  Participation Level: Active  Participation Quality: Appropriate  Affect: Euthymic  Cognitive: Appropriate  Additional Comments:05.27.2014 Session = AAA Session; Dog Team = [facility] & handler  Patient pet and visited with [place]. Patient asked appropriate questions about [facility] and his training. Patient interacted appropriately with dog team, LRT and peers.   Marykay Lex Santi Troung, LRT/CTRS  Tateanna Bach L 10/08/2012 4:00 PM
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
## 2012-10-08 NOTE — BH Assessment (Signed)
[facility] Assessment Progress Note   Patient has been accepted to [facility] by Donell Sievert, PA to Dr. Dub Mikes.  Room 303-1 assigned.  Patient was IVC'ed by EDP based on patient saying that she would not go to [facility].

## 2012-10-08 NOTE — Progress Notes (Signed)
Pt reported her sleep as poor for she came in late around 0400 this morning. Her appetite good energy low and ability to pay attention as poor.  Depression 7 hopelessness 5 and anxiety a 8 on her self-inventory.  Denies any S/H ideations or A/V hallucinations.  She plans to f/u at her P[facility].  She had an order to move to [address] and program until a bed became open. Pt came up tearful stating,"I really think I will get more out of the AA/NA groups since I used to be an addict.  Spoke with Dr. Dub Mikes and he will allow pt to stay on [address] and to program on [address] as well.  Pt is taking klonopin and was counseled by Dr. Dub Mikes  Not to let her peers know she is taking the klonopin.  Pt did voice understanding.

## 2012-10-08 NOTE — ED Notes (Signed)
Pt requesting sleep aid. EDP aware

## 2012-10-08 NOTE — ED Notes (Signed)
Pt discharged in police custody to [facility]. IVC papers served.

## 2012-10-08 NOTE — BHH Group Notes (Signed)
   10/08/2012  8:45 AM  Participation Quality:  Did not attend, patient meeting with physician  Clide Dales

## 2012-10-08 NOTE — Progress Notes (Signed)
Recreation Therapy Notes  Date: 05.27.2014 Time: 3:15pm Location: [address] Dayroom      Group Topic/Focus: Communication, Problem Solving, Team Work  Participation Level: Active  Participation Quality: Appropriate, Attentive, Sharing and Supportive  Affect: Euthymic  Cognitive: Appropriate   Additional Comments: Activity: Flip Flop ; Explanation: Patients were asked to stand on a sheet, working together as a group patients were asked to flip the sheet over without stepping off the sheet.   Patient actively participated in group activity. Patient offered suggestions to group to accomplishing task. Patient participated in wrap up discussion about skills needed to complete activity. Patient shared personal stories with the group, as well as related to stories shared by her peers.   Talon Witting L Shakita Keir, LRT/CTRS  Coleston Dirosa L 10/08/2012 4:24 PM 

## 2012-10-08 NOTE — BHH Counselor (Signed)
Adult Psychosocial Assessment Update Interdisciplinary Team  Previous [facility] admissions/discharges:  Admissions Discharges  Date:08/18/12 Date: 08/21/12  Date: Date:  Date: Date:  Date: Date:  Date: Date:   Changes since the last Psychosocial Assessment (including adherence to outpatient mental health and/or substance abuse treatment, situational issues contributing to decompensation and/or relapse). Patient reports she did not follow up with after care appointments when last here in April  Of this year.  Reports friend took her to hospital as she had thoughts of self harm earlier   This week. She has not been compliant with discharge medications and reports suicidal  Ideations comes up as response to panic attacks.        Discharge Plan 1. Will you be returning to the same living situation after discharge?   Yes: X No:      If no, what is your plan?    Patient is considering going to live with father as he has offered place for her to stay and  She feels he is good support.  She has agreed to let him know on May 11 and sees her  Good job here as the only deciding factor or "I would have already gone."   2. Would you like a referral for services when you are discharged? Yes:  X   If yes, for what services?  No:       [facility]       Summary and Recommendations (to be completed by the evaluator) Patient is 37 YO divorced employed caucasian female admitted with diagnosis of   Post Traumatic Stress Disorder. She reports main stressor is panic attacks which   Occur in mornings and late at night.  Patient will benefit from crisis stabilization,   Medication evaluation, group therapy and psycho education in addition to discharge  Planning.                Signature:  Clide Dales, 10/08/2012 3:49 PM
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
## 2012-10-08 NOTE — Progress Notes (Signed)
Patient ID: Heidi Pearson, female   DOB: Dec 26, 1975, 37 y.o.   MRN: [id number]  D: Patient interacting well with peers on unit. Pt attending all group sessions. Pt anxious about taking medications, but was compliant. A: Monitor pt Q 15 minutes for safety, encourage staff/peer interaction and med compliance. R: Pt remains cooperative with care; no distress noted at this time.

## 2012-10-09 DIAGNOSIS — F909 Attention-deficit hyperactivity disorder, unspecified type: Secondary | ICD-10-CM | POA: Diagnosis present

## 2012-10-09 MED ORDER — AMPHETAMINE-DEXTROAMPHETAMINE 10 MG PO TABS
20.0000 mg | ORAL_TABLET | Freq: Every day | ORAL | Status: DC
  Administered 2012-10-10 – 2012-10-11 (×2): 20 mg via ORAL
  Filled 2012-10-09 (×2): qty 2

## 2012-10-09 MED ORDER — AMPHETAMINE-DEXTROAMPHETAMINE 10 MG PO TABS: 10.0000 mg | ORAL_TABLET | Freq: Every morning | ORAL | Status: DC

## 2012-10-09 MED ORDER — AMPHETAMINE-DEXTROAMPHETAMINE 10 MG PO TABS
10.0000 mg | ORAL_TABLET | Freq: Every day | ORAL | Status: DC
  Administered 2012-10-10: 10 mg via ORAL
  Filled 2012-10-09: qty 1

## 2012-10-09 MED ORDER — PAROXETINE HCL 20 MG PO TABS
20.0000 mg | ORAL_TABLET | Freq: Every day | ORAL | Status: DC
  Administered 2012-10-10 – 2012-10-11 (×2): 20 mg via ORAL
  Filled 2012-10-09: qty 14
  Filled 2012-10-09 (×4): qty 1

## 2012-10-09 NOTE — Tx Team (Signed)
Interdisciplinary Treatment Plan Update (Adult)  Date: 10/09/2012  Time Reviewed: 9:51 AM   Progress in Treatment: Attending groups: Yes Participating in groups: Yes Taking medication as prescribed:  Yes Tolerating medication:  Yes Family/Significant othe contact made: Not as yet Patient understands diagnosis: Yes Discussing patient identified problems/goals with staff: Yes Medical problems stabilized or resolved:  Yes Denies suicidal/homicidal ideation: Yes Patient has not harmed self or Others: Yes  New problem(s) identified: None Identified  Discharge Plan or Barriers:  CSW to refer patient for medication management  Additional comments: N/A  Reason for Continuation of Hospitalization: Anxiety Medication stabilization  Estimated length of stay: 1-2 days  For review of initial/current patient goals, please see plan of care.  Attendees: Patient:     Family:     Physician:  Geoffery Lyons 10/09/2012 9:51 AM   Nursing:   Robbie Louis, RN 10/09/2012 9:51 AM   Clinical Social Worker Ronda Fairly 10/09/2012 9:51 AM   Other:  Liliane Bade, Community Care Coordinator 10/09/2012 9:51 AM   Other:  Harold Barban, RN 10/09/2012 9:51 AM   Other:  Serena Colonel, NP 10/09/2012 9:51 AM   Other:      Scribe for Treatment Team:   Carney Bern, LCSWA  10/09/2012 9:51 AM

## 2012-10-09 NOTE — Progress Notes (Signed)
D: Patient in day room at the beginning of the shift. Her mood and affects appropriate. She reported that feeling somewhat anxious about her medication change in the morning; not sure if the doctor would change it or not. She denied SI/HI and denied hallucinations. Reported she slept very well last and that the Trazodone really helped her to sleep. Wants Adderall increased to 20 mg in the morning and 10 mg in the afternoon A: Writer encouraged and supported patient. R: Q 15 minute check continues as ordered to maintain safety.

## 2012-10-09 NOTE — Progress Notes (Signed)
Pt reported her sleep as fair. Her appetite good energy low and ability to pay attention as poor.  Depression 5 hopelessness 5 and anxiety a 7 on her self-inventory.  Denies any S/H ideations or A/V hallucinations.  She plans to f/u at her P[facility]. Pt is taking klonopin and had her adderall increased to 20 mg to start tomorrow and her paxil to 20 mg too.  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  She plans to discharge either tomorrow or early Friday to meet her appointment with her providers.

## 2012-10-09 NOTE — Progress Notes (Signed)
[facility] MD Progress Note  10/09/2012 2:35 PM Heidi Pearson  MRN:  [id number] Subjective:  Sates she was sedated when she took the first dosages of Adderall. She did sleep with the Trazadone. She states she had to adjust to the medications once she started taking them in the past. She states that the Adderall and a benzodiazepine in the morning was the regime that help her the most. She was able to deal better with the day. Also expressed that the anxiety was more manageable. She states she is ready to move on and do the right things.  Diagnosis:  PTSD, Panic Attacks, MDD, ADHD  ADL's:  Intact  Sleep: Fair  Appetite:  Fair  Suicidal Ideation:  Plan:  denies Intent:  denies Means:  denies Homicidal Ideation:  Plan:  denies Intent:  denies Means:  denies AEB (as evidenced by):  Psychiatric Specialty Exam: Review of Systems  Psychiatric/Behavioral: Positive for depression. The patient is nervous/anxious.     Blood pressure 118/74, pulse 99, temperature 98.2 F (36.8 C), temperature source Oral, resp. rate 16, height 5' 4.5" (1.638 m), weight 95.709 kg (211 lb), last menstrual period 09/20/2012, SpO2 100.00%.Body mass index is 35.67 kg/(m^2).  General Appearance: Fairly Groomed  Patent attorney::  Fair  Speech:  Clear and Coherent  Volume:  fluctuates  Mood:  Anxious  Affect:  anxious  Thought Process:  Coherent and Goal Directed  Orientation:  Full (Time, Place, and Person)  Thought Content:  worries, concerns, symptom focused  Suicidal Thoughts:  No  Homicidal Thoughts:  No  Memory:  Immediate;   Fair Recent;   Fair Remote;   Fair  Judgement:  Fair  Insight:  Present  Psychomotor Activity:  Restlessness  Concentration:  Fair  Recall:  Fair  Akathisia:  No  Handed:  Right  AIMS (if indicated):     Assets:  Desire for Improvement Housing  Sleep:  Number of Hours: 6.25   Current Medications: Current Facility-Administered Medications  Medication Dose Route Frequency  Provider Last Rate Last Dose  . acetaminophen (TYLENOL) tablet 650 mg  650 mg Oral Q6H PRN Heidi Hough, PA-C      . alum & mag hydroxide-simeth (MAALOX/MYLANTA) 200-200-20 MG/5ML suspension 30 mL  30 mL Oral Q4H PRN Heidi Hough, PA-C      . amphetamine-dextroamphetamine (ADDERALL) tablet 10 mg  10 mg Oral BID WC Heidi Fee, MD   10 mg at 10/09/12 1332  . chlordiazePOXIDE (LIBRIUM) capsule 25 mg  25 mg Oral QID PRN Heidi Hough, PA-C   25 mg at 10/08/12 1478  . clonazePAM (KLONOPIN) tablet 0.5 mg  0.5 mg Oral BID Heidi Fee, MD   0.5 mg at 10/09/12 0819  . ibuprofen (ADVIL,MOTRIN) tablet 600 mg  600 mg Oral Q6H PRN Heidi Hough, PA-C      . magnesium hydroxide (MILK OF MAGNESIA) suspension 30 mL  30 mL Oral Daily PRN Heidi Hough, PA-C      . [START ON 10/10/2012] PARoxetine (PAXIL) tablet 20 mg  20 mg Oral Daily Heidi Fee, MD      . traZODone (DESYREL) tablet 50 mg  50 mg Oral QHS,MR X 1 Heidi Hough, PA-C   50 mg at 10/08/12 2140    Lab Results: No results found for this or any previous visit (from the past 48 hour(s)).  Physical Findings: AIMS: Facial and Oral Movements Muscles of Facial Expression: None, normal Lips and Perioral Area: None,  normal Jaw: None, normal Tongue: None, normal,Extremity Movements Upper (arms, wrists, hands, fingers): None, normal Lower (legs, knees, ankles, toes): None, normal, Trunk Movements Neck, shoulders, hips: None, normal, Overall Severity Severity of abnormal movements (highest score from questions above): None, normal Incapacitation due to abnormal movements: None, normal Patient's awareness of abnormal movements (rate only patient's report): No Awareness, Dental Status Current problems with teeth and/or dentures?: No Does patient usually wear dentures?: No  CIWA:    COWS:     Treatment Plan Summary: Daily contact with patient to assess and evaluate symptoms and progress in treatment Medication management  Plan:  Supportive approach/coping skills/relapse prevention           CBT to deal with the anxiety           Increase the Paxil to 20 mg daily           Increase the AM Adderall to 20 mg  Medical Decision Making Problem Points:  Review of psycho-social stressors (1) Data Points:  Review of medication regiment & side effects (2) Review of new medications or change in dosage (2)  I certify that inpatient services furnished can reasonably be expected to improve the patient's condition.   Heidi Pearson A 10/09/2012, 2:35 PM

## 2012-10-09 NOTE — BHH Group Notes (Signed)
[facility] LCSW Aftercare Discharge Planning Group Note   10/09/2012 8:45 AM  Participation Quality:  Appropriate  Mood/Affect:  Anxious  Depression Rating:  3  Anxiety Rating:  6  Thoughts of Suicide:  No Will you contract for safety?   NA  Current AVH:  No  Plan for Discharge/Comments:  Follow up with [facility] for medication management  Transportation Means: Friend  Supports: Friends and family  Dyane Dustman, Julious Payer

## 2012-10-09 NOTE — [facility] Group Notes (Signed)
[facility] LCSW Group Therapy  10/09/2012  1:15 PM  Type of Therapy: Group Therapy 1:15 to 2:30 PM  Participation Level:  Active  Participation Quality: Attentive and sharing  Affect: Appropriate  Cognitive:  Appropriate  Insight: Improving  Engagement in Therapy:    Modes of Intervention:  Discussion, exploration, socialization and support  Summary of Progress/Problems: The focus of this group session was to process how we deal with difficult emotions and share with others the patterns that play out when we are reacting to the emotion verses the situation.  Heidi Pearson shared that since she got clean 1 year ago today (others in group offered congratualtions)  All emotions have seemed overwhelming which opened up opportunity to process emotions and emotional reactions can often lead to relapse.  Heidi Pearson later processed how frustration often leads to indecision and/or inaction for her.   Clide Dales

## 2012-10-10 DIAGNOSIS — F909 Attention-deficit hyperactivity disorder, unspecified type: Secondary | ICD-10-CM

## 2012-10-10 DIAGNOSIS — F410 Panic disorder [episodic paroxysmal anxiety] without agoraphobia: Principal | ICD-10-CM

## 2012-10-10 DIAGNOSIS — F4310 Post-traumatic stress disorder, unspecified: Secondary | ICD-10-CM

## 2012-10-10 DIAGNOSIS — F329 Major depressive disorder, single episode, unspecified: Secondary | ICD-10-CM

## 2012-10-10 NOTE — [facility] Group Notes (Signed)
[facility] LCSW Aftercare Discharge Planning Group Note   10/10/2012 8:45 AM  Participation Quality:  Appropriate  Mood/Affect:  Anxious  Depression Rating:  2  Anxiety Rating:  8, "nothing is going my way today but expect the number to go down"  Thoughts of Suicide:  No Will you contract for safety?   NA  Current AVH:  No  Plan for Discharge/Comments:  Follow up with [facility] for medication management on Friday at 10 AM  Transportation Means: Friend  Supports: Friends and family  Dyane Dustman, Julious Payer

## 2012-10-10 NOTE — Progress Notes (Signed)
D) Patient appears brighter today, interacting with peers and staff. Pt. Denies SI/HI thoughts, patient denies AVH. Denies withdrawal symptoms.  A) Patient offered support and encouragement. Encouraged to discuss  Questions/concerns with MD/NP. Patient verbalized understanding.  R) Patient attending groups taking meds as ordered. Will continue to monitor.

## 2012-10-10 NOTE — Progress Notes (Signed)
Patient ID: Heidi Pearson, female   DOB: [birth year], 37 y.o.   MRN: [id number] [facility] MD Progress Note  10/10/2012 1:07 PM Heidi Pearson  MRN:  [id number]  Subjective:  Heidi Pearson reports today that she does not feel like she is ready to be discharge today. She adds that she is feeling both dizzy and light headed. Believed that it is coming from her body trying to adjust to the medicines. Currently denies any suicidal homicidal ideations. 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  Diagnosis:  PTSD, Panic Attacks, MDD, ADHD  ADL's:  Intact  Sleep: Fair  Appetite:  Fair  Suicidal Ideation:  Plan:  denies Intent:  denies Means:  denies Homicidal Ideation:  Plan:  denies Intent:  denies Means:  denies  AEB (as evidenced by): Per patient's reports.  Psychiatric Specialty Exam: Review of Systems  Psychiatric/Behavioral: Positive for depression. The patient is nervous/anxious.     Blood pressure 115/74, pulse 100, temperature 98.3 F (36.8 C), temperature source Oral, resp. rate 16, height 5' 4.5" (1.638 m), weight 95.709 kg (211 lb), last menstrual period 09/20/2012, SpO2 100.00%.Body mass index is 35.67 kg/(m^2).  General Appearance: Fairly Groomed  Patent attorney::  Fair  Speech:  Clear and Coherent  Volume:  fluctuates  Mood:  Anxious  Affect:  anxious  Thought Process:  Coherent and Goal Directed  Orientation:  Full (Time, Place, and Person)  Thought Content:  worries, concerns, symptom focused  Suicidal Thoughts:  No  Homicidal Thoughts:  No  Memory:  Immediate;   Fair Recent;   Fair Remote;   Fair  Judgement:  Fair  Insight:  Present  Psychomotor Activity:  Restlessness  Concentration:  Fair  Recall:  Fair  Akathisia:  No  Handed:  Right  AIMS (if indicated):     Assets:  Desire for Improvement Housing  Sleep:  Number of Hours: 5   Current Medications: Current Facility-Administered  Medications  Medication Dose Route Frequency Provider Last Rate Last Dose  . acetaminophen (TYLENOL) tablet 650 mg  650 mg Oral Q6H PRN Kerry Hough, PA-C      . alum & mag hydroxide-simeth (MAALOX/MYLANTA) 200-200-20 MG/5ML suspension 30 mL  30 mL Oral Q4H PRN Kerry Hough, PA-C      . amphetamine-dextroamphetamine (ADDERALL) tablet 10 mg  10 mg Oral QPC lunch Rachael Fee, MD   10 mg at 10/10/12 1307  . amphetamine-dextroamphetamine (ADDERALL) tablet 20 mg  20 mg Oral Q breakfast Rachael Fee, MD   20 mg at 10/10/12 0701  . clonazePAM (KLONOPIN) tablet 0.5 mg  0.5 mg Oral BID Rachael Fee, MD   0.5 mg at 10/10/12 7829  . ibuprofen (ADVIL,MOTRIN) tablet 600 mg  600 mg Oral Q6H PRN Kerry Hough, PA-C      . magnesium hydroxide (MILK OF MAGNESIA) suspension 30 mL  30 mL Oral Daily PRN Kerry Hough, PA-C      . PARoxetine (PAXIL) tablet 20 mg  20 mg Oral Daily Rachael Fee, MD   20 mg at 10/10/12 [phone]  . traZODone (DESYREL) tablet 50 mg  50 mg Oral QHS,MR X 1 Kerry Hough, PA-C   50 mg at 10/09/12 2302    Lab Results: No results found for this or any previous visit (from the past 48 hour(s)).  Physical Findings: AIMS: Facial and Oral Movements Muscles of Facial Expression: None, normal  Lips and Perioral Area: None, normal Jaw: None, normal Tongue: None, normal,Extremity Movements Upper (arms, wrists, hands, fingers): None, normal Lower (legs, knees, ankles, toes): None, normal, Trunk Movements Neck, shoulders, hips: None, normal, Overall Severity Severity of abnormal movements (highest score from questions above): None, normal Incapacitation due to abnormal movements: None, normal Patient's awareness of abnormal movements (rate only patient's report): No Awareness, Dental Status Current problems with teeth and/or dentures?: No Does patient usually wear dentures?: No  CIWA:    COWS:     Treatment Plan Summary: Daily contact with patient to assess and evaluate symptoms  and progress in treatment Medication management  Plan: Supportive approach/coping skills/relapse prevention CBT to deal with the anxiety Continue Paxil to 20 mg daily Continue AM Adderall to 20 mg . Discharge plans in progress as discharge is possible tomorrow 10/11/12.  Medical Decision Making Problem Points:  Review of psycho-social stressors (1) Data Points:  Review of medication regiment & side effects (2) Review of new medications or change in dosage (2)  I certify that inpatient services furnished can reasonably be expected to improve the patient's condition.   Armandina Stammer I, PMHNP-BC 10/10/2012, 1:07 PM

## 2012-10-10 NOTE — [facility] Group Notes (Signed)
[facility] LCSW Group Therapy  10/10/2012 1:15 PM  Type of Therapy:  Group Therapy 1:15 to 2:30 PM  Participation Level:  Active  Participation Quality:  Appropriate  Affect:  Appropriate  Cognitive:  Appropriate  Insight:  Developing/Improving  Engagement in Therapy:  Engaged  Modes of Intervention:  Discussion, Exploration, Rapport Building, Socialization and Support  Summary of Progress/Problems:Focus of group processing discussion was on balance in life; the components in life which have a negative influence on balance and the components which make for a more balanced life.  Patient was able to process how addiction limited her life in the bast and actually kept her from enjoying life as opposed to her belief that drugs were actually what she enjoyed. She processed how "life may feel much more balanced if I can stay in the moment or at least in the day."  [place], Julious Payer

## 2012-10-10 NOTE — Progress Notes (Signed)
Adult Psychoeducational Group Note  Date:  10/10/2012 Time:  11:00am Group Topic/Focus:  Overcoming Stress:   The focus of this group is to define stress and help patients assess their triggers.  Participation Level:  Active  Participation Quality:  Appropriate, Attentive, Sharing and Supportive  Affect:  Appropriate  Cognitive:  Alert and Appropriate  Insight: Appropriate  Engagement in Group:  Engaged  Modes of Intervention:  Discussion and Education  Additional Comments:  Patient attented group.  Shelly Bombard D 10/10/2012, 1:26 PM

## 2012-10-10 NOTE — Progress Notes (Signed)
D: She appeared very sad and irritable at the beginning of the shift. She reported that the physician promised to order B-12 for her and that wasn't done. She appeared anxious and angry. She denied SI/HI and denied hallucinations.  A: Writer encourage and supported patient. Encouraged patient to report to her doctor in the morning. R: Patient receptive to encouragement and support. Q 15 minute check continues as ordered to maintain safety.

## 2012-10-10 NOTE — Progress Notes (Signed)
D) Patient appears brighter today, interacting with peers and staff. Pt. Denies SI/HI thoughts, patient denies AVH. Denies withdrawal symptoms.  A) Patient offered support and encouragement. Encouraged to discuss  Questions/concerns with MD/NP. Patient verbalized understanding.  R) Patient attending groups taking meds as ordered. Will continue to monitor.  
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
## 2012-10-11 DIAGNOSIS — F339 Major depressive disorder, recurrent, unspecified: Secondary | ICD-10-CM

## 2012-10-11 MED ORDER — CYANOCOBALAMIN 1000 MCG/ML IJ SOLN
1000.0000 ug | Freq: Once | INTRAMUSCULAR | Status: AC
  Administered 2012-10-11: 1000 ug via INTRAMUSCULAR
  Filled 2012-10-11: qty 1

## 2012-10-11 MED ORDER — AMPHETAMINE-DEXTROAMPHETAMINE 10 MG PO TABS: ORAL_TABLET | ORAL | Status: DC

## 2012-10-11 MED ORDER — TRAZODONE HCL 50 MG PO TABS: 50.0000 mg | ORAL_TABLET | Freq: Every evening | ORAL | Status: DC | PRN

## 2012-10-11 MED ORDER — CLONAZEPAM 0.5 MG PO TABS: 0.5000 mg | ORAL_TABLET | Freq: Two times a day (BID) | ORAL | Status: DC

## 2012-10-11 MED ORDER — PAROXETINE HCL 20 MG PO TABS: 20.0000 mg | ORAL_TABLET | Freq: Every day | ORAL | Status: DC

## 2012-10-11 NOTE — Progress Notes (Signed)
[name] :  Will you be returning to the same living situation after discharge: Yes,  with significant other At discharge, do you have transportation home?:Yes,  significant other Do you have the ability to pay for your medications:Yes,  at [facility]  Release of information consent forms completed and in the chart;  Patient's signature needed at discharge.  Patient to Follow up at: Follow-up Information   Follow up with [facility] On 10/11/2012. (Appointment on Friday May 30th at 10AM)    Contact information:   [address] [place], [place] [address] [phone] FAX [phone]      Patient denies SI/HI:   Yes,  Denied both    Safety Planning and Suicide Prevention discussed:  Yes,  with significant other  Heidi Pearson 10/11/2012, 5:14 PM
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
## 2012-10-11 NOTE — [facility] Suicide Risk Assessment (Signed)
[facility] INPATIENT:  Family/Significant Other Suicide Prevention Education  Suicide Prevention Education:  Education Completed; Laurel Dimmer at [phone] has been identified by the patient as the family member/significant other with whom the patient will be residing, and identified as the person(s) who will aid the patient in the event of a mental health crisis (suicidal ideations/suicide attempt).  With written consent from the patient, the significant other was provided the following suicide prevention education in waiting room at [facility] as he waited for patient to discharge. The conversation occurred at 9:35 AM  The suicide prevention education provided includes the following:  Suicide risk factors  Suicide prevention and interventions  National Suicide Hotline telephone number  [facility] assessment telephone number  [facility] Emergency Assistance 911  [facility] and/or Residential Mobile Crisis Unit telephone number  Request made of family/significant other to:  Remove weapons (e.g., guns, rifles, knives), all items previously/currently identified as safety concern.  Elisha Ponder reports there are no firearms in the home.  Remove drugs/medications (over-the-counter, prescriptions, illicit drugs), all items previously/currently identified as a safety concern.  The family member/significant other verbalizes understanding of the suicide prevention education information provided.  The family member/significant other agrees to remove the items of safety concern listed above.  Once patient was discharged she joined conversation and both parties agreed that mental health issues affect their relationship. In response to couples request for counseling they were given instructions on self referral to [facility] of the [place].   Clide Dales 10/11/2012, 5:04 PM

## 2012-10-11 NOTE — Progress Notes (Signed)
Patient did attend the evening karaoke group. Pt was engaged, supportive, and participated by singing two songs with a peer pt.

## 2012-10-11 NOTE — [facility] Group Notes (Signed)
[facility] LCSW Aftercare Discharge Planning Group Note   10/11/2012  8:45 AM  Participation Quality:  Appropriate  Mood/Affect:  Anxious  Depression Rating:  0  Anxiety Rating:  3  Thoughts of Suicide:  No Will you contract for safety?   NA  Current AVH:  No  Plan for Discharge/Comments:  Follow up at [facility] today at 10:00 AM  Transportation Means: Significant other  Supports:  Significant other and father and employeer  Dyane Dustman, Julious Payer

## 2012-10-11 NOTE — Progress Notes (Signed)
Discharged from hospital w/med samples, scripts, signed information sheet, signed discharge paperwork, denies SI or HI and no AVH. Is happy to be leaving and wants to keep up w/her sobriety. Voicing no complaints. Has appointment when she leaves here this morning.
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
## 2012-10-15 NOTE — Progress Notes (Signed)
Patient Discharge Instructions:  After Visit Summary (AVS):   Faxed to:  10/15/12 Discharge Summary Note:   Faxed to:  10/15/12 Psychiatric Admission Assessment Note:   Faxed to:  10/15/12 Suicide Risk Assessment - Discharge Assessment:   Faxed to:  10/15/12 Faxed/Sent to the Next Level Care provider:  10/15/12 Faxed to [facility] & [facility] @ [phone]  Jerelene Redden, 10/15/2012, 2:54 PM

## 2013-01-25 ENCOUNTER — Emergency Department (HOSPITAL_COMMUNITY)
Admission: EM | Admit: 2013-01-25 | Discharge: 2013-01-25 | Disposition: A | Discharge status: Home / Self Care | Payer: Self-pay | Attending: Emergency Medicine | Admitting: Emergency Medicine

## 2013-01-25 ENCOUNTER — Emergency Department (HOSPITAL_COMMUNITY): Payer: Self-pay

## 2013-01-25 ENCOUNTER — Encounter (HOSPITAL_COMMUNITY): Payer: Self-pay | Admitting: Emergency Medicine

## 2013-01-25 DIAGNOSIS — Z792 Long term (current) use of antibiotics: Secondary | ICD-10-CM | POA: Insufficient documentation

## 2013-01-25 DIAGNOSIS — Z8739 Personal history of other diseases of the musculoskeletal system and connective tissue: Secondary | ICD-10-CM | POA: Insufficient documentation

## 2013-01-25 DIAGNOSIS — Z8659 Personal history of other mental and behavioral disorders: Secondary | ICD-10-CM | POA: Insufficient documentation

## 2013-01-25 DIAGNOSIS — T7411XA Adult physical abuse, confirmed, initial encounter: Secondary | ICD-10-CM | POA: Insufficient documentation

## 2013-01-25 DIAGNOSIS — S0990XA Unspecified injury of head, initial encounter: Secondary | ICD-10-CM | POA: Insufficient documentation

## 2013-01-25 DIAGNOSIS — S0993XA Unspecified injury of face, initial encounter: Secondary | ICD-10-CM | POA: Insufficient documentation

## 2013-01-25 DIAGNOSIS — T07XXXA Unspecified multiple injuries, initial encounter: Secondary | ICD-10-CM | POA: Insufficient documentation

## 2013-01-25 MED ORDER — NAPROXEN 500 MG PO TABS: 500.0000 mg | ORAL_TABLET | Freq: Two times a day (BID) | ORAL | Status: DC

## 2013-01-25 MED ORDER — OXYCODONE-ACETAMINOPHEN 5-325 MG PO TABS
1.0000 | ORAL_TABLET | Freq: Once | ORAL | Status: AC
  Administered 2013-01-25: 1 via ORAL
  Filled 2013-01-25: qty 1

## 2013-01-25 MED ORDER — HYDROCODONE-ACETAMINOPHEN 5-325 MG PO TABS: 2.0000 | ORAL_TABLET | Freq: Four times a day (QID) | ORAL | Status: DC | PRN

## 2013-01-25 NOTE — ED Notes (Signed)
Tearful, reports pain worsening.  Physician aware.  Pt becoming anxious and beginning to become elevated over her situation.
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
## 2013-01-25 NOTE — ED Notes (Signed)
Pt remains alert, c/o increased pain left side of face as time goes on. Awaiting CT

## 2013-02-02 ENCOUNTER — Emergency Department (HOSPITAL_COMMUNITY)
Admission: EM | Admit: 2013-02-02 | Discharge: 2013-02-03 | Disposition: A | Discharge status: Home / Self Care | Payer: Self-pay | Attending: Emergency Medicine | Admitting: Emergency Medicine

## 2013-02-02 ENCOUNTER — Encounter (HOSPITAL_COMMUNITY): Payer: Self-pay | Admitting: Emergency Medicine

## 2013-02-02 ENCOUNTER — Encounter (HOSPITAL_COMMUNITY): Payer: Self-pay | Admitting: *Deleted

## 2013-02-02 ENCOUNTER — Emergency Department (HOSPITAL_COMMUNITY)
Admission: EM | Admit: 2013-02-02 | Discharge: 2013-02-02 | Disposition: A | Discharge status: Home / Self Care | Payer: Self-pay | Attending: Emergency Medicine | Admitting: Emergency Medicine

## 2013-02-02 DIAGNOSIS — F411 Generalized anxiety disorder: Secondary | ICD-10-CM | POA: Insufficient documentation

## 2013-02-02 DIAGNOSIS — R21 Rash and other nonspecific skin eruption: Secondary | ICD-10-CM | POA: Insufficient documentation

## 2013-02-02 DIAGNOSIS — S7000XA Contusion of unspecified hip, initial encounter: Secondary | ICD-10-CM | POA: Insufficient documentation

## 2013-02-02 DIAGNOSIS — F329 Major depressive disorder, single episode, unspecified: Secondary | ICD-10-CM | POA: Insufficient documentation

## 2013-02-02 DIAGNOSIS — F3289 Other specified depressive episodes: Secondary | ICD-10-CM | POA: Insufficient documentation

## 2013-02-02 DIAGNOSIS — S1093XA Contusion of unspecified part of neck, initial encounter: Secondary | ICD-10-CM | POA: Insufficient documentation

## 2013-02-02 DIAGNOSIS — IMO0002 Reserved for concepts with insufficient information to code with codable children: Secondary | ICD-10-CM | POA: Insufficient documentation

## 2013-02-02 DIAGNOSIS — Z79899 Other long term (current) drug therapy: Secondary | ICD-10-CM | POA: Insufficient documentation

## 2013-02-02 DIAGNOSIS — G8929 Other chronic pain: Secondary | ICD-10-CM | POA: Insufficient documentation

## 2013-02-02 DIAGNOSIS — S20229A Contusion of unspecified back wall of thorax, initial encounter: Secondary | ICD-10-CM | POA: Insufficient documentation

## 2013-02-02 DIAGNOSIS — S7002XA Contusion of left hip, initial encounter: Secondary | ICD-10-CM

## 2013-02-02 DIAGNOSIS — S0083XA Contusion of other part of head, initial encounter: Secondary | ICD-10-CM

## 2013-02-02 DIAGNOSIS — S300XXA Contusion of lower back and pelvis, initial encounter: Secondary | ICD-10-CM

## 2013-02-02 DIAGNOSIS — S0003XA Contusion of scalp, initial encounter: Secondary | ICD-10-CM | POA: Insufficient documentation

## 2013-02-02 DIAGNOSIS — Z792 Long term (current) use of antibiotics: Secondary | ICD-10-CM | POA: Insufficient documentation

## 2013-02-02 DIAGNOSIS — Z3202 Encounter for pregnancy test, result negative: Secondary | ICD-10-CM | POA: Insufficient documentation

## 2013-02-02 MED ORDER — CLONAZEPAM 0.5 MG PO TABS
0.5000 mg | ORAL_TABLET | Freq: Once | ORAL | Status: AC
  Administered 2013-02-02: 0.5 mg via ORAL
  Filled 2013-02-02: qty 1

## 2013-02-02 MED ORDER — IBUPROFEN 600 MG PO TABS: 600.0000 mg | ORAL_TABLET | Freq: Four times a day (QID) | ORAL | Status: DC | PRN

## 2013-02-02 MED ORDER — CLONAZEPAM 0.5 MG PO TABS: 0.5000 mg | ORAL_TABLET | Freq: Two times a day (BID) | ORAL | Status: DC | PRN

## 2013-02-02 MED ORDER — LORAZEPAM 2 MG/ML IJ SOLN
2.0000 mg | Freq: Once | INTRAMUSCULAR | Status: AC
  Administered 2013-02-02: 2 mg via INTRAMUSCULAR
  Filled 2013-02-02: qty 1

## 2013-02-02 MED ORDER — ACETAMINOPHEN 325 MG PO TABS: 650.0000 mg | ORAL_TABLET | Freq: Once | ORAL | Status: DC
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
## 2013-02-03 ENCOUNTER — Emergency Department (HOSPITAL_COMMUNITY): Payer: Self-pay

## 2013-02-03 MED ORDER — OXYCODONE-ACETAMINOPHEN 5-325 MG PO TABS: 1.0000 | ORAL_TABLET | ORAL | Status: DC | PRN

## 2013-02-03 MED ORDER — IBUPROFEN 800 MG PO TABS
800.0000 mg | ORAL_TABLET | Freq: Once | ORAL | Status: AC
  Administered 2013-02-03: 800 mg via ORAL
  Filled 2013-02-03: qty 1

## 2013-02-03 MED ORDER — OXYCODONE-ACETAMINOPHEN 5-325 MG PO TABS
1.0000 | ORAL_TABLET | Freq: Once | ORAL | Status: AC
  Administered 2013-02-03: 1 via ORAL
  Filled 2013-02-03: qty 1

## 2013-02-03 NOTE — ED Notes (Signed)
Have been at bedside with pt for long discussion on delay, she has slammed door coming out of restroom,  Flopped down in chair and now talking on phone, rolling eyes
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
## 2013-02-06 ENCOUNTER — Emergency Department (HOSPITAL_COMMUNITY): Payer: Self-pay

## 2013-02-06 ENCOUNTER — Encounter (HOSPITAL_COMMUNITY): Payer: Self-pay | Admitting: Emergency Medicine

## 2013-02-06 ENCOUNTER — Emergency Department (HOSPITAL_COMMUNITY)
Admission: EM | Admit: 2013-02-06 | Discharge: 2013-02-06 | Disposition: A | Discharge status: Home / Self Care | Payer: Self-pay | Attending: Emergency Medicine | Admitting: Emergency Medicine

## 2013-02-06 DIAGNOSIS — R51 Headache: Secondary | ICD-10-CM | POA: Insufficient documentation

## 2013-02-06 DIAGNOSIS — M542 Cervicalgia: Secondary | ICD-10-CM | POA: Insufficient documentation

## 2013-02-06 DIAGNOSIS — F411 Generalized anxiety disorder: Secondary | ICD-10-CM | POA: Insufficient documentation

## 2013-02-06 DIAGNOSIS — S0083XD Contusion of other part of head, subsequent encounter: Secondary | ICD-10-CM

## 2013-02-06 DIAGNOSIS — Z79899 Other long term (current) drug therapy: Secondary | ICD-10-CM | POA: Insufficient documentation

## 2013-02-06 DIAGNOSIS — H538 Other visual disturbances: Secondary | ICD-10-CM | POA: Insufficient documentation

## 2013-02-06 DIAGNOSIS — H53149 Visual discomfort, unspecified: Secondary | ICD-10-CM | POA: Insufficient documentation

## 2013-02-06 DIAGNOSIS — G8929 Other chronic pain: Secondary | ICD-10-CM | POA: Insufficient documentation

## 2013-02-06 MED ORDER — MORPHINE SULFATE 2 MG/ML IJ SOLN
2.0000 mg | Freq: Once | INTRAMUSCULAR | Status: AC
  Administered 2013-02-06: 2 mg via INTRAMUSCULAR
  Filled 2013-02-06: qty 1

## 2013-02-06 MED ORDER — OXYCODONE-ACETAMINOPHEN 5-325 MG PO TABS: 1.0000 | ORAL_TABLET | Freq: Four times a day (QID) | ORAL | Status: DC | PRN

## 2013-02-06 MED ORDER — LORAZEPAM 2 MG/ML IJ SOLN
1.0000 mg | Freq: Once | INTRAMUSCULAR | Status: AC
  Administered 2013-02-06: 1 mg via INTRAMUSCULAR
  Filled 2013-02-06: qty 1

## 2013-02-06 MED ORDER — IBUPROFEN 400 MG PO TABS
800.0000 mg | ORAL_TABLET | Freq: Once | ORAL | Status: AC
  Administered 2013-02-06: 800 mg via ORAL
  Filled 2013-02-06: qty 2
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
## 2013-02-07 NOTE — ED Notes (Signed)
Pt sts she will get a taxi to take her home.

## 2013-02-07 NOTE — ED Provider Notes (Signed)
Medical screening examination/treatment/procedure(s) were performed by non-physician practitioner and as supervising physician I was immediately available for consultation/collaboration.   Junius Argyle, MD 02/07/13 1315

## 2013-02-16 ENCOUNTER — Emergency Department (HOSPITAL_COMMUNITY)
Admission: EM | Admit: 2013-02-16 | Discharge: 2013-02-16 | Disposition: A | Discharge status: Home / Self Care | Payer: Self-pay | Attending: Emergency Medicine | Admitting: Emergency Medicine

## 2013-02-16 ENCOUNTER — Emergency Department (HOSPITAL_COMMUNITY)
Admission: EM | Admit: 2013-02-16 | Discharge: 2013-02-17 | Disposition: A | Discharge status: Other Acute Inpatient Hospital | Payer: Self-pay | Attending: Emergency Medicine | Admitting: Emergency Medicine

## 2013-02-16 ENCOUNTER — Encounter (HOSPITAL_COMMUNITY): Payer: Self-pay | Admitting: *Deleted

## 2013-02-16 ENCOUNTER — Encounter (HOSPITAL_COMMUNITY): Payer: Self-pay | Admitting: Cardiology

## 2013-02-16 DIAGNOSIS — F411 Generalized anxiety disorder: Secondary | ICD-10-CM | POA: Insufficient documentation

## 2013-02-16 DIAGNOSIS — F419 Anxiety disorder, unspecified: Secondary | ICD-10-CM

## 2013-02-16 DIAGNOSIS — R0602 Shortness of breath: Secondary | ICD-10-CM | POA: Insufficient documentation

## 2013-02-16 DIAGNOSIS — S61509A Unspecified open wound of unspecified wrist, initial encounter: Secondary | ICD-10-CM | POA: Insufficient documentation

## 2013-02-16 DIAGNOSIS — F4329 Adjustment disorder with other symptoms: Secondary | ICD-10-CM

## 2013-02-16 DIAGNOSIS — Z8739 Personal history of other diseases of the musculoskeletal system and connective tissue: Secondary | ICD-10-CM | POA: Insufficient documentation

## 2013-02-16 DIAGNOSIS — F329 Major depressive disorder, single episode, unspecified: Secondary | ICD-10-CM | POA: Insufficient documentation

## 2013-02-16 DIAGNOSIS — F3289 Other specified depressive episodes: Secondary | ICD-10-CM | POA: Insufficient documentation

## 2013-02-16 DIAGNOSIS — R450 Nervousness: Secondary | ICD-10-CM | POA: Insufficient documentation

## 2013-02-16 DIAGNOSIS — F438 Other reactions to severe stress: Secondary | ICD-10-CM | POA: Insufficient documentation

## 2013-02-16 DIAGNOSIS — X789XXA Intentional self-harm by unspecified sharp object, initial encounter: Secondary | ICD-10-CM | POA: Insufficient documentation

## 2013-02-16 DIAGNOSIS — F410 Panic disorder [episodic paroxysmal anxiety] without agoraphobia: Secondary | ICD-10-CM

## 2013-02-16 DIAGNOSIS — F4389 Other reactions to severe stress: Secondary | ICD-10-CM | POA: Insufficient documentation

## 2013-02-16 LAB — URINALYSIS, ROUTINE W REFLEX MICROSCOPIC
Bilirubin Urine: NEGATIVE
Glucose, UA: NEGATIVE mg/dL
Ketones, ur: 15 mg/dL — AB
Leukocytes, UA: NEGATIVE
Nitrite: NEGATIVE
Urobilinogen, UA: 0.2 mg/dL (ref 0.0–1.0)
pH: 5.5 (ref 5.0–8.0)

## 2013-02-16 LAB — URINE MICROSCOPIC-ADD ON

## 2013-02-16 LAB — CBC
HCT: 34.6 % — ABNORMAL LOW (ref 36.0–46.0)
Hemoglobin: 11.9 g/dL — ABNORMAL LOW (ref 12.0–15.0)
MCH: 29.2 pg (ref 26.0–34.0)
MCHC: 34.4 g/dL (ref 30.0–36.0)
MCV: 84.8 fL (ref 78.0–100.0)
Platelets: 339 10*3/uL (ref 150–400)
RDW: 13.1 % (ref 11.5–15.5)

## 2013-02-16 LAB — BASIC METABOLIC PANEL
BUN: 7 mg/dL (ref 6–23)
CO2: 23 mEq/L (ref 19–32)
Chloride: 102 mEq/L (ref 96–112)
GFR calc Af Amer: >90 mL/min (ref >90)
Potassium: 3.3 mEq/L — ABNORMAL LOW (ref 3.5–5.1)
Sodium: 140 mEq/L (ref 135–145)

## 2013-02-16 LAB — POCT PREGNANCY, URINE: Preg Test, Ur: NEGATIVE

## 2013-02-16 MED ORDER — LORAZEPAM 1 MG PO TABS: 1.0000 mg | ORAL_TABLET | ORAL | Status: DC | PRN

## 2013-02-16 MED ORDER — TETANUS-DIPHTH-ACELL PERTUSSIS 5-2.5-18.5 LF-MCG/0.5 IM SUSP: 0.5000 mL | Freq: Once | INTRAMUSCULAR | Status: DC

## 2013-02-16 MED ORDER — CLONAZEPAM 0.5 MG PO TABS
1.0000 mg | ORAL_TABLET | Freq: Once | ORAL | Status: AC
  Administered 2013-02-16: 1 mg via ORAL
  Filled 2013-02-16: qty 2

## 2013-02-16 MED ORDER — LORAZEPAM 1 MG PO TABS
1.0000 mg | ORAL_TABLET | Freq: Once | ORAL | Status: AC
  Administered 2013-02-16: 1 mg via ORAL
  Filled 2013-02-16: qty 1

## 2013-02-16 MED ORDER — LORAZEPAM 1 MG PO TABS: 1.0000 mg | ORAL_TABLET | Freq: Three times a day (TID) | ORAL | Status: DC | PRN

## 2013-02-16 MED ORDER — ZOLPIDEM TARTRATE 5 MG PO TABS
5.0000 mg | ORAL_TABLET | Freq: Once | ORAL | Status: AC
  Administered 2013-02-16: 5 mg via ORAL
  Filled 2013-02-16: qty 1

## 2013-02-16 MED ORDER — CLONAZEPAM 0.5 MG PO TABS
0.2500 mg | ORAL_TABLET | Freq: Once | ORAL | Status: AC
  Administered 2013-02-16: 0.25 mg via ORAL
  Filled 2013-02-16: qty 1

## 2013-02-16 MED ORDER — ACETAMINOPHEN 500 MG PO TABS
1000.0000 mg | ORAL_TABLET | Freq: Once | ORAL | Status: AC
  Administered 2013-02-16: 1000 mg via ORAL
  Filled 2013-02-16: qty 2

## 2013-02-16 MED ORDER — ACETAMINOPHEN 500 MG PO TABS
1000.0000 mg | ORAL_TABLET | Freq: Once | ORAL | Status: DC
  Administered 2013-02-16: 1000 mg via ORAL
  Filled 2013-02-16: qty 2

## 2013-02-16 MED ORDER — LORAZEPAM 1 MG PO TABS: 1.0000 mg | ORAL_TABLET | Freq: Once | ORAL | Status: DC

## 2013-02-16 MED ORDER — CLONAZEPAM 0.5 MG PO TABS
0.2500 mg | ORAL_TABLET | Freq: Once | ORAL | Status: AC
  Administered 2013-02-16: 0.25 mg via ORAL

## 2013-02-16 NOTE — ED Notes (Signed)
Pt requested soda. Given drink at this time.
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
## 2013-02-16 NOTE — ED Notes (Signed)
While in room, pt states she is not at risk for hurting herself or suicide. I, personally, d/c pt earlier today with Rx Ativan and explained to pt she could not take another Ativan until 7:26pm. Pt states she went home and took 6. This did not make her feel better so she got mad at herself and cut her arm with a razor.
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
## 2013-02-16 NOTE — ED Notes (Signed)
Pt states she did not eat her dinner because it looked nasty. Offered a sand which. Pt declined.

## 2013-02-16 NOTE — ED Provider Notes (Addendum)
Patient stable at this time. She is currently in pod C. awaiting reassessment by Telepsych.    11:01 PM Update on status: Noland Fordyce and I have discussed the patient's status.  She was attempting to leave the facility and we both feel this is unacceptable since the patient was discharged today return to the ED after cutting her wrists. She is deemed a risk to herself and IVC paperwork has been completed by myself.  Darlys Gales, MD 02/16/13 1610  Darlys Gales, MD 02/16/13 [phone]

## 2013-02-16 NOTE — ED Notes (Signed)
Pt requested to speak to medical, M.G. Notified at this time.
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
## 2013-02-16 NOTE — ED Notes (Signed)
Pt requested sleep med. MD notified.

## 2013-02-16 NOTE — ED Notes (Signed)
M.G. At bedside to evaluate patient per pt request. Pt stating she feels like she is going to "loose it" and "last time I was here I ripped the box off the wall and I feel like I am going to get there."

## 2013-02-16 NOTE — ED Notes (Signed)
Wanded by security. Pt cleared.

## 2013-02-16 NOTE — ED Notes (Signed)
Pt has small laceration to left wrist from razor. Is superficial, minimal bleeding. Laceration is horizontal. Is wrapped in kerlex.

## 2013-02-16 NOTE — ED Notes (Signed)
AC made aware of need for sitter 

## 2013-02-16 NOTE — ED Notes (Signed)
Pt reporting anxiety still present. PA made aware.

## 2013-02-16 NOTE — ED Notes (Signed)
Pt is waiting for discharge. Pt is adamantly denying any SI or HI. Reports she has a safe place to go when leaving the ER and she has a reliable plan to move away from [place]>.

## 2013-02-16 NOTE — ED Notes (Signed)
Security call to wand pt.

## 2013-02-16 NOTE — ED Notes (Signed)
Pt states "I do not understand why I am here, I could do all of this at home. I have so much to do. This is taking forever. Since yall have my stuff I will have to wear these scrubs out if I decide to leave right? I want to leave now." M.D. Notified at this time, recommends IVC due to two hour window of readmission from earlier involving self harm and patient abuse of prescription. [facility] notified.

## 2013-02-16 NOTE — ED Notes (Signed)
Pt requested a soda, given caffeine free soda at this time.
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
## 2013-02-16 NOTE — ED Notes (Signed)
Telepsych being completed.  

## 2013-02-16 NOTE — ED Provider Notes (Signed)
Medical screening examination/treatment/procedure(s) were performed by non-physician practitioner and as supervising physician I was immediately available for consultation/collaboration.   Shaqueena Mauceri S Bonifacio Pruden, MD 02/16/13 1445 

## 2013-02-17 ENCOUNTER — Inpatient Hospital Stay (HOSPITAL_COMMUNITY): Payer: No Typology Code available for payment source

## 2013-02-17 ENCOUNTER — Encounter (HOSPITAL_COMMUNITY): Payer: Self-pay

## 2013-02-17 ENCOUNTER — Inpatient Hospital Stay (HOSPITAL_COMMUNITY)
Admission: AD | Admit: 2013-02-17 | Discharge: 2013-02-28 | DRG: 885 | Disposition: A | Discharge status: Home / Self Care | Payer: No Typology Code available for payment source | Source: Intra-hospital | Attending: Psychiatry | Admitting: Psychiatry

## 2013-02-17 DIAGNOSIS — F909 Attention-deficit hyperactivity disorder, unspecified type: Secondary | ICD-10-CM | POA: Diagnosis present

## 2013-02-17 DIAGNOSIS — R519 Headache, unspecified: Secondary | ICD-10-CM

## 2013-02-17 DIAGNOSIS — F332 Major depressive disorder, recurrent severe without psychotic features: Principal | ICD-10-CM | POA: Diagnosis present

## 2013-02-17 DIAGNOSIS — F329 Major depressive disorder, single episode, unspecified: Secondary | ICD-10-CM | POA: Diagnosis present

## 2013-02-17 DIAGNOSIS — F4310 Post-traumatic stress disorder, unspecified: Secondary | ICD-10-CM | POA: Diagnosis present

## 2013-02-17 DIAGNOSIS — F988 Other specified behavioral and emotional disorders with onset usually occurring in childhood and adolescence: Secondary | ICD-10-CM

## 2013-02-17 DIAGNOSIS — F603 Borderline personality disorder: Secondary | ICD-10-CM | POA: Diagnosis present

## 2013-02-17 DIAGNOSIS — F410 Panic disorder [episodic paroxysmal anxiety] without agoraphobia: Secondary | ICD-10-CM | POA: Diagnosis present

## 2013-02-17 DIAGNOSIS — Z79899 Other long term (current) drug therapy: Secondary | ICD-10-CM

## 2013-02-17 DIAGNOSIS — F419 Anxiety disorder, unspecified: Secondary | ICD-10-CM

## 2013-02-17 DIAGNOSIS — R45851 Suicidal ideations: Secondary | ICD-10-CM

## 2013-02-17 LAB — RAPID URINE DRUG SCREEN, HOSP PERFORMED
Amphetamines: NOT DETECTED
Benzodiazepines: POSITIVE — AB
Cocaine: NOT DETECTED
Opiates: NOT DETECTED
Tetrahydrocannabinol: NOT DETECTED

## 2013-02-17 MED ORDER — MAGNESIUM HYDROXIDE 400 MG/5ML PO SUSP: 30.0000 mL | Freq: Every day | ORAL | Status: DC | PRN

## 2013-02-17 MED ORDER — ALUM & MAG HYDROXIDE-SIMETH 200-200-20 MG/5ML PO SUSP: 30.0000 mL | ORAL | Status: DC | PRN

## 2013-02-17 MED ORDER — ONDANSETRON HCL 4 MG PO TABS
4.0000 mg | ORAL_TABLET | Freq: Three times a day (TID) | ORAL | Status: DC | PRN
  Administered 2013-02-17: 4 mg via ORAL
  Filled 2013-02-17: qty 1

## 2013-02-17 MED ORDER — PAROXETINE HCL 20 MG PO TABS
20.0000 mg | ORAL_TABLET | Freq: Every day | ORAL | Status: DC
  Administered 2013-02-17 – 2013-02-20 (×4): 20 mg via ORAL
  Filled 2013-02-17: qty 1
  Filled 2013-02-17: qty 2
  Filled 2013-02-17 (×2): qty 1
  Filled 2013-02-17: qty 2
  Filled 2013-02-17 (×2): qty 1

## 2013-02-17 MED ORDER — CLONAZEPAM 1 MG PO TABS
1.0000 mg | ORAL_TABLET | Freq: Two times a day (BID) | ORAL | Status: DC
  Administered 2013-02-17 – 2013-02-28 (×22): 1 mg via ORAL
  Filled 2013-02-17 (×23): qty 1

## 2013-02-17 MED ORDER — SUMATRIPTAN SUCCINATE 50 MG PO TABS
50.0000 mg | ORAL_TABLET | Freq: Once | ORAL | Status: AC
  Administered 2013-02-17: 50 mg via ORAL
  Filled 2013-02-17: qty 1

## 2013-02-17 MED ORDER — ACETAMINOPHEN 325 MG PO TABS
650.0000 mg | ORAL_TABLET | Freq: Four times a day (QID) | ORAL | Status: DC | PRN
  Administered 2013-02-17 – 2013-02-26 (×5): 650 mg via ORAL
  Filled 2013-02-17 (×7): qty 2

## 2013-02-17 MED ORDER — TRAZODONE HCL 50 MG PO TABS
50.0000 mg | ORAL_TABLET | Freq: Every evening | ORAL | Status: DC | PRN
  Administered 2013-02-18 – 2013-02-19 (×4): 50 mg via ORAL
  Filled 2013-02-17: qty 1
  Filled 2013-02-17: qty 14
  Filled 2013-02-17 (×3): qty 1

## 2013-02-17 MED ORDER — BUSPIRONE HCL 10 MG PO TABS: 10.0000 mg | ORAL_TABLET | Freq: Three times a day (TID) | ORAL | Status: DC

## 2013-02-17 MED ORDER — ACETAMINOPHEN 500 MG PO TABS
1000.0000 mg | ORAL_TABLET | Freq: Once | ORAL | Status: DC
  Filled 2013-02-17 (×2): qty 2

## 2013-02-17 MED ORDER — CLONAZEPAM 1 MG PO TABS: 1.0000 mg | ORAL_TABLET | Freq: Two times a day (BID) | ORAL | Status: DC
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
## 2013-02-17 NOTE — ED Notes (Signed)
Pt transported at this time by [facility] to [facility], all pt belongings given to transport. Pt crying and stating "Where is my recommendation, why did I not get to choose where I go." Pt reassured.

## 2013-02-17 NOTE — Progress Notes (Signed)
Recreation Therapy Notes  Date: 10.06.2014 Time: 3:00pm Location: [address] Dayroom  Group Topic: Wellness  Goal Area(s) Addresses:  Patient will verbalize benefit of whole wellness. Patient will identify at least two ways they are investing in each defined dimension of whole wellness.  Behavioral Response: Engaged, Attentive, Appropriate  Intervention: Air traffic controller  Activity: Wellness Flower. Patients were provided a worksheet with six dimensions of whole wellness - Wellness, Relationships, Physical Environment, Fun & Creativity, Personal Development, & Future. Patients were asked to identify two ways they are personally investing in each dimension.   Education: Discharge Planning.    Education Outcome: Acknowledges understanding   Clinical Observations/Feedback: Patient attended group session, completing worksheet as requested. Patient did not share items from her worksheet and made no contributions to group discussion, but appeared to actively listen as she maintained appropriate eye contact with speaker.   Marykay Lex Ayuub Penley, LRT/CTRS  Maedell Hedger L 02/17/2013 4:01 PM
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
## 2013-02-17 NOTE — ED Notes (Signed)
Pt called to desk to ask to speak to nurse. Informed nurse that she still is awake and she states "if that doctor will not give me anything else I am going to go crazy. I am not scared to go to jail."

## 2013-02-17 NOTE — [facility] Group Notes (Signed)
[facility] LCSW Aftercare Discharge Planning Group Note   02/17/2013 10:10 AM    Participation Quality:  Appropraite  Mood/Affect: Depressed, Tearful  Depression Rating:  8  Anxiety Rating:  10  Thoughts of Suicide:  No  Will you contract for safety?   NA  Current AVH:  No  Plan for Discharge/Comments:  Patient attending discharge planning group and actively participated in group.  She reports plans to relocate to [place] upon discharge.  CSW provided all participants with daily workbook.   Transportation Means: Patient has transportation.   Supports:  Patient has a support system.   Tarance Balan, Joesph July
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
## 2013-02-17 NOTE — ED Provider Notes (Signed)
I saw and evaluated the patient, reviewed the resident's note and I agree with the findings and plan.  Anxiety.  Pt presents as an ER return approximate 4 hours after discharge. She cut herself and took Ativan. Plan for psych evaluation and placement.  Darlys Gales, MD 02/17/13 [id number]
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
## 2013-02-17 NOTE — ED Notes (Signed)
Pt called to nurse to tell her she is still awake.

## 2013-02-17 NOTE — [facility] Group Notes (Signed)
[facility] LCSW Group Therapy          Overcoming Obstacles       1:15 -2:30        02/17/2013  3:31 PM  Type of Therapy:  Group Therapy  Participation Level:  Appropriate  Participation Quality:  Appropriate  Affect:  Appropriate, Alert  Cognitive:  Attentive Appropriate  Insight: Developing/Improving Engaged  Engagement in Therapy: Developing/Imprvoing Engaged  Modes of Intervention:  Discussion Exploration  Education Rapport BuildingProblem-Solving Support  Summary of Progress/Problems:  The main focus of today's group was overcoming  Obstacles.  She shared the obstacle she needs to overcome is the fear of change.  Patient shared she has become comfortable with the way things are and knows they are not good but fears finding out what else is out there for her.   Wynn Banker 02/17/2013    3:31 PM
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
## 2013-02-17 NOTE — ED Notes (Signed)
Pt called to nurse to inform staff that she is still awake. MD notified.

## 2013-02-17 NOTE — ED Notes (Signed)
Pt informed she cannot have any more sleeping medication, for sixth time.

## 2013-02-17 NOTE — ED Notes (Signed)
PT called to nurse to ask for hot chocolate, brought drink.

## 2013-02-18 DIAGNOSIS — F410 Panic disorder [episodic paroxysmal anxiety] without agoraphobia: Secondary | ICD-10-CM

## 2013-02-18 DIAGNOSIS — F200 Paranoid schizophrenia: Secondary | ICD-10-CM

## 2013-02-18 MED ORDER — BENZTROPINE MESYLATE 1 MG/ML IJ SOLN
1.0000 mg | Freq: Once | INTRAMUSCULAR | Status: AC
  Administered 2013-02-18: 1 mg via INTRAVENOUS
  Filled 2013-02-18: qty 2

## 2013-02-18 MED ORDER — KETOROLAC TROMETHAMINE 30 MG/ML IJ SOLN
30.0000 mg | Freq: Once | INTRAMUSCULAR | Status: AC
  Administered 2013-02-18: 30 mg via INTRAVENOUS
  Filled 2013-02-18: qty 1

## 2013-02-18 MED ORDER — DIPHENHYDRAMINE HCL 50 MG/ML IJ SOLN
25.0000 mg | Freq: Once | INTRAMUSCULAR | Status: AC
  Administered 2013-02-18: 25 mg via INTRAVENOUS
  Filled 2013-02-18: qty 1

## 2013-02-18 MED ORDER — PROMETHAZINE HCL 25 MG/ML IJ SOLN
25.0000 mg | Freq: Once | INTRAMUSCULAR | Status: AC
  Administered 2013-02-18: 25 mg via INTRAMUSCULAR
  Filled 2013-02-18: qty 1

## 2013-02-18 MED ORDER — DEXAMETHASONE SODIUM PHOSPHATE 10 MG/ML IJ SOLN
10.0000 mg | Freq: Once | INTRAMUSCULAR | Status: AC
  Administered 2013-02-18: 10 mg via INTRAVENOUS
  Filled 2013-02-18: qty 1

## 2013-02-18 MED ORDER — SODIUM CHLORIDE 0.9 % IV BOLUS (SEPSIS)
1000.0000 mL | Freq: Once | INTRAVENOUS | Status: AC
  Administered 2013-02-18: 1000 mL via INTRAVENOUS

## 2013-02-18 MED ORDER — METOCLOPRAMIDE HCL 5 MG/ML IJ SOLN
10.0000 mg | Freq: Once | INTRAMUSCULAR | Status: AC
  Administered 2013-02-18: 10 mg via INTRAVENOUS
  Filled 2013-02-18: qty 2

## 2013-02-18 MED ORDER — AMPHETAMINE-DEXTROAMPHETAMINE 10 MG PO TABS
10.0000 mg | ORAL_TABLET | Freq: Every day | ORAL | Status: DC
  Administered 2013-02-19: 10 mg via ORAL
  Filled 2013-02-18: qty 1

## 2013-02-18 MED ORDER — AMPHETAMINE-DEXTROAMPHETAMINE 10 MG PO TABS
10.0000 mg | ORAL_TABLET | Freq: Once | ORAL | Status: AC
  Administered 2013-02-18: 10 mg via ORAL
  Filled 2013-02-18: qty 1

## 2013-02-18 MED ORDER — LORAZEPAM 2 MG/ML IJ SOLN
2.0000 mg | Freq: Once | INTRAMUSCULAR | Status: AC
  Administered 2013-02-18: 2 mg via INTRAVENOUS
  Filled 2013-02-18: qty 1
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
## 2013-02-18 NOTE — Progress Notes (Signed)
Recreation Therapy Notes  Date: 10.07.2014 Time: 2:30pm Location: 300 Hall Dayroom  Group Topic: Animal Assisted Activities (AAA)  Behavioral Response: Engaged, Appropriate  Affect: Euthymic  Clinical Observations/Feedback: Dog Team: Heidi Pearson & handler. Patient interacted appropriately with peer, dog team, LRT and MHT.   Shepard Keltz L Rock Sobol, LRT/CTRS  Latania Bascomb L 02/18/2013 4:25 PM 

## 2013-02-18 NOTE — Progress Notes (Addendum)
D:  Patient's self inventory sheet, patient has poor sleep, poor appetite, low energy level, poor attention span.  Rated depression and hopeless #7, anxiety 10.  Denied withdrawals.  Denied SI.  Went to ER last night for headache, felt drowsy and tired this morning.  Does have discharge plans.  No problems taking meds after discharge. A:  Medications administered per MD orders.  Emotional support and encouragement given patient. R:  Denied SI and HI.  Denied A/V hallucinations.  Will continue to monitor patient with 15 minute checks.  Safety maintained.  Patient continues to ask for adderall today.  Adderall has been scheduled to being Wednesday at 0800.   Patient was drowsy this morning, has been up and going to groups today.  Patient stated she has always taken adderall, does not understand why she cannot have her medication when she wants medication.  Patient continues to medication seek.  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   Have tried to calm patient, reassure patient that medication orders are being processed and will give patient medication when ordered.    1450  Patient continues to ask about medications.  Adderall scheduled at 1430 was given.  Patient asked about klonipin.  Then patient asked about tylenol for lingering headache #3.  Tylenol given, patient walked away "thank you".

## 2013-02-18 NOTE — Progress Notes (Signed)
Adult Psychoeducational Group Note  Date:  02/18/2013 Time:  10:14 PM  Group Topic/Focus:  Goals Group:   The focus of this group is to help patients establish daily goals to achieve during treatment and discuss how the patient can incorporate goal setting into their daily lives to aide in recovery.  Participation Level:  Active  Participation Quality:  Appropriate  Affect:  Appropriate  Cognitive:  Appropriate  Insight: Appropriate  Engagement in Group:  Engaged  Modes of Intervention:  Discussion  Additional Comments:  Pt stated that she made new friends so that made her day today.  Terie Purser R 02/18/2013, 10:14 PM

## 2013-02-18 NOTE — Progress Notes (Signed)
Adult Psychoeducational Group Note  Date:  02/18/2013 Time:  11:00am Group Topic/Focus:  Recovery Goals:   The focus of this group is to identify appropriate goals for recovery and establish a plan to achieve them.  Participation Level:  Active  Participation Quality:  Appropriate and Attentive  Affect:  Appropriate  Cognitive:  Alert and Appropriate  Insight: Appropriate  Engagement in Group:  Engaged  Modes of Intervention:  Discussion and Education  Additional Comments:  Pt attended and participated in group. Discussion was on recovery and pt stated recovery means to her trying to fix something that wasn't right.  Shelly Bombard D 02/18/2013, 1:35 PM

## 2013-02-18 NOTE — Progress Notes (Signed)
The focus of this group is to educate the patient on the purpose and policies of crisis stabilization and provide a format to answer questions about their admission.  The group details unit policies and expectations of patients while admitted.  Patient attended part of the  0900 nurse education orientation group.  Patient listened, appropriate affect, alert, appropriate insight and engagement.  Patient will work on 3 coping skills to help her after discharge.

## 2013-02-18 NOTE — [facility] Group Notes (Signed)
[facility] LCSW Group Therapy      Feelings About Diagnosis 1:15 - 2:30 PM         02/18/2013  2:35 PM    Type of Therapy:  Group Therapy  Participation Level:  Active  Participation Quality:  Appropriate  Affect:  Appropriate  Cognitive:  Alert and Appropriate  Insight:  Developing/Improving and Engaged  Engagement in Therapy:  Developing/Improving and Engaged  Modes of Intervention:  Discussion, Education, Exploration, Problem-Solving, Rapport Building, Support  Summary of Progress/Problems:  Patient actively participated in group. Patient discussed past and present diagnosis and the effects it has had on  life.  Patient talked about family and society being judgmental and the stigma associated with having a mental health diagnosis. 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  Patient advised she has been in recovery for a year and a half.  She was encouraged to celebrate her recovery and the fact that for a year and a half she has been in controlling of something that had controlled her.  Wynn Banker 02/18/2013  2:35 PM
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
## 2013-02-19 MED ORDER — AMPHETAMINE-DEXTROAMPHETAMINE 10 MG PO TABS
10.0000 mg | ORAL_TABLET | Freq: Two times a day (BID) | ORAL | Status: DC
  Administered 2013-02-19 – 2013-02-20 (×2): 10 mg via ORAL
  Filled 2013-02-19 (×2): qty 1

## 2013-02-19 NOTE — [facility] Group Notes (Signed)
[facility] LCSW Group Therapy  Emotional Regulation 1:15 - 2: 30 PM        02/19/2013   Type of Therapy:  Group Therapy  Participation Level:  Appropriate  Participation Quality:  Appropriate  Affect:  Appropriate  Cognitive:  Attentive Appropriate  Insight:  Engaged  Engagement in Therapy:  Engaged  Modes of Intervention:  Discussion Exploration Problem-Solving Supportive  Summary of Progress/Problems:  Group topic was emotional regulations.  Patient participated in the discussion and was able to identify an emotion that needed to regulated.  Patient shared she deals with a lot of grief and anger over her past (addiction).  Patient was able to identify approprite coping skills.  Wynn Banker 02/19/2013

## 2013-02-19 NOTE — Progress Notes (Signed)
D:  Per pt self inventory pt reports sleeping poor, appetite improving, energy level low, ability to pay attention poor, rates depression at a 7 out of 10 and hopelessness at a 7 out of 10, denies HI/AVH, Passive SI on and off contracts for safety.  A:  Emotional support provided, Encouraged pt to continue with treatment plan and attend all group activities, q15 min checks maintained for safety.  R:  Pt is going to groups, receptive to treatment plan, pt is calm and cooperative with staff and other patients, pt plans to stay on her meds when she returns home as a way to better take care of herself.

## 2013-02-19 NOTE — Progress Notes (Signed)
Adult Psychoeducational Group Note  Date:  02/19/2013 Time:  11:00am Group Topic/Focus:  Personal Choices and Values:   The focus of this group is to help patients assess and explore the importance of values in their lives, how their values affect their decisions, how they express their values and what opposes their expression.  Participation Level:  Active  Participation Quality:  Appropriate and Attentive  Affect:  Appropriate  Cognitive:  Alert and Appropriate  Insight: Appropriate  Engagement in Group:  Engaged  Modes of Intervention:  Discussion and Education  Additional Comments:  Pt attended and participated in group. Discussion in group was about Personal development. Pt was ask what crisis were they in and what triggers were going on in their life? Pt stated her crisis is not taking her medications. Her trigger is her having to live with her mother.   Shelly Bombard D 02/19/2013, 1:38 PM

## 2013-02-19 NOTE — [facility] Group Notes (Signed)
[facility] LCSW Aftercare Discharge Planning Group Note   02/19/2013 10:23 AM    Participation Quality:  Appropraite  Mood/Affect:  Appropriate  Depression Rating:  4  Anxiety Rating:  7  Thoughts of Suicide:  No  Will you contract for safety?   NA  Current AVH:  No  Plan for Discharge/Comments:  Patient attending discharge planning group and actively participated in group.  She reports plans to relocate to [place] to live with her father and is asking for assistance in arranging outpatient follow up.  Transportation Means: Patient has transportation.   Supports:  Patient has a support system.   Juandaniel Manfredo, Joesph July

## 2013-02-19 NOTE — Progress Notes (Signed)
Recreation Therapy Notes  Date: 10.08.2014 Time: 3:00pm  Location: [address] Dayroom  Group Topic: Primary: Safety, Secondary: Communication, Teamwork.  Goal Area(s) Addresses:  Patient will successfully work independently to determine survival needs. Patient with successfully work with peers to determine survival needs.   Patient will relate activity to personal safety.   Behavioral Response: Engaged, Appropriate   Intervention: Survival Scenario  Activity: Lost at [employer]. Patients were read a scenario about being lost at sea. As a group patients were asked to rank of 15 items provided for survival.   Education:  Administrator, arts, Communication, Team Work, Building control surveyor.  Education Outcome: Acknowledges understanding  Clinical Observations/Feedback: Patient actively participated in activity, voicing her opinion and debating with peers appropriately. Patient successfully identify problem solving and team work as skills needed during this activity. Patient related team work to her support system post d/c and spoke about the importance of relying on others when necessary.  Marykay Lex Aldrich Lloyd, LRT/CTRS  Keatyn Jawad L 02/19/2013 4:07 PM
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
## 2013-02-19 NOTE — Progress Notes (Signed)
Adult Psychoeducational Group Note  Date:  02/19/2013 Time:  10:00 PM  Group Topic/Focus:  Wrap-Up Group:   The focus of this group is to help patients review their daily goal of treatment and discuss progress on daily workbooks.  Participation Level:  Active  Participation Quality:  Appropriate  Affect:  Appropriate  Cognitive:  Appropriate  Insight: Appropriate  Engagement in Group:  Engaged and Supportive  Modes of Intervention:  Socialization and Support  Additional Comments:  Pt stated that one good thing was that she was able to keep her roommate out and active and that was a good feeling. Pt is very supportive to her roommate.   Nickola Lenig 02/19/2013, 10:00 PM

## 2013-02-20 MED ORDER — CLONAZEPAM 1 MG PO TABS: 1.0000 mg | ORAL_TABLET | Freq: Two times a day (BID) | ORAL | Status: DC

## 2013-02-20 MED ORDER — AMPHETAMINE-DEXTROAMPHETAMINE 10 MG PO TABS
10.0000 mg | ORAL_TABLET | Freq: Two times a day (BID) | ORAL | Status: DC
  Administered 2013-02-20 – 2013-02-28 (×17): 10 mg via ORAL
  Filled 2013-02-20 (×17): qty 1

## 2013-02-20 MED ORDER — PAROXETINE HCL 20 MG PO TABS: 20.0000 mg | ORAL_TABLET | Freq: Every day | ORAL | Status: DC

## 2013-02-20 MED ORDER — PAROXETINE HCL 30 MG PO TABS
30.0000 mg | ORAL_TABLET | Freq: Every day | ORAL | Status: DC
  Administered 2013-02-21 – 2013-02-27 (×7): 30 mg via ORAL
  Filled 2013-02-20 (×8): qty 1

## 2013-02-20 MED ORDER — TRAZODONE HCL 50 MG PO TABS: ORAL_TABLET | ORAL | Status: DC

## 2013-02-20 MED ORDER — TRAZODONE HCL 100 MG PO TABS
100.0000 mg | ORAL_TABLET | Freq: Every evening | ORAL | Status: AC | PRN
  Administered 2013-02-20 – 2013-02-23 (×8): 100 mg via ORAL
  Filled 2013-02-20 (×8): qty 1

## 2013-02-20 NOTE — Progress Notes (Signed)
Adult Psychoeducational Group Note  Date:  02/20/2013 Time:  11:00am Group Topic/Focus:  Building Self Esteem:   The Focus of this group is helping patients become aware of the effects of self-esteem on their lives, the things they and others do that enhance or undermine their self-esteem, seeing the relationship between their level of self-esteem and the choices they make and learning ways to enhance self-esteem.  Participation Level:  Active  Participation Quality:  Appropriate and Attentive  Affect:  Appropriate  Cognitive:  Alert and Appropriate  Insight: Appropriate  Engagement in Group:  Engaged  Modes of Intervention:  Discussion and Education  Additional Comments:  Pt attended and participated in group. Discussion was about lifestyle changes. When ask  What she would change would be her surroundings making smarter choices.  Shelly Bombard D 02/20/2013, 2:42 PM

## 2013-02-20 NOTE — Discharge Summary (Signed)
Physician Discharge Summary Note  Patient:  Heidi Pearson is an 37 y.o., female MRN:  [id number] DOB:  Apr 10, 1976 Patient phone:  [phone] (home)  Patient address:   [address]  [place] [address],   Date of Admission:  02/17/2013 Date of Discharge: 02/20/2013   Reason for Admission:  Overdose with suicide attempt  Discharge Diagnoses: Active Problems:   Panic attacks   PTSD (post-traumatic stress disorder)   MDD (major depressive disorder)   ADHD (attention deficit hyperactivity disorder)  ROS  DSM5: DSM5:  Schizophrenia Disorders:  Obsessive-Compulsive Disorders:  Trauma-Stressor Disorders: Posttraumatic Stress Disorder (309.81)  Substance/Addictive Disorders:  Depressive Disorders: Major Depressive Disorder - Severe (296.23)  AXIS I: ADHD, inattentive type, Major Depression, Recurrent severe and Post Traumatic Stress Disorder  AXIS II: Deferred  AXIS III:  Past Medical History   Diagnosis  Date   .  Anxiety    .  Chronic knee pain    .  Depression    .  Anxiety     AXIS IV: other psychosocial or environmental problems, problems related to social environment and problems with primary support group  AXIS V: 41-50 serious symptoms  Level of Care:  OP  Hospital Course:              Heidi Pearson is an 37 y.o. single like female admitted involuntarily and emergently from [facility] for self-injurious behavior and overdosed on her medication-Ativan times 6 tablets.        Heidi Pearson admitted to the [facility] unit. She was evaluated and her symptoms were identified. She was oriented to the unit and encouraged to participate in unit programming. Medical problems were identified and treated appropriately. Home medication was restarted as needed. Psychiatric medication management was initiated.       Heidi Pearson was evaluated each day by a clinical provider to ascertain the patient's response to treatment.  Improvement was noted by the patient's report of decreasing symptoms,  improved sleep and appetite, affect, medication tolerance, behavior, and participation in unit programming.  Heidi Pearson was asked each day to complete a self inventory noting mood, mental status, pain, new symptoms, anxiety and concerns.         She responded well to medication and being in a therapeutic and supportive environment. Positive and appropriate behavior was noted and the patient was motivated for recovery.  Heidi Pearson worked closely with the treatment team and case manager to develop a discharge plan with appropriate goals. Coping skills, problem solving as well as relaxation therapies were also part of the unit programming.         By the day of discharge the patient was in much improved condition than upon admission.  Symptoms were reported as significantly decreased or resolved completely.  The patient denied SI/HI and voiced no AVH. She was motivated to continue taking medication with a goal of continued improvement in mental health.          Heidi Pearson was discharged home with a plan to follow up as noted below. Consults:  None  Significant Diagnostic Studies:  labs: CBC, CMP, UA, UDS,UPT  Discharge Vitals:   Blood pressure 127/86, pulse 99, temperature 97.9 F (36.6 C), temperature source Oral, resp. rate 20, height 5\' 3"  (1.6 m), weight 94.348 kg (208 lb), last menstrual period 02/09/2013, SpO2 97.00%. Body mass index is 36.85 kg/(m^2). Lab Results:   No results found for this or any previous visit (from the past 72 hour(s)).  Physical Findings: AIMS: Facial and Oral  Movements Muscles of Facial Expression: None, normal Lips and Perioral Area: None, normal Jaw: None, normal Tongue: None, normal,Extremity Movements Upper (arms, wrists, hands, fingers): None, normal Lower (legs, knees, ankles, toes): None, normal, Trunk Movements Neck, shoulders, hips: None, normal, Overall Severity Severity of abnormal movements (highest score from questions above): None, normal Incapacitation  due to abnormal movements: None, normal Patient's awareness of abnormal movements (rate only patient's report): No Awareness, Dental Status Current problems with teeth and/or dentures?: No Does patient usually wear dentures?: No  CIWA:  CIWA-Ar Total: 1 COWS:  COWS Total Score: 1  Psychiatric Specialty Exam: See Psychiatric Specialty Exam and Suicide Risk Assessment completed by Attending Physician prior to discharge.  Discharge destination:  Home  Is patient on multiple antipsychotic therapies at discharge:  No   Has Patient had three or more failed trials of antipsychotic monotherapy by history:  No  Recommended Plan for Multiple Antipsychotic Therapies: NA  Discharge Orders   Future Orders Complete By Expires   Diet - low sodium heart healthy  As directed    Discharge instructions  As directed    Comments:     Take all of your medications as directed. Be sure to keep all of your follow up appointments.  If you are unable to keep your follow up appointment, call your Doctor's office to let them know, and reschedule.  Make sure that you have enough medication to last until your appointment. Be sure to get plenty of rest. Going to bed at the same time each night will help. Try to avoid sleeping during the day.  Increase your activity as tolerated. Regular exercise will help you to sleep better and improve your mental health. Eating a heart healthy diet is recommended. Try to avoid salty or fried foods. Be sure to avoid all alcohol and illegal drugs.   Increase activity slowly  As directed        Medication List       Indication   clonazePAM 1 MG tablet  Commonly known as:  KLONOPIN  Take 1 tablet (1 mg total) by mouth 2 (two) times daily. For anxiety and panic   Indication:  Panic Disorder     PARoxetine 20 MG tablet  Commonly known as:  PAXIL  Take 1 tablet (20 mg total) by mouth daily. For depression and anxiety.   Indication:  Major Depressive Disorder     traZODone 50  MG tablet  Commonly known as:  DESYREL  Take one tablet at bedtime if needed for insomnia.   Indication:  Trouble Sleeping       Follow-up recommendations:   Activities: Resume activity as tolerated. Diet: Heart healthy low sodium diet Tests: Follow up testing will be determined by your out patient provider.  Comments:    Total Discharge Time:  Greater than 30 minutes.  Signed: Rona Ravens. [place] RPAC 10:24 AM 02/20/2013  Reviewed the information documented and agree with the treatment plan.  Shelina Luo,JANARDHAHA R. 02/21/2013 11:44 PM

## 2013-02-20 NOTE — Progress Notes (Signed)
[facility] Adult Case Management Discharge Plan :  Will you be returning to the same living situation after discharge: No.  Patient to live with aunt for a few days until she relocates to [place], [place] At discharge, do you have transportation home?:Yes,  Patient to arrange transportation Do you have the ability to pay for your medications:No.  Patient assisted with indigent medications.  Release of information consent forms completed and in the chart;  Patient's signature needed at discharge.  Patient to Follow up at: Follow-up Information   Follow up with Cleta Alberts - [facility] On 03/26/2013. (Wednesday, March 26, 2013 at 1:15 PM)    Contact information:   [address] [place], [place]  [address]  [phone]      Patient denies SI/HI:   Patient no longer endorsing SI/HI or other thoughts of self harm.     Safety Planning and Suicide Prevention discussed:  .Reviewed with all patients during discharge planning group   Penney Domanski, Joesph July 02/20/2013, 12:25 PM

## 2013-02-20 NOTE — Progress Notes (Signed)
D: Patient appropriate and cooperative with staff and peers. Patient's affect/mood is depressed. Sad at times, brightens on approach. She reported on the self inventory sheet that her sleep is poor, appetite is good, energy level is low and ability to pay attention is improving. Patient rated depression "4" and feelings of hopelessness "6". She's attending groups and interactive with peers in the milieu. Compliant with medications.  A: Support and encouragement provided to patient. Administered scheduled medications per ordering MD. Monitor Q15 minute checks for safety.  R: Patient receptive. Denies SI/HI. Patient remains safe on the unit.

## 2013-02-20 NOTE — [facility] Group Notes (Signed)
[facility] LCSW Group Therapy  02/20/2013  1:15 PM   Type of Therapy:  Group Therapy  Participation Level:  Active  Participation Quality:  Appropriate and Attentive  Affect:  Appropriate, Flat and Depressed.    Cognitive:  Alert and Appropriate  Insight:  Developing/Improving and Engaged  Engagement in Therapy:  Developing/Improving and Engaged  Modes of Intervention:  Activity, Clarification, Confrontation, Discussion, Education, Exploration, Limit-setting, Orientation, Problem-solving, Rapport Building, Reality Testing, Socialization and Support  Summary of Progress/Problems: Patient was attentive and engaged with speaker from [employer].  Patient was attentive to speaker while they shared their story of dealing with mental health and overcoming it.  Patient expressed interest in their programs and services and received information on their agency.  Patient processed ways they can relate to the speaker.     Heidi Pearson, LCSWA 02/20/2013 1:32 PM   

## 2013-02-20 NOTE — [facility] Group Notes (Signed)
The focus of this group is to educate the patient on the purpose and policies of crisis stabilization and provide a format to answer questions about their admission.  The group details unit policies and expectations of patients while admitted.  Pt actively participated in group, attentive and quiet while others shared.

## 2013-02-20 NOTE — Progress Notes (Signed)
Patient ID: Heidi Pearson, female   DOB: August 27, 1975, 37 y.o.   MRN: [id number]   D: Pt informed the writer that she gets anxious waiting to speak to the dr. Also stated she was anxious because she was waiting on her clothes to wash. Pt stated that she spoke to the dr about changing the scheduled times for her klonopin, but was denied. Stated "I don't need it at 1700". Pt also stated she showed the dr her wrist. Asked the writer if "what she did last night could make her stay here longer". Writer explained that the dr's take everything into consideration. Informed Clinical research associate that if dr feels pt is not ready for discharge that even if her time runs out here, that they could refer her possibly to state hosp. Informed pt the main concern is to keep pt safe.   A:  Support and encouragement was offered. 15 min checks continued for safety.  R: Pt remains safe.

## 2013-02-20 NOTE — Progress Notes (Signed)
Recreation Therapy Notes  Date: 10.09.2014 Time: 2:45pm Location: [address] Dayroom  Group Topic: Software engineer Activities (AAA)  Behavioral Response: Engaged, Appropriate  Affect: Euthymic  Clinical Observations/Feedback: Dog Team: [employer] Patient interacted appropriately with peer, dog team, LRT and MHT.   Marykay Lex Jerimy Johanson, LRT/CTRS  Rupert Azzara L 02/20/2013 4:40 PM

## 2013-02-20 NOTE — [facility] Suicide Risk Assessment (Signed)
[facility] INPATIENT:  Family/Significant Other Suicide Prevention Education  Suicide Prevention Education: Late Entry for 02/19/13 Education Completed; Avereigh Spainhower, Father, [phone] has been identified by the patient as the family member/significant other with whom the patient will be residing, and identified as the person(s) who will aid the patient in the event of a mental health crisis (suicidal ideations/suicide attempt).  With written consent from the patient, the family member/significant other has been provided the following suicide prevention education, prior to the and/or following the discharge of the patient.  The suicide prevention education provided includes the following:  Suicide risk factors  Suicide prevention and interventions  National Suicide Hotline telephone number  [facility] assessment telephone number  [facility] Emergency Assistance 911  [facility] and/or Residential Mobile Crisis Unit telephone number  Request made of family/significant other to:  Remove weapons (e.g., guns, rifles, knives), all items previously/currently identified as safety concern.  Father advised patient does not have access to guns.  Remove drugs/medications (over-the-counter, prescriptions, illicit drugs), all items previously/currently identified as a safety concern.  The family member/significant other verbalizes understanding of the suicide prevention education information provided.  The family member/significant other agrees to remove the items of safety concern listed above.  Wynn Banker 02/20/2013, 12:16 PM

## 2013-02-20 NOTE — Progress Notes (Signed)
[facility] MD Progress Note  02/20/2013 11:18 AM Heidi Pearson  MRN:  [id number]  Subjective:  Patient continue to be depressed, anxious and suicidal. She has self injurious behavior on with her ear rings, than she told her staff RN. Patient has lingering kind of headache and not taking her PRN medication, saying it is not bad. Patient has been compliant with her medication and has no adverse affects. Patient has been actively participating in groups and learning coping skills. Patient rated her symptoms of depression 6/10 and anxiety 6/10 and continue to be suicidal can't contact for safety.   Diagnosis:   DSM5: Schizophrenia Disorders:   Obsessive-Compulsive Disorders:   Trauma-Stressor Disorders:   Substance/Addictive Disorders:   Depressive Disorders:    Axis I: ADHD, inattentive type, Chronic Paranoid Schizophrenia, Major Depression, Recurrent severe, Panic Disorder and Post Traumatic Stress Disorder  ADL's:  Impaired  Sleep: Fair  Appetite:  Fair  Suicidal Ideation:  Patient had a suicide attempt with overdosing on medication and contracts for safety in the hospital. Homicidal Ideation:  Denied AEB (as evidenced by):  Psychiatric Specialty Exam: ROS  Blood pressure 127/86, pulse 99, temperature 97.9 F (36.6 C), temperature source Oral, resp. rate 20, height 5\' 3"  (1.6 m), weight 94.348 kg (208 lb), last menstrual period 02/09/2013, SpO2 97.00%.Body mass index is 36.85 kg/(m^2).  General Appearance: Fairly Groomed  Patent attorney::  Fair  Speech:  Clear and Coherent  Volume:  Normal  Mood:  Anxious, Depressed, Hopeless and Worthless  Affect:  Depressed and Flat  Thought Process:  Goal Directed and Intact  Orientation:  Full (Time, Place, and Person)  Thought Content:  Rumination  Suicidal Thoughts:  Yes.  with intent/plan  Homicidal Thoughts:  No  Memory:  Immediate;   Fair  Judgement:  Impaired  Insight:  Lacking  Psychomotor Activity:  Psychomotor Retardation   Concentration:  Fair  Recall:  Fair  Akathisia:  NA  Handed:  Right  AIMS (if indicated):     Assets:  Communication Skills Desire for Improvement Physical Health Resilience Social Support  Sleep:  Number of Hours: 5.75   Current Medications: Current Facility-Administered Medications  Medication Dose Route Frequency Provider Last Rate Last Dose  . acetaminophen (TYLENOL) tablet 1,000 mg  1,000 mg Oral Once Court Joy, PA-C      . acetaminophen (TYLENOL) tablet 650 mg  650 mg Oral Q6H PRN Court Joy, PA-C   650 mg at 02/18/13 1449  . alum & mag hydroxide-simeth (MAALOX/MYLANTA) 200-200-20 MG/5ML suspension 30 mL  30 mL Oral Q4H PRN Court Joy, PA-C      . amphetamine-dextroamphetamine (ADDERALL) tablet 10 mg  10 mg Oral BID WC Sanjuana Kava, NP   10 mg at 02/20/13 0751  . clonazePAM (KLONOPIN) tablet 1 mg  1 mg Oral BID Nehemiah Settle, MD   1 mg at 02/20/13 0657  . magnesium hydroxide (MILK OF MAGNESIA) suspension 30 mL  30 mL Oral Daily PRN Court Joy, PA-C      . ondansetron [facility]) tablet 4 mg  4 mg Oral Q8H PRN Verne Spurr, PA-C   4 mg at 02/17/13 1633  . PARoxetine (PAXIL) tablet 20 mg  20 mg Oral Daily Nehemiah Settle, MD   20 mg at 02/20/13 0750  . traZODone (DESYREL) tablet 50 mg  50 mg Oral QHS PRN,MR X 1 Court Joy, PA-C   50 mg at 02/19/13 2320    Lab Results:  No results  found for this or any previous visit (from the past 48 hour(s)).  Physical Findings: AIMS: Facial and Oral Movements Muscles of Facial Expression: None, normal Lips and Perioral Area: None, normal Jaw: None, normal Tongue: None, normal,Extremity Movements Upper (arms, wrists, hands, fingers): None, normal Lower (legs, knees, ankles, toes): None, normal, Trunk Movements Neck, shoulders, hips: None, normal, Overall Severity Severity of abnormal movements (highest score from questions above): None, normal Incapacitation due to abnormal movements: None,  normal Patient's awareness of abnormal movements (rate only patient's report): No Awareness, Dental Status Current problems with teeth and/or dentures?: No Does patient usually wear dentures?: No  CIWA:  CIWA-Ar Total: 1 COWS:  COWS Total Score: 1  Treatment Plan Summary: Daily contact with patient to assess and evaluate symptoms and progress in treatment Medication management  Plan: Continue Adderall 10 mg daily morning and afternoon Increase Paxil 30 mg for depression Continue Klonopin for panic disorders Increase trazodone 100 mg for sleep without change. Treatment Plan/Recommendations:   1. Admit for crisis management and stabilization. 2. Medication management to reduce current symptoms to base line and improve the patient's overall level of functioning. 3. Treat health problems as indicated. 4. Develop treatment plan to decrease risk of relapse upon discharge and to reduce the need for readmission. 5. Psycho-social education regarding relapse prevention and self care. 6. Health care follow up as needed for medical problems. 7. Restart home medications where appropriate. 8. Patient may be discharge on Friday if she continues to contract for safety  Medical Decision Making Problem Points:  Established problem, worsening (2), New problem, with additional work-up planned (4), Review of last therapy session (1) and Review of psycho-social stressors (1) Data Points:  Review or order clinical lab tests (1) Review or order medicine tests (1) Review of medication regiment & side effects (2) Review of new medications or change in dosage (2)  I certify that inpatient services furnished can reasonably be expected to improve the patient's condition.   Nehemiah Settle., MD 02/20/2013, 11:18 AM

## 2013-02-21 NOTE — Progress Notes (Signed)
D) Pt. Continues on 1:1 for safety.  Pt. Reports no change in self harm feelings, and pt. Also reports no insight into stressors for these feelings.  A) Support given. R) Continues on q 15 min. Observations for safety and is safe at this time.  No self harm reported or noted.

## 2013-02-21 NOTE — [facility] Group Notes (Signed)
[facility] LCSW Aftercare Discharge Planning Group Note   02/21/2013 10:06 AM    Participation Quality:  Appropraite  Mood/Affect:  Appropriate  Depression Rating:  7  Anxiety Rating:  7  Thoughts of Suicide:  No  Will you contract for safety?   NA  Current AVH:  No  Plan for Discharge/Comments:  Patient attending discharge planning group and actively participated in group.  She will relocate to [place] a discharge.  Follow up scheudled inSW provided all participants with daily workbook and information on services offered by [facility].   Transportation Means: Patient has transportation.   Supports:  Patient has a support system.   Fusako Tanabe, Joesph July

## 2013-02-21 NOTE — Progress Notes (Signed)
[facility] Group Notes:  (Nursing/MHT/Case Management/Adjunct)  Date:  02/21/2013  Time:  2000  Type of Therapy:  Psychoeducational Skills  Participation Level:  Minimal  Participation Quality:  Resistant  Affect:  Depressed  Cognitive:  Lacking  Insight:  Lacking  Engagement in Group:  Lacking  Modes of Intervention:  Education  Summary of Progress/Problems: The patient was reluctant to speak up in group this evening. She did share with the group that she had a bad day. Her goal for tomorrow is to "keep my anxiety under control". She offered no details regarding her day.   Hazle Coca S 02/21/2013, 11:54 PM

## 2013-02-21 NOTE — Progress Notes (Signed)
Patient ID: Heidi Pearson, female   DOB: Jan 22, 1976, 37 y.o.   MRN: [id number] [facility] MD Progress Note  02/21/2013 9:56 PM HELMA ARGYLE  MRN:  [id number] Subjective: Patient states I can't do it! Life is too hard. 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 She is currently on 1:1 due to her suicidal ideations with gestures and inability to contract for safety. She notes that she has been a cutter since January and does this for anxiety reduction.  Diagnosis:   DSM5: Schizophrenia Disorders:   Obsessive-Compulsive Disorders:   Trauma-Stressor Disorders:   Substance/Addictive Disorders:   Depressive Disorders:    Axis I: ADHD, inattentive type, Chronic Paranoid Schizophrenia, Major Depression, Recurrent severe, Panic Disorder and Post Traumatic Stress Disorder  ADL's:  Impaired  Sleep: Fair  Appetite:  Fair  Suicidal Ideation:  Patient had a suicide attempt with overdosing on medication and contracts for safety in the hospital. Homicidal Ideation:  Denied AEB (as evidenced by):  Psychiatric Specialty Exam: ROS  Blood pressure 100/69, pulse 98, temperature 98.1 F (36.7 C), temperature source Oral, resp. rate 16, height 5\' 3"  (1.6 m), weight 94.348 kg (208 lb), last menstrual period 02/09/2013, SpO2 97.00%.Body mass index is 36.85 kg/(m^2).  General Appearance: Fairly Groomed  Patent attorney::  Fair  Speech:  Clear and Coherent  Volume:  Normal  Mood:  Anxious, Depressed, Hopeless and Worthless  Affect:  Depressed and Flat  Thought Process:  Goal Directed and Intact  Orientation:  Full (Time, Place, and Person)  Thought Content:  Rumination  Suicidal Thoughts:  Yes.  with intent/plan  Homicidal Thoughts:  No  Memory:  Immediate;   Fair  Judgement:  Impaired  Insight:  Lacking  Psychomotor Activity:  Psychomotor Retardation  Concentration:  Fair  Recall:  Fair   Akathisia:  NA  Handed:  Right  AIMS (if indicated):     Assets:  Communication Skills Desire for Improvement Physical Health Resilience Social Support  Sleep:  Number of Hours: 5.75   Current Medications: Current Facility-Administered Medications  Medication Dose Route Frequency Provider Last Rate Last Dose  . acetaminophen (TYLENOL) tablet 1,000 mg  1,000 mg Oral Once Court Joy, PA-C      . acetaminophen (TYLENOL) tablet 650 mg  650 mg Oral Q6H PRN Court Joy, PA-C   650 mg at 02/21/13 2006  . alum & mag hydroxide-simeth (MAALOX/MYLANTA) 200-200-20 MG/5ML suspension 30 mL  30 mL Oral Q4H PRN Court Joy, PA-C      . amphetamine-dextroamphetamine (ADDERALL) tablet 10 mg  10 mg Oral BID WC Nehemiah Settle, MD   10 mg at 02/21/13 1302  . clonazePAM (KLONOPIN) tablet 1 mg  1 mg Oral BID Nehemiah Settle, MD   1 mg at 02/21/13 1711  . magnesium hydroxide (MILK OF MAGNESIA) suspension 30 mL  30 mL Oral Daily PRN Court Joy, PA-C      . ondansetron [facility]) tablet 4 mg  4 mg Oral Q8H PRN Verne Spurr, PA-C   4 mg at 02/17/13 1633  . PARoxetine (PAXIL) tablet 30 mg  30 mg Oral Daily Nehemiah Settle, MD   30 mg at 02/21/13 0803  . traZODone (DESYREL) tablet 100 mg  100 mg Oral QHS PRN,MR X 1 Nehemiah Settle, MD   100 mg at 02/21/13 2150    Lab Results:  No results found  for this or any previous visit (from the past 48 hour(s)).  Physical Findings: AIMS: Facial and Oral Movements Muscles of Facial Expression: None, normal Lips and Perioral Area: None, normal Jaw: None, normal Tongue: None, normal,Extremity Movements Upper (arms, wrists, hands, fingers): None, normal Lower (legs, knees, ankles, toes): None, normal, Trunk Movements Neck, shoulders, hips: None, normal, Overall Severity Severity of abnormal movements (highest score from questions above): None, normal Incapacitation due to abnormal movements: None,  normal Patient's awareness of abnormal movements (rate only patient's report): No Awareness, Dental Status Current problems with teeth and/or dentures?: No Does patient usually wear dentures?: No  CIWA:  CIWA-Ar Total: 1 COWS:  COWS Total Score: 1  Treatment Plan Summary: Daily contact with patient to assess and evaluate symptoms and progress in treatment Medication management  Plan: Continue Adderall 10 mg daily morning and afternoon Increase Paxil 30 mg for depression Continue Klonopin for panic disorders Increase trazodone 100 mg for sleep without change. Treatment Plan/Recommendations:   1. Admit for crisis management and stabilization. 2. Medication management to reduce current symptoms to base line and improve the patient's overall level of functioning. 3. Treat health problems as indicated. 4. Develop treatment plan to decrease risk of relapse upon discharge and to reduce the need for readmission. 5. Psycho-social education regarding relapse prevention and self care. 6. Health care follow up as needed for medical problems. 7. Restart home medications where appropriate. 8. Patient may be discharge on Friday if she continues to contract for safety 9. Patient education done regarding self sabotaging and self defeating behaviors. 10. ELOS: 2-3 days.  Medical Decision Making Problem Points:  Established problem, worsening (2), New problem, with additional work-up planned (4), Review of last therapy session (1) and Review of psycho-social stressors (1) Data Points:  Review or order clinical lab tests (1) Review or order medicine tests (1) Review of medication regiment & side effects (2) Review of new medications or change in dosage (2)  I certify that inpatient services furnished can reasonably be expected to improve the patient's condition.  Rona Ravens. [place] RPAC 10:14 PM 02/21/2013  Reviewed the information documented and agree with the treatment  plan.  Albertine Lafoy,JANARDHAHA R. 02/21/2013 11:47 PM

## 2013-02-21 NOTE — Progress Notes (Signed)
D: Pt endorses passive SI.  Pt denies HI/AV. Pt is pleasant and cooperative. Pt came back from hospital, and was encouraged not to walk on ankle and use the wheelchair.   A: Pt was offered support and encouragement. Pt was given scheduled medications. Pt was encourage to attend groups. Q 15 minute checks were done for safety.   R:Pt attends groups and interacts  with peers and staff. Pt is taking medication.Pt receptive to treatment and safety maintained on unit.

## 2013-02-21 NOTE — Progress Notes (Signed)
1:1 Nursing note- Patient currently lying in bed asleep with eyes closed, respirations even and unlabored, no distress noted. Safety maintained with MHT at bedside. Patient has been up and active on the unit and compliant with medications earlier.

## 2013-02-21 NOTE — Progress Notes (Signed)
D) Pt. Cont. With blunted affect, depressed mood.  Pt. Reports thoughts of self-harm and unable to contract for safety.  Pt. Placed on 1:1 for continued observation for safety.  Pt. Reports feeling "bad" and unable to place why she is feeling "so bad".  A) Support given.  Level of observation increased per order.  R) Pt. Receptive and cooperative.  Attending groups and compliant with programming.

## 2013-02-22 NOTE — Progress Notes (Signed)
Observation note: Pt in hallway with sitter present.  Pt denies SI at this time. Pt contracts for safety. Pt remains on 1:1 on 1:1 observation for safety. Safety maintained.

## 2013-02-22 NOTE — Progress Notes (Signed)
Patient ID: Heidi Pearson, female   DOB: Jan 19, 1976, 37 y.o.   MRN: [id number] [facility] MD Progress Note  02/22/2013 6:50 PM Heidi Pearson  MRN:  [id number]  Subjective: Patient states she still very depressed. Still has suidical thoughts but no plan.  Diagnosis:   DSM5: Schizophrenia Disorders:   Obsessive-Compulsive Disorders:   Trauma-Stressor Disorders:   Substance/Addictive Disorders:   Depressive Disorders:    Axis I: ADHD, inattentive type, Chronic Paranoid Schizophrenia, Major Depression, Recurrent severe, Panic Disorder and Post Traumatic Stress Disorder  ADL's:  Impaired  Sleep: Fair  Appetite:  Fair  Suicidal Ideation:  Patient had a suicide attempt with overdosing on medication and contracts for safety in the hospital. Homicidal Ideation:  Denied AEB (as evidenced by):  Psychiatric Specialty Exam: ROS  Blood pressure 96/57, pulse 103, temperature 98.3 F (36.8 C), temperature source Oral, resp. rate 18, height 5\' 3"  (1.6 m), weight 94.348 kg (208 lb), last menstrual period 02/09/2013, SpO2 97.00%.Body mass index is 36.85 kg/(m^2).  General Appearance: Fairly Groomed  Patent attorney::  Fair  Speech:  Clear and Coherent  Volume:  Normal  Mood:  Anxious, Depressed, Hopeless and Worthless  Affect:  Depressed and Flat  Thought Process:  Goal Directed and Intact  Orientation:  Full (Time, Place, and Person)  Thought Content:  Rumination  Suicidal Thoughts:  Yes but no plans  Homicidal Thoughts:  No  Memory:  Immediate;   Fair  Judgement:  Impaired  Insight:  Lacking  Psychomotor Activity:  Psychomotor Retardation  Concentration:  Fair  Recall:  Fair  Akathisia:  NA  Handed:  Right  AIMS (if indicated):     Assets:  Communication Skills Desire for Improvement Physical Health Resilience Social Support  Sleep:  Number of Hours: 5.75   Current Medications: Current Facility-Administered Medications  Medication Dose Route Frequency Provider Last Rate Last  Dose  . acetaminophen (TYLENOL) tablet 1,000 mg  1,000 mg Oral Once Court Joy, PA-C      . acetaminophen (TYLENOL) tablet 650 mg  650 mg Oral Q6H PRN Court Joy, PA-C   650 mg at 02/22/13 0839  . alum & mag hydroxide-simeth (MAALOX/MYLANTA) 200-200-20 MG/5ML suspension 30 mL  30 mL Oral Q4H PRN Court Joy, PA-C      . amphetamine-dextroamphetamine (ADDERALL) tablet 10 mg  10 mg Oral BID WC Nehemiah Settle, MD   10 mg at 02/22/13 1249  . clonazePAM (KLONOPIN) tablet 1 mg  1 mg Oral BID Nehemiah Settle, MD   1 mg at 02/22/13 1617  . magnesium hydroxide (MILK OF MAGNESIA) suspension 30 mL  30 mL Oral Daily PRN Court Joy, PA-C      . ondansetron [facility]) tablet 4 mg  4 mg Oral Q8H PRN Verne Spurr, PA-C   4 mg at 02/17/13 1633  . PARoxetine (PAXIL) tablet 30 mg  30 mg Oral Daily Nehemiah Settle, MD   30 mg at 02/22/13 0756  . traZODone (DESYREL) tablet 100 mg  100 mg Oral QHS PRN,MR X 1 Nehemiah Settle, MD   100 mg at 02/22/13 0024    Lab Results:  No results found for this or any previous visit (from the past 48 hour(s)).  Physical Findings: AIMS: Facial and Oral Movements Muscles of Facial Expression: None, normal Lips and Perioral Area: None, normal Jaw: None, normal Tongue: None, normal,Extremity Movements Upper (arms, wrists, hands, fingers): None, normal Lower (legs, knees, ankles, toes): None, normal, Trunk Movements Neck,  shoulders, hips: None, normal, Overall Severity Severity of abnormal movements (highest score from questions above): None, normal Incapacitation due to abnormal movements: None, normal Patient's awareness of abnormal movements (rate only patient's report): No Awareness, Dental Status Current problems with teeth and/or dentures?: No Does patient usually wear dentures?: No  CIWA:  CIWA-Ar Total: 1 COWS:  COWS Total Score: 1  Treatment Plan Summary: Daily contact with patient to assess and evaluate  symptoms and progress in treatment Medication management  Plan: Continue current meds  Treatment Plan/Recommendations:   1. Admit for crisis management and stabilization. 2. Medication management to reduce current symptoms to base line and improve the patient's overall level of functioning. 3. Treat health problems as indicated. 4. Develop treatment plan to decrease risk of relapse upon discharge and to reduce the need for readmission. 5. Psycho-social education regarding relapse prevention and self care. 6. Health care follow up as needed for medical problems. 7. Restart home medications where appropriate. 8. Will continue 1;1 for safety and will consider to dc tomorrow 9. Patient education done regarding self sabotaging and self defeating behaviors. 10. ELOS: 2-3 days.   Medical Decision Making Problem Points:  Established problem, worsening (2), New problem, with additional work-up planned (4), Review of last therapy session (1) and Review of psycho-social stressors (1) Data Points:  Review or order clinical lab tests (1) Review or order medicine tests (1) Review of medication regiment & side effects (2) Review of new medications or change in dosage (2)  I certify that inpatient services furnished can reasonably be expected to improve the patient's condition.   Wonda Cerise 02/22/2013 6:50 PM

## 2013-02-22 NOTE — Progress Notes (Signed)
1:1 Nursing note (late entry d/t writer doing an admission) Patient currently lying in bed asleep with eyes closed and respirations even and unlabored. No distress noted, safety maintained with MHT at bedside. 1:1 continues and patient is safe.

## 2013-02-22 NOTE — [facility] Group Notes (Signed)
[facility] Group Notes:  (Clinical Social Work)  02/22/2013   3:00-4:00PM  Summary of Progress/Problems:   The main focus of today's process group was for the patient to identify ways in which they have sabotaged their own mental health wellness/recovery.  Motivational interviewing was used to explore the reasons they engage in this behavior, and reasons they may have for wanting to change.  The Stages of Change were explained to the group using a handout, and patients identified where they are with regard to changing self-defeating behaviors.  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  She now cuts herself as a means of handling her anxiety, because it gives her some relief.  She wants to change this, has made a Decision to do so, but then also states that the Decision is "as of today, and tomorrow may be different."  She is in Contemplation stage.  Type of Therapy:  Process Group  Participation Level:  Active  Participation Quality:  Attentive, Sharing and Supportive  Affect:  Anxious, Blunted and Depressed  Cognitive:  Oriented  Insight:  Engaged  Engagement in Therapy:  Engaged  Modes of Intervention:  Education, Motivational Interviewing   Ambrose Mantle, LCSW 02/22/2013, 4:21 PM

## 2013-02-22 NOTE — Progress Notes (Signed)
Patient came to medication window and requested her 8 am clonopin. Patient reports she almost had a panic attack in the shower this morning. Writer encouraged patient to try and wait until 8 am and she did not want to wait. Patient remains safe, 1:1 continues, medication received.

## 2013-02-22 NOTE — Progress Notes (Signed)
1:1 Nursing note on board. Writer was sitting with patient at this time. (See board)

## 2013-02-23 NOTE — Progress Notes (Signed)
Psychoeducational Group Note  Date: 02/23/2013 Time: 0930 Group Topic/Focus:  Gratefulness:  The focus of this group is to help patients identify what three things they are most grateful for in their lives. What helps ground them and to center them on their work to their recovery.  Participation Level:  Active  Participation Quality:  Appropriate  Affect:  Appropriate  Cognitive:  Alert  Insight:  Improving  Engagement in Group:  Engaged  Additional Comments:  Pt stated she was grateful for her dad and her 2 boys Dione Housekeeper

## 2013-02-23 NOTE — [facility] Group Notes (Signed)
[facility] Group Notes:  (Clinical Social Work)  02/23/2013   3:00-4:00PM  Summary of Progress/Problems:   The main focus of today's process group was to   identify the patient's current support system and decide on other supports that can be put in place.  The picture on workbook was used to discuss why additional supports are needed, and a hand-out was distributed with four definitions/levels of support, then used to talk about how patients have given and received all different kinds of support.  An emphasis was placed on using counselor, doctor, therapy groups, 12-step groups, and problem-specific support groups to expand supports.  The patient expressed full comprehension of the concepts presented, and agreed that there is a need to add more supports.  She may be interested in DBT groups.  Type of Therapy:  Process Group  Participation Level:  Active  Participation Quality:  Attentive, Sharing and Supportive  Affect:  Blunted  Cognitive:  Appropriate  Insight:  Engaged  Engagement in Therapy:  Engaged  Modes of Intervention:  Education,  Support and [employer], LCSW 02/23/2013, 4:18 PM

## 2013-02-23 NOTE — Progress Notes (Signed)
Patient ID: Heidi Pearson, female   DOB: 12/16/1975, 37 y.o.   MRN: [id number] D: Pt remains under 1:1 observation at this time. Pt endorses passive SI but she is able to contract for safety. She is participating in groups. Her affect is depressed but her mood is improved.   A: Pt encouraged pt to continue participating in the milieu and wait to observed by M.D. Writer informed M.D. That the pt condition has improved and that she would be safe without further 1:1 observation.   R: Pt is receptive and cooperative with staff and awaiting evaluation by M.D.

## 2013-02-23 NOTE — Progress Notes (Signed)
[facility] Group Notes:  (Nursing/MHT/Case Management/Adjunct)  Date:  02/22/2013 Time:  2000  Type of Therapy:  Psychoeducational Skills  Participation Level:  Active  Participation Quality:  Appropriate  Affect:  Appropriate  Cognitive:  Appropriate  Insight:  Good  Engagement in Group:  Improving  Modes of Intervention:  Education  Summary of Progress/Problems: The patient expressed in group that she had a better day than yesterday. She attributes her positive day to having fewer thoughts of harming herself. Her coping mechanisms include going to therapy following discharge. Her goal for tomorrow is to have a better day.   Ayza Ripoll S 02/23/2013, 1:26 AM

## 2013-02-23 NOTE — Progress Notes (Signed)
Date: 02/23/2013  Time: 1015  Group Topic/Focus:  Making Healthy Choices: The focus of this group is to help patients identify negative/unhealthy choices they were using prior to admission and identify positive/healthier coping strategies to replace them upon discharge.  Participation Level: Active  Participation Quality: Appropriate  Affect: Appropriate  Cognitive: Oriented  Insight: Improving  Engagement in Group: Improving  Additional Comments: was engaged in the discussion and partisipated  Balen Woolum A  02/23/2013  

## 2013-02-23 NOTE — Progress Notes (Signed)
Writer spoke with patient 1:1 and she reports that her day has been awful and she has had racing thoughts, feeling anxious and is not sure why she is feeling this way. She reports that she talked to her father on today and she plans to see a therapist in [place] once she moves. Patient denies si/hi/a/v hallucinations but reports that she still has urges to cut off and on. Support and encouragement offered, safety maintained with mht at patients side. Will continue to monitor.

## 2013-02-23 NOTE — Progress Notes (Signed)
1:1 nursing note- Patient currently lying in bed asleep with eyes closed and respirations even and unlabored, patient is safe with mht at bedside. Will continue to monitor.

## 2013-02-23 NOTE — Progress Notes (Signed)
1:1 Nursing note- Patient is currently preparing to get up and take a shower for the day. Patient in bathroom and voiced no complaints. MHT reports patient has an unsteady gait as she was walking to the bathroom this morning. Safety maintained and 1:1 continues.

## 2013-02-23 NOTE — Progress Notes (Signed)
Patient ID: Heidi Pearson, female   DOB: 07-01-1975, 37 y.o.   MRN: [id number] [facility] MD Progress Note  02/23/2013 6:03 PM MISHIKA FLIPPEN  MRN:  [id number]  Subjective: Reports SI thought at times but no plans to hurt herself at this time. Thinks she is not better now and feels 'strange'. Not able to describe her symptoms.   Diagnosis:   DSM5: Schizophrenia Disorders:   Obsessive-Compulsive Disorders:   Trauma-Stressor Disorders:   Substance/Addictive Disorders:   Depressive Disorders:    Axis I: ADHD, inattentive type, Chronic Paranoid Schizophrenia, Major Depression, Recurrent severe, Panic Disorder and Post Traumatic Stress Disorder  ADL's:  Impaired  Sleep: Fair  Appetite:  Fair  Suicidal Ideation:  Patient had a suicide attempt with overdosing on medication and contracts for safety in the hospital. Homicidal Ideation:  Denied AEB (as evidenced by):  Psychiatric Specialty Exam: ROS  Blood pressure 101/67, pulse 98, temperature 97.6 F (36.4 C), temperature source Oral, resp. rate 18, height 5\' 3"  (1.6 m), weight 94.348 kg (208 lb), last menstrual period 02/09/2013, SpO2 97.00%.Body mass index is 36.85 kg/(m^2).  General Appearance: Fairly Groomed  Patent attorney::  Fair  Speech:  Clear and Coherent  Volume:  Normal  Mood:  Anxious, Depressed, Hopeless and Worthless  Affect:  Depressed and Flat  Thought Process:  Goal Directed and Intact  Orientation:  Full (Time, Place, and Person)  Thought Content:  Rumination  Suicidal Thoughts:  Yes but no plans  Homicidal Thoughts:  No  Memory:  Immediate;   Fair  Judgement:  Impaired  Insight:  Lacking  Psychomotor Activity:  Psychomotor Retardation  Concentration:  Fair  Recall:  Fair  Akathisia:  NA  Handed:  Right  AIMS (if indicated):     Assets:  Communication Skills Desire for Improvement Physical Health Resilience Social Support  Sleep:  Number of Hours: 6   Current Medications: Current  Facility-Administered Medications  Medication Dose Route Frequency Provider Last Rate Last Dose  . acetaminophen (TYLENOL) tablet 1,000 mg  1,000 mg Oral Once Court Joy, PA-C      . acetaminophen (TYLENOL) tablet 650 mg  650 mg Oral Q6H PRN Court Joy, PA-C   650 mg at 02/22/13 0839  . alum & mag hydroxide-simeth (MAALOX/MYLANTA) 200-200-20 MG/5ML suspension 30 mL  30 mL Oral Q4H PRN Court Joy, PA-C      . amphetamine-dextroamphetamine (ADDERALL) tablet 10 mg  10 mg Oral BID WC Nehemiah Settle, MD   10 mg at 02/23/13 1309  . clonazePAM (KLONOPIN) tablet 1 mg  1 mg Oral BID Nehemiah Settle, MD   1 mg at 02/23/13 1635  . magnesium hydroxide (MILK OF MAGNESIA) suspension 30 mL  30 mL Oral Daily PRN Court Joy, PA-C      . ondansetron [facility]) tablet 4 mg  4 mg Oral Q8H PRN Verne Spurr, PA-C   4 mg at 02/17/13 1633  . PARoxetine (PAXIL) tablet 30 mg  30 mg Oral Daily Nehemiah Settle, MD   30 mg at 02/23/13 0865  . traZODone (DESYREL) tablet 100 mg  100 mg Oral QHS PRN,MR X 1 Nehemiah Settle, MD   100 mg at 02/22/13 2220    Lab Results:  No results found for this or any previous visit (from the past 48 hour(s)).  Physical Findings: AIMS: Facial and Oral Movements Muscles of Facial Expression: None, normal Lips and Perioral Area: None, normal Jaw: None, normal Tongue: None, normal,Extremity Movements  Upper (arms, wrists, hands, fingers): None, normal Lower (legs, knees, ankles, toes): None, normal, Trunk Movements Neck, shoulders, hips: None, normal, Overall Severity Severity of abnormal movements (highest score from questions above): None, normal Incapacitation due to abnormal movements: None, normal Patient's awareness of abnormal movements (rate only patient's report): No Awareness, Dental Status Current problems with teeth and/or dentures?: No Does patient usually wear dentures?: No  CIWA:  CIWA-Ar Total: 1 COWS:  COWS Total  Score: 1  Treatment Plan Summary: Daily contact with patient to assess and evaluate symptoms and progress in treatment Medication management  Plan: Continue current meds  Treatment Plan/Recommendations:   1. Admit for crisis management and stabilization. 2. Medication management to reduce current symptoms to base line and improve the patient's overall level of functioning. 3. Treat health problems as indicated. 4. Develop treatment plan to decrease risk of relapse upon discharge and to reduce the need for readmission. 5. Psycho-social education regarding relapse prevention and self care. 6. Health care follow up as needed for medical problems. 7. Restart home medications where appropriate. 8. Will continue 1;1 for safety  9. Patient education done regarding self sabotaging and self defeating behaviors. 10. ELOS: 2-3 days.   Medical Decision Making Problem Points:  Established problem, worsening (2), New problem, with additional work-up planned (4), Review of last therapy session (1) and Review of psycho-social stressors (1) Data Points:  Review or order clinical lab tests (1) Review or order medicine tests (1) Review of medication regiment & side effects (2) Review of new medications or change in dosage (2)  I certify that inpatient services furnished can reasonably be expected to improve the patient's condition.   Wonda Cerise 02/23/2013 6:03 PM

## 2013-02-23 NOTE — Progress Notes (Signed)
D) Pt attending the groups and interacting with her peers. States that she has a hard time in the mornings and feels very down. Rates her depression and hopelessness both at a 7 and admits to thoughts of SI on and off. Is pleasant at times. At this time she is unable to contract for her safety. A) Remains on a 1:1 for her safety.  R) Continues on a 1:1. Please see hard copy notes in chart.

## 2013-02-24 MED ORDER — TRAZODONE HCL 100 MG PO TABS
100.0000 mg | ORAL_TABLET | Freq: Every evening | ORAL | Status: DC | PRN
  Administered 2013-02-24 – 2013-02-27 (×4): 100 mg via ORAL
  Filled 2013-02-24 (×2): qty 1
  Filled 2013-02-24: qty 14
  Filled 2013-02-24 (×2): qty 1

## 2013-02-24 NOTE — Progress Notes (Signed)
Pt attended spiritual care group on grief and loss facilitated by chaplain Burnis Kingfisher.  Group opened with brief discussion and psycho-social ed around grief and loss in relationships and in relation to self - identifying life patterns, circumstances, changes that cause losses. Established group norm of speaking from own life experience.  Group goal of establishing open and affirming space for members to share loss and experience with grief, normalize grief experience and provide psycho social education and grief support. Group members discussed guilt related to decisions made in life - expressing grief around the ways they have hurt others, changes in relationships, and difficulty in forgiving self and accepting forgiveness from others.  Also discussed loss of family members without saying goodbye, focusing on theme of regret around things left unsaid.  Group discussed ways different family members    Giulietta spoke with group about how guilt is interwoven into her feelings of grief and loss, describing feeling separation from those she has hurt, isolation, and difficulty in feeling worthy of forgiveness.  Other group members affirmed and normalized [place]'s feelings.  Jevon described being able to "think through" reasons she should not feel guilty, but has not been able to feel and comfort in this.    Royston Cowper, Bronson Ing MDiv

## 2013-02-24 NOTE — Progress Notes (Signed)
Patient ID: Rica Koyanagi, female   DOB: June 01, 1975, 37 y.o.   MRN: [id number] [facility] MD Progress Note  02/24/2013 9:31 PM BERNETHA ANSCHUTZ  MRN:  [id number]  Subjective: Tida today says she feels "blah" but doesn't know why, states "limbo" type of feeling. She notes that she now hears her name being called. Reports that she is not able to focus but is taking her Adderall just doesn't know why she doesn't seem to be getting any better. In fact she feels that she may be worse than when she came in. Sstates her depression is a 3/10 and her anxiety is a 8/10. Diagnosis:   DSM5: Schizophrenia Disorders:   Obsessive-Compulsive Disorders:   Trauma-Stressor Disorders:   Substance/Addictive Disorders:   Depressive Disorders:    Axis I: ADHD, inattentive type, Chronic Paranoid Schizophrenia, Major Depression, Recurrent severe, Panic Disorder and Post Traumatic Stress Disorder  ADL's:  Impaired  Sleep: Fair  Appetite:  Fair  Suicidal Ideation:  Patient had a suicide attempt with overdosing on medication and contracts for safety in the hospital. Homicidal Ideation:  Denied AEB (as evidenced by):  Psychiatric Specialty Exam: ROS  Blood pressure 102/62, pulse 103, temperature 98.1 F (36.7 C), temperature source Oral, resp. rate 18, height 5\' 3"  (1.6 m), weight 94.348 kg (208 lb), last menstrual period 02/09/2013, SpO2 97.00%.Body mass index is 36.85 kg/(m^2).  General Appearance: Fairly Groomed  Patent attorney::  Fair  Speech:  Clear and Coherent  Volume:  Normal  Mood:  Anxious, Depressed, Hopeless and Worthless  Affect:  Depressed and Flat  Thought Process:  Goal Directed and Intact  Orientation:  Full (Time, Place, and Person)  Thought Content:  Rumination  Suicidal Thoughts:  Yes but no plans  Homicidal Thoughts:  No  Memory:  Immediate;   Fair  Judgement:  Impaired  Insight:  Lacking  Psychomotor Activity:  Psychomotor Retardation  Concentration:  Fair  Recall:  Fair   Akathisia:  NA  Handed:  Right  AIMS (if indicated):     Assets:  Communication Skills Desire for Improvement Physical Health Resilience Social Support  Sleep:  Number of Hours: 6.25   Current Medications: Current Facility-Administered Medications  Medication Dose Route Frequency Provider Last Rate Last Dose  . acetaminophen (TYLENOL) tablet 1,000 mg  1,000 mg Oral Once Court Joy, PA-C      . acetaminophen (TYLENOL) tablet 650 mg  650 mg Oral Q6H PRN Court Joy, PA-C   650 mg at 02/22/13 0839  . alum & mag hydroxide-simeth (MAALOX/MYLANTA) 200-200-20 MG/5ML suspension 30 mL  30 mL Oral Q4H PRN Court Joy, PA-C      . amphetamine-dextroamphetamine (ADDERALL) tablet 10 mg  10 mg Oral BID WC Nehemiah Settle, MD   10 mg at 02/24/13 1258  . clonazePAM (KLONOPIN) tablet 1 mg  1 mg Oral BID Nehemiah Settle, MD   1 mg at 02/24/13 1633  . magnesium hydroxide (MILK OF MAGNESIA) suspension 30 mL  30 mL Oral Daily PRN Court Joy, PA-C      . ondansetron [facility]) tablet 4 mg  4 mg Oral Q8H PRN Verne Spurr, PA-C   4 mg at 02/17/13 1633  . PARoxetine (PAXIL) tablet 30 mg  30 mg Oral Daily Nehemiah Settle, MD   30 mg at 02/24/13 0802  . traZODone (DESYREL) tablet 100 mg  100 mg Oral QHS PRN Verne Spurr, PA-C        Lab Results:  No results  found for this or any previous visit (from the past 48 hour(s)).  Physical Findings: AIMS: Facial and Oral Movements Muscles of Facial Expression: None, normal Lips and Perioral Area: None, normal Jaw: None, normal Tongue: None, normal,Extremity Movements Upper (arms, wrists, hands, fingers): None, normal Lower (legs, knees, ankles, toes): None, normal, Trunk Movements Neck, shoulders, hips: None, normal, Overall Severity Severity of abnormal movements (highest score from questions above): None, normal Incapacitation due to abnormal movements: None, normal Patient's awareness of abnormal movements (rate  only patient's report): No Awareness, Dental Status Current problems with teeth and/or dentures?: No Does patient usually wear dentures?: No  CIWA:  CIWA-Ar Total: 2 COWS:  COWS Total Score: 2  Treatment Plan Summary: Daily contact with patient to assess and evaluate symptoms and progress in treatment Medication management  Plan: Continue current meds  Treatment Plan/Recommendations:   1. Continue crisis management and stabilization. 2. Medication management to reduce current symptoms to base line and improve the patient's overall level of functioning. 3. Treat health problems as indicated. 4. Develop treatment plan to decrease risk of relapse upon discharge and to reduce the need for readmission. 5. Psycho-social education regarding relapse prevention and self care. 6. Health care follow up as needed for medical problems. 7. Restart home medications where appropriate. 8. Will continue 1;1 for safety  9. Patient education done regarding self sabotaging and self defeating behaviors. 10. Patient is likely at base line and is scheduled to be d/c on Tues.  Medical Decision Making Problem Points:  Established problem, worsening (2), New problem, with additional work-up planned (4), Review of last therapy session (1) and Review of psycho-social stressors (1) Data Points:  Review or order clinical lab tests (1) Review or order medicine tests (1) Review of medication regiment & side effects (2) Review of new medications or change in dosage (2)  I certify that inpatient services furnished can reasonably be expected to improve the patient's condition.   MASHBURN,NEIL 02/24/2013 9:31 PM  Reviewed the information documented and agree with the treatment plan.  Kazuto Sevey,JANARDHAHA R. 02/25/2013 12:31 PM

## 2013-02-24 NOTE — Progress Notes (Signed)
Adult Psychoeducational Group Note  Date:  02/24/2013 Time:  11:00am Group Topic/Focus:  Self Care:   The focus of this group is to help patients understand the importance of self-care in order to improve or restore emotional, physical, spiritual, interpersonal, and financial health.  Participation Level:  Active  Participation Quality:  Appropriate and Attentive  Affect:  Appropriate  Cognitive:  Alert and Appropriate  Insight: Appropriate  Engagement in Group:  Engaged  Modes of Intervention:  Discussion and Education  Additional Comments:   Pt attended and participated in group. Discussion today was on self care. When ask What am I like when I am feeling well and What do I need to do less often to keep my overall wellbeing? Pt stated when she is happy she is more social,caring to others, and more energy.  Shelly Bombard D 02/24/2013, 1:54 PM

## 2013-02-24 NOTE — Tx Team (Signed)
Interdisciplinary Treatment Plan Update (Adult)  Date: 02/24/2013  Time Reviewed:  9:45 AM  Progress in Treatment: Attending groups: Yes Participating in groups:  Yes Taking medication as prescribed:  Yes Tolerating medication:  Yes Family/Significant othe contact made: Yes Patient understands diagnosis:  Yes Discussing patient identified problems/goals with staff:  Yes Medical problems stabilized or resolved:  Yes Denies suicidal/homicidal ideation: Yes Issues/concerns per patient self-inventory:  Yes Other:  New problem(s) identified: N/A  Discharge Plan or Barriers: Pt has follow up scheduled for medication management and therapy in [place] where she plans to go and stay with father upon d/c.    Reason for Continuation of Hospitalization: Anxiety Depression Medication Stabilization  Comments: N/A  Estimated length of stay: 1 day, d/c tomorrow  For review of initial/current patient goals, please see plan of care.  Attendees: Patient:     Family:     Physician:  Dr. Javier Glazier 02/24/2013 12:17 PM   Nursing:   Quintella Reichert, RN 02/24/2013 12:17 PM   Clinical Social Worker:  Reyes Ivan, LCSWA 02/24/2013 12:17 PM   Other: Verne Spurr, PA 02/24/2013 12:17 PM   Other:  Frankey Shown, MA care coordination 02/24/2013 12:17 PM   Other:  Neill Loft, RN 02/24/2013 12:17 PM   Other:     Other:    Other:    Other:    Other:    Other:    Other:     Scribe for Treatment Team:   Carmina Miller, 02/24/2013 12:17 PM

## 2013-02-24 NOTE — Progress Notes (Signed)
[facility] Group Notes:  (Nursing/MHT/Case Management/Adjunct)  Date:  02/23/2013 Time:  2000  Type of Therapy:  Psychoeducational Skills  Participation Level:  Minimal  Participation Quality:  Resistant  Affect:  Depressed  Cognitive:  Appropriate  Insight:  Improving  Engagement in Group:  Limited  Modes of Intervention:  Exploration  Summary of Progress/Problems: The patient had little to share in group this evening. She indicated that her goal for tomorrow is to have the 1:1 observation discontinued. As a theme for today, she mentioned that her support system consists of her father and stepmother.   Hazle Coca S 02/24/2013, 12:39 AM

## 2013-02-24 NOTE — [facility] Group Notes (Signed)
[facility] LCSW Group Therapy  02/24/2013  1:15 PM   Type of Therapy:  Group Therapy  Participation Level:  Active  Participation Quality:  Appropriate and Attentive  Affect:  Appropriate, Flat and Depressed  Cognitive:  Alert and Appropriate  Insight:  Developing/Improving and Engaged  Engagement in Therapy:  Developing/Improving and Engaged  Modes of Intervention:  Clarification, Confrontation, Discussion, Education, Exploration, Limit-setting, Orientation, Problem-solving, Rapport Building, Dance movement psychotherapist, Socialization and Support  Summary of Progress/Problems: Pt identified obstacles faced currently and processed barriers involved in overcoming these obstacles. Pt identified steps necessary for overcoming these obstacles and explored motivation (internal and external) for facing these difficulties head on. Pt further identified one area of concern in their lives and chose a goal to focus on for today.  Pt shared that her obstacle is guilt and not being able to forgive herself for her past.  Pt states that others haven forgiven her but she feels she doesn't deserve forgiveness.  Pt states that she plans to talk to her therapist about this to work through it.  Pt actively participated and was engaged in group discussion.    Reyes Ivan, LCSWA 02/24/2013 3:09 PM

## 2013-02-24 NOTE — Progress Notes (Signed)
Adult Psychoeducational Group Note  Date:  02/24/2013 Time:  10:34 PM  Group Topic/Focus:  Wrap-Up Group:   The focus of this group is to help patients review their daily goal of treatment and discuss progress on daily workbooks.  Participation Level:  Active  Participation Quality:  Appropriate  Affect:  Flat  Cognitive:  Appropriate  Insight: Appropriate  Engagement in Group:  Engaged  Modes of Intervention:  Support  Additional Comments:  Pt stated one good thing that happened was that she was able to come off of her 1:1  Lyan Moyano 02/24/2013, 10:34 PM

## 2013-02-24 NOTE — Progress Notes (Signed)
1:1 Nsg note- Patient lyingin bed asleep with eyes closed and respirations even and unlabored. No distress noted, Safety maintained on unit with mht at bedside. 1:1 continues.

## 2013-02-24 NOTE — Progress Notes (Addendum)
0745   Patient sitting with 1:1 in dayroom talking to 1:1 and nurse.  Patient stated she has thoughts of using pencil to scratch/stab/cut herself.  Patient does contract for safety.  Denied HI.   Stated she heard voice at 0600 calling her name but does not hear voice telling her to hurt herself or others.  Denied visual hallucinations.  Refused breakfast, gave her ginger ale to drink.  Respirations even and unlabored.  No signs/symptoms of pain or discomfort on patient's face or body movements.  Will continue to monitor for safety with 1:1 per MD orders.   0945  Patient sitting in dayroom talking to peers.  1:1 present.  Safety maintained.  No signs of pain/discomfort noted on patient's face or body movements.  Respirations even and unlabored.  Patient continues to contract for safety.  SI continues.  Denied HI.  Denied A/V hallucinations at this time.  Will continue to monitor for safety with 1:1 per MD orders.    1000  D:  Patient's self inventory sheet, patient sleeps well, poor appetite, low energy level, improved attention span.  Rated depression  And hopeless #5.  SI off/on, contracts for safety.  Has felt lightheaded.  Plans to take meds after discharge.  Not sure of discharge plans.  No problems taking meds after discharge. A:  Medications administered per MD Orders. R:  Denied HI.   Voices called name this morning.  Denied visions.  1045  1:1 discontinued per MD verbal order.  Patient is appropriate, alert, no complaints.  Respirations even and unlabored.  Talking, smiled, appropriate.  Denied HI.  Denied A/V hallucinations at this time.  SI off/on, contracts for safety.   No signs/symptoms of pain/distress noted on patient's face/body movements.  Patient stated she would talk to staff about self harm thoughts if needed.   Will continue to monitor patient for safety.  1245   Patient returned from dining room lunch.  Alert, appropriate.  Respirations even and unlabored, No signs of pain/distress  noted on patient's face or body movements.  Appropriate mood.  Stated "voices are still calling my name."  Denied HI.  SI, contracts or safety.  Denied visual hallucinations.    1445  Patient has had appropriate behavior, alert, cooperative.  Respirations even and unlabored.  No signs/symptoms of pain/discomfort noted on patient's face/body movements.  Patient has been talking, smiling, in dayroom talking to peers.  Patient stated everything is the same, no changes.  Denied HI.  Denied A/V hallucinations.  SI off/on contracts for safety.   Assured staff she would talk to staff if SI thoughts continue or she feels need to hurt self.  1645  Patient has been appropriate, alert, cooperative.  Respirations even and unlabored.  No signs/symptoms of pain/discomfort noted on patient's face/body movements.  Talking to peers/staff, smiling in dayroom.  SI off/on, contracts for safety.   Denied HI.  Hears her name being called.  Denied visual hallucinations.  Keeps hearing phone ring which is not ringing.  Will talk with staff if changes.  Will continue to monitor patient for safety.

## 2013-02-24 NOTE — BHH Group Notes (Signed)
[facility] LCSW Aftercare Discharge Planning Group Note   02/24/2013 8:45 AM  Participation Quality:  Alert and Appropriate   Mood/Affect:  Appropriate, Flat and Depressed  Depression Rating:  3  Anxiety Rating:  3  Thoughts of Suicide:  Pt denies HI, endorses off and on SI  Will you contract for safety?   Yes  Current AVH:  Pt denies  Plan for Discharge/Comments:  Pt attended discharge planning group and actively participated in group.  CSW provided pt with today's workbook.  Pt reports feeling "so-so" today.  Pt reports a plan to return to [facility] with her father and will follow up there.  No further needs voiced by pt at this time.    Transportation Means: Pt reports access to transportation - reports a plan to fly to [facility] upon d/c  Supports: No supports mentioned at this time  Reyes Ivan, LCSWA 02/24/2013 10:23 AM

## 2013-02-24 NOTE — Progress Notes (Signed)
1:1 Nursing note- Patient lying in bed asleep with eyes closed and respirations even. No distress noted. 1:1 continues and patient is safe. MHT at patients bedside.

## 2013-02-25 NOTE — Progress Notes (Signed)
Recreation Therapy Notes   Date: 10.14.2014 Time: 2:45pm Location: 500 Hall Dayroom  Group Topic: Animal Assisted Activities (AAA)  Behavioral Response: Engaged, Attentive  Affect: Euthymic  Clinical Observations/Feedback: Dog Team: Raliegh & handler. Patient interacted appropriately with peer, dog team, LRT and MHT.   Day Deery L Mashanda Ishibashi, LRT/CTRS  Keysha Damewood L 02/25/2013 4:11 PM 

## 2013-02-25 NOTE — Progress Notes (Signed)
Patient ID: Heidi Pearson, female   DOB: [birth year], 37 y.o.   MRN: [id number] D-Patient reports she slept well and that her ability to pay attention is improving.  Her appetite is poor and she rates her depression at 6/10.  She reports thoughts of self harm off and on and she does contract for safety.  Patient says that she feels guilty about everything and tends to turn positive things into something to feel guilty about.  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  She also says she is hearing her name called and this is a new development.  A- Talked with patient  about using coping skills to change negative thoughts and encouraged her to make of list of everything she is grateful for.  R- Patient says she will work on the list.

## 2013-02-25 NOTE — Progress Notes (Addendum)
D: Patient denies HI/AVH. Pt endorses SI but agrees to contract. Patient rates hopelessness as 6,  depression as 6, and anxiety as 8.  Patient affect is flat. Mood is depressed.  Pt states, "I've been really bad past 2 days and I have no idea why.  I need help."  Patient did attend evening group. Patient visible on the milieu. No distress noted. A: Support and encouragement offered. Scheduled medications given to pt. Q 15 min checks continued for patient safety. R: Patient receptive. Patient remains safe on the unit.

## 2013-02-25 NOTE — Progress Notes (Signed)
The focus of this group is to educate the patient on the purpose and policies of crisis stabilization and provide a format to answer questions about their admission.  The group details unit policies and expectations of patients while admitted.  Patient attended 0900 nurse education orientation group this morning.  Patient actively participated, appropriate affect, alert, appropriate insight and engagement.  Today patient will work on 3 goals for discharge.  

## 2013-02-25 NOTE — Progress Notes (Signed)
Patient ID: Heidi Pearson, female   DOB: 1975-10-02, 37 y.o.   MRN: [id number] [facility] MD Progress Note  02/26/2013 2:01 PM Heidi Pearson  MRN:  [id number]  Subjective: Heidi Pearson today says she still feels "blah" but doesn't know why, states "limbo" type of feeling. Patient states she feels that it could be the increase in the Paxil as she doesn't think it's working.  Diagnosis:   DSM5: Schizophrenia Disorders:   Obsessive-Compulsive Disorders:   Trauma-Stressor Disorders:   Substance/Addictive Disorders:   Depressive Disorders:    Axis I: ADHD, inattentive type, Chronic Paranoid Schizophrenia, Major Depression, Recurrent severe, Panic Disorder and Post Traumatic Stress Disorder  ADL's:  Impaired  Sleep: Fair  Appetite:  Fair  Suicidal Ideation:  Patient had a suicide attempt with overdosing on medication and contracts for safety in the hospital. Homicidal Ideation:  Denied AEB (as evidenced by):  Psychiatric Specialty Exam: ROS  Blood pressure 109/79, pulse 92, temperature 98 F (36.7 C), temperature source Oral, resp. rate 16, height 5\' 3"  (1.6 m), weight 94.348 kg (208 lb), last menstrual period 02/09/2013, SpO2 97.00%.Body mass index is 36.85 kg/(m^2).  General Appearance: Fairly Groomed  Patent attorney::  Fair  Speech:  Clear and Coherent  Volume:  Normal  Mood:  Anxious, Depressed, Hopeless and Worthless  Affect:  Depressed and Flat  Thought Process:  Goal Directed and Intact  Orientation:  Full (Time, Place, and Person)  Thought Content:  Rumination  Suicidal Thoughts:  Yes but no plans  Homicidal Thoughts:  No  Memory:  Immediate;   Fair  Judgement:  Impaired  Insight:  Lacking  Psychomotor Activity:  Psychomotor Retardation  Concentration:  Fair  Recall:  Fair  Akathisia:  NA  Handed:  Right  AIMS (if indicated):     Assets:  Communication Skills Desire for Improvement Physical Health Resilience Social Support  Sleep:  Number of Hours: 6.25    Current Medications: Current Facility-Administered Medications  Medication Dose Route Frequency Provider Last Rate Last Dose  . acetaminophen (TYLENOL) tablet 1,000 mg  1,000 mg Oral Once Court Joy, PA-C      . acetaminophen (TYLENOL) tablet 650 mg  650 mg Oral Q6H PRN Court Joy, PA-C   650 mg at 02/22/13 0839  . alum & mag hydroxide-simeth (MAALOX/MYLANTA) 200-200-20 MG/5ML suspension 30 mL  30 mL Oral Q4H PRN Court Joy, PA-C      . amphetamine-dextroamphetamine (ADDERALL) tablet 10 mg  10 mg Oral BID WC Nehemiah Settle, MD   10 mg at 02/26/13 1253  . clonazePAM (KLONOPIN) tablet 1 mg  1 mg Oral BID Nehemiah Settle, MD   1 mg at 02/26/13 0814  . magnesium hydroxide (MILK OF MAGNESIA) suspension 30 mL  30 mL Oral Daily PRN Court Joy, PA-C      . ondansetron [facility]) tablet 4 mg  4 mg Oral Q8H PRN Verne Spurr, PA-C   4 mg at 02/17/13 1633  . PARoxetine (PAXIL) tablet 30 mg  30 mg Oral Daily Nehemiah Settle, MD   30 mg at 02/26/13 0815  . traZODone (DESYREL) tablet 100 mg  100 mg Oral QHS PRN Nehemiah Settle, MD   100 mg at 02/25/13 2246    Lab Results:  No results found for this or any previous visit (from the past 48 hour(s)).  Physical Findings: AIMS: Facial and Oral Movements Muscles of Facial Expression: None, normal Lips and Perioral Area: None, normal Jaw: None, normal Tongue:  None, normal,Extremity Movements Upper (arms, wrists, hands, fingers): None, normal Lower (legs, knees, ankles, toes): None, normal, Trunk Movements Neck, shoulders, hips: None, normal, Overall Severity Severity of abnormal movements (highest score from questions above): None, normal Incapacitation due to abnormal movements: None, normal Patient's awareness of abnormal movements (rate only patient's report): No Awareness, Dental Status Current problems with teeth and/or dentures?: No Does patient usually wear dentures?: No  CIWA:  CIWA-Ar  Total: 2 COWS:  COWS Total Score: 2  Treatment Plan Summary: Daily contact with patient to assess and evaluate symptoms and progress in treatment Medication management  Plan: Continue current meds  Treatment Plan/Recommendations:   1. Continue crisis management and stabilization. 2. Medication management to reduce current symptoms to base line and improve the patient's overall level of functioning. 3. Treat health problems as indicated. 4. Develop treatment plan to decrease risk of relapse upon discharge and to reduce the need for readmission. 5. Psycho-social education regarding relapse prevention and self care. 6. Health care follow up as needed for medical problems. 7. Restart home medications where appropriate. 8. Will continue 1;1 for safety  9. Patient education done regarding self sabotaging and self defeating behaviors. 10. Patient is likely at base line and is scheduled to be d/c on Wednesday. Medical Decision Making Problem Points:  Established problem, worsening (2), New problem, with additional work-up planned (4), Review of last therapy session (1) and Review of psycho-social stressors (1) Data Points:  Review or order clinical lab tests (1) Review or order medicine tests (1) Review of medication regiment & side effects (2) Review of new medications or change in dosage (2)   Heidi Pearson 02/25/2013  Reviewed the information documented and agree with the treatment plan.  Heidi Schlink,JANARDHAHA R. 02/26/2013 3:10 PM

## 2013-02-25 NOTE — Progress Notes (Signed)
Adult Psychoeducational Group Note  Date:  02/25/2013 Time:  9:41 PM  Group Topic/Focus:  Wrap-Up Group:   The focus of this group is to help patients review their daily goal of treatment and discuss progress on daily workbooks.  Participation Level:  Minimal  Participation Quality:  Appropriate  Affect:  Appropriate  Cognitive:  Alert and Oriented  Insight: Appropriate  Engagement in Group:  Developing/Improving  Modes of Intervention:  Clarification, Exploration and Support  Additional Comments:  Patient stated that one positive is that she was able to talk to her dad. Patient stated that she learned today from her group that she wants to get out of here and work on her getting therapy and attend na meetings. Patient stated that she wanted to work on her anxiety.  Kerrianne Jeng, Randal Buba 02/25/2013, 9:41 PM

## 2013-02-25 NOTE — Progress Notes (Signed)
Recreation Therapy Notes  Date: 10.13.2014 Time: 3:00pm Location: 500 Hall Dayroom  Group Topic: Communication, Team Building, Problem Solving  Goal Area(s) Addresses:  Patient will effectively work with peer towards shared goal.  Patient will identify skill used to make activity successful.  Patient will identify how skills used during activity can be used to reach post d/c goals.   Behavioral Response: Engaged, Appropriate   Intervention: Problem Solving Activitiy  Activity: Life Boat. Patients were given a scenario about being on a sinking yacht. Patients were informed the yacht included 15 guest, 8 of which could be placed on the life boat, along with all group members. Individuals on guest list were of varying socioeconomic classes such as a Education officer, museum, Materials engineer, Midwife, Tree surgeon.   Education: Customer service manager, Discharge Planning   Education Outcome: Acknowledges understanding  Clinical Observations/Feedback: Patient actively engaged in activity, voicing her opinion and debating with group members appropriately. Patient identified team work as a Marketing executive needed to make activity successful. Patient made no additional contributions to group discussion, but appeared to actively listen as she maintained appropriate eye contact with speaker.  [name], LRT/CTRS  [name] 02/25/2013 9:23 AM

## 2013-02-25 NOTE — Progress Notes (Signed)
Adult Psychoeducational Group Note  Date:  02/25/2013 Time:  11:00am Group Topic/Focus:  Recovery Goals:   The focus of this group is to identify appropriate goals for recovery and establish a plan to achieve them.  Participation Level:  Active  Participation Quality:  Appropriate and Attentive  Affect:  Appropriate  Cognitive:  Alert and Appropriate  Insight: Appropriate  Engagement in Group:  Engaged  Modes of Intervention:  Discussion and Education  Additional Comments:   Pt attended and participated in group. Discussion today was on recovery and what is their definition of recovery and what was their breaking point. Pt stated recovery means staying in a straight line not going back to bad habits. Her breaking point was her physical pain she wanted relief from it and used heroin and became addicted.  Shelly Bombard D 02/25/2013, 1:43 PM

## 2013-02-25 NOTE — Progress Notes (Addendum)
[facility] Adult Case Management Discharge Plan :  Will you be returning to the same living situation after discharge: No. At discharge, do you have transportation home?  Yes  Aunt will pick up Do you have the ability to pay for your medications:Yes,  mental health  Release of information consent forms completed and in the chart;  Patient's signature needed at discharge.  Patient to Follow up at: Follow-up Information   Follow up with Cleta [facility]ts [facility] On 03/26/2013. (Wednesday, March 26, 2013 at 1:15 PM)    Contact information:   [address] [place], [place]  [address]  [phone]      Patient denies SI/HI:   Yes,  yes    Safety Planning and Suicide Prevention discussed:  Yes,  yes  Trianna states she will stay with her aunt for several days while she gets her plane ticket to [facility] and then fly to be with her father.  Daryel Gerald B 02/25/2013, 10:43 AM

## 2013-02-25 NOTE — BHH Group Notes (Signed)
BHH LCSW Group Therapy  02/25/2013  1:15 PM   Type of Therapy:  Group Therapy  Participation Level:  Active  Participation Quality:  Appropriate and Attentive  Affect:  Appropriate, Flat and Depressed  Cognitive:  Alert and Appropriate  Insight:  Developing/Improving and Engaged  Engagement in Therapy:  Developing/Improving and Engaged  Modes of Intervention:  Clarification, Confrontation, Discussion, Education, Exploration, Limit-setting, Orientation, Problem-solving, Rapport Building, Dance movement psychotherapist, Socialization and Support  Summary of Progress/Problems: The topic for group therapy was feelings about diagnosis.  Pt actively participated in group discussion on their past and current diagnosis and how they feel towards this.  Pt also identified how society and family members judge them, based on their diagnosis as well as stereotypes and stigmas.   Pt shared that she has been diagnosed with depression, PTSD and anxiety and accepts these diagnosis as she feels there is nothing she can do about it.  CSW confronted pt on this thought process and pt changed it to not being able to change it, right now.  Pt discussed feeling safe here but outside of the hospital not having any support.  CSW discussed available support groups in the community and pt was not receptive, stating "what do you, walk up to people and ask if they are recovering or crazy".  Pt presents helpless and a lack of insight on how to work on her recovery.    Reyes Ivan, LCSWA 02/25/2013 2:46 PM

## 2013-02-26 NOTE — BHH Group Notes (Signed)
[facility] LCSW Aftercare Discharge Planning Group Note   02/26/2013 9:48 AM  Participation Quality:  Did not attend group.  Heidi Pearson, Heidi Pearson

## 2013-02-26 NOTE — Progress Notes (Signed)
D: Patient's affect is appropriate to circumstance, but mood is depressed. She reported on the self inventory sheet that she slept well last night, appetite is poor, energy level is low and ability to pay attention is improving. Patient rated depression and feelings of hopelessness "8". She's attending and participating in groups. Patient is compliant with medications.  A: Support and encouragement provided to patient. Administered scheduled medications per ordering MD. Monitor Q15 minute checks for safety.  R: Patient receptive. Passive SI, but contracts for safety. Denies HI. Patient remains safe on the unit.

## 2013-02-26 NOTE — Progress Notes (Signed)
Patient ID: Heidi Pearson, female   DOB: 1975-12-08, 37 y.o.   MRN: [id number]  D: Pt stated she thinks her meds need to be changed. Stated that "since the increase of her paxil, she's been in a fog".  Pt informed the writer that prozac seemed to work better. Pt appeared to be groggy thru most of the shift, but she didn't seem to notice after mentioning from the writer.   A:  Support and encouragement was offered. 15 min checks continued for safety.  R: Pt remains safe.

## 2013-02-26 NOTE — Progress Notes (Signed)
Patient ID: Heidi Pearson, female   DOB: Dec 30, 1975, 37 y.o.   MRN: [id number]  D: Pt asked and received permission from the mht to attend NA group. Once group was over, Clinical research associate informed pt that she is not to go to the 300 hall unless order is written by her Dr. Rock Nephew stated she thought it was ok, since someone had gone over to watch a game. Pt informed writer that she was supposed to have left today, however she "felt worst now, than she did when she came in". Stated she's to be started on prozac and weaned off Lexapro.  A:  Support and encouragement was offered. 15 min checks continued for safety.  R: Pt remains safe.

## 2013-02-26 NOTE — BHH Group Notes (Signed)
BHH LCSW Group Therapy  Emotional Regulation 1:15 - 2: 30 PM        02/26/2013  12:35 PM   Type of Therapy:  Group Therapy  Participation Level:  Appropriate  Participation Quality:  Appropriate  Affect:  Appropriate  Cognitive:  Attentive Appropriate  Insight:  Engaged  Engagement in Therapy:  Engaged  Modes of Intervention:  Discussion Exploration Problem-Solving Supportive  Summary of Progress/Problems:  Group topic was emotional regulations.  Patient participated in the discussion and was able to identify an emotion that needed to regulated.  Patient shared she deals with a guilt for years of drug use.  Wynn Banker 02/26/2013 12:35 PM

## 2013-02-27 MED ORDER — FLUOXETINE HCL 20 MG PO CAPS
20.0000 mg | ORAL_CAPSULE | Freq: Every day | ORAL | Status: DC
  Administered 2013-02-27 – 2013-02-28 (×2): 20 mg via ORAL
  Filled 2013-02-27 (×4): qty 1

## 2013-02-27 MED ORDER — PAROXETINE HCL 20 MG PO TABS
20.0000 mg | ORAL_TABLET | Freq: Every day | ORAL | Status: DC
  Administered 2013-02-28: 20 mg via ORAL
  Filled 2013-02-27 (×2): qty 1

## 2013-02-27 NOTE — ED Provider Notes (Signed)
Medical screening examination/treatment/procedure(s) were performed by non-physician practitioner and as supervising physician I was immediately available for consultation/collaboration.  Marrell Dicaprio R. Larken Urias, MD 02/27/13 1410 

## 2013-02-27 NOTE — BHH Group Notes (Signed)
[facility] LCSW Group Therapy  [facility] 1:15 - 2:30 PM  02/27/2013   2:39 PM   Type of Therapy:  Group Therapy  Participation Level:  Minimal  Participation Quality:  Attentive  Affect:  Appropriate  Cognitive:  Appropriate  Insight:  Developing/Improving  Engagement in Therapy:  Developing/Improving  Modes of Intervention:  Discussion, Education, Exploration, Problem-Solving, Rapport Building, Support   Summary of Progress/Problems:  Patient listened attentively to speaker from [employer]. She offered no comments on the presentation.  Wynn Banker 02/27/2013 2:39 PM

## 2013-02-27 NOTE — Progress Notes (Signed)
  Date:  02/27/2013 Time: 10:30AM  Group Topic/Focus:  Crisis Planning:   The purpose of this group is to help patients create a crisis plan for use upon discharge or in the future, as needed.  Participation Level:  Active  Participation Quality:  Appropriate, Sharing and Supportive  Affect:  Appropriate  Cognitive:  Appropriate  Insight: Appropriate and Good  Engagement in Group:  Engaged and Supportive  Modes of Intervention:  Discussion, Education and Support  Additional Comments:  Pt attended group, pt was appropriate.   Raistlin Gum M 02/27/2013, 3:06 PM  

## 2013-02-27 NOTE — Progress Notes (Signed)
D: When patient is in direct contact with RN staff, she presents with a depressed mood and affect; however writer has observed that the patient is very sociable with most peers on the unit and is often talking, laughing and joking with others. Patient has voiced that she's been here for a while now and would like to be leaving, but is very concern about her medications, specifically the Paxil; she verbalized that the Paxil is suppose to be switched to Prozac. Patient reported on the self inventory sheet that she's sleeping well, appetite is poor, energy level is low and ability to pay attention is improving. She's rating depression and feelings of hopelessness "6". Patient is going to groups throughout the day and continues to be compliant with medications.  A: Support and encouragement provided to patient. Scheduled medications administered per MD orders. Maintain Q15 minute checks for safety.  R: Patient receptive. Denies SI/HI/AVH. Patient remains safe.

## 2013-02-27 NOTE — Progress Notes (Addendum)
Recreation Therapy Notes  Date: 10.15.2014 Time: 3:00pm Location: 500 Hall Dayroom  Group Topic: Problem Solving  Goal Area(s) Addresses:  Patient will successfully target area of improvement.  Patient will identify what steps they need to take to accomplish improvements.   Behavioral Response: Engaged, Appropriate, Sharing  Intervention: Journaling  Activity: 40-20-10-5. Patient was asked to identify one thing they want to improve in their lives using 40 words. Patient was then asked to cut the numbers of words used to describe area of improvement to 20, then 10 and finally 5.   Education: Problem Solving, Coping Skills, Discharge Planning.   Education Outcome: Acknowledges understanding  Clinical Observations/Feedback: Patient engaged in group session, but was unable to meet the full requirements of the activity. Patient stated she was not able to write 40 words about an area she wanted to focus on. LRT advised patient to write as many words as possible, for remaining rounds patient was asked to cut the amount of words used in half. Patient shared she was able to narrow her area of improvement to the words "NA" and "Therapy." Patient additionally stated she knows and understands steps she needs to stay focused on her self-improvement, as well as how to access meetings and therapy appointments. Patient additionally appeared to actively listen to peer statements and group discussion, as she maintained appropriate eye contact with speaker.    Marykay Lex Kendrew Paci, LRT/CTRS   Dajahnae Vondra L 02/27/2013 9:52 AM

## 2013-02-27 NOTE — Progress Notes (Signed)
Pt attended karaoke group.  

## 2013-02-27 NOTE — Progress Notes (Addendum)
Pt presents with a flat affect and depressed mood during our conversation. She verbalizes having anxiety as well. Pt affect is brightened when observed interacting with others. She reports the discharge plan of moving in with her father in [place]. She reports having a good relationship with her father. Pt encouraged to seek help in [place] if needed for a crisis. Pt is currently denying any SI. Pt attended group this evening. Pt requesting Trazodone for sleep. Writer will fulfill this pt's request.   A: Trazodone administered for sleep to this pt. Continued support and availability as needed was extended to this pt. Staff continue to monitor pt with q57min checks.  R: No adverse drug reactions noted. Pt receptive to treatment. Pt remains safe at this time.

## 2013-02-27 NOTE — Progress Notes (Signed)
Patient ID: Heidi Pearson, female   DOB: 12-12-1975, 37 y.o.   MRN: [id number] Patient ID: Heidi Pearson, female   DOB: 1975-11-14, 37 y.o.   MRN: [id number] [facility] MD Progress Note  02/27/2013 2:44 PM Heidi Pearson  MRN:  [id number]  Subjective: Heidi Pearson today says she still feels very fatigued still and finds it hard to get out of bed. She is still ruminating on why she feels the way she feels. States she is still anxious and some depression. Lapses into guilt over putting her family through all of this.   Diagnosis:   DSM5: Schizophrenia Disorders:   Obsessive-Compulsive Disorders:   Trauma-Stressor Disorders:   Substance/Addictive Disorders:   Depressive Disorders:    Axis I: ADHD, inattentive type, Chronic Paranoid Schizophrenia, Major Depression, Recurrent severe, Panic Disorder and Post Traumatic Stress Disorder  ADL's:  Impaired  Sleep: Fair  Appetite:  Fair  Suicidal Ideation:  Patient had a suicide attempt with overdosing on medication and contracts for safety in the hospital. Homicidal Ideation:  Denied AEB (as evidenced by):  Psychiatric Specialty Exam: ROS  Blood pressure 116/78, pulse 88, temperature 97.8 F (36.6 C), temperature source Oral, resp. rate 16, height 5\' 3"  (1.6 m), weight 94.348 kg (208 lb), last menstrual period 02/09/2013, SpO2 97.00%.Body mass index is 36.85 kg/(m^2).  General Appearance: Fairly Groomed  Patent attorney::  Fair  Speech:  Clear and Coherent  Volume:  Normal  Mood:  Anxious, Depressed, Hopeless and Worthless  Affect:  Depressed and Flat  Thought Process:  Goal Directed and Intact  Orientation:  Full (Time, Place, and Person)  Thought Content:  Rumination  Suicidal Thoughts:  Yes but no plans  Homicidal Thoughts:  No  Memory:  Immediate;   Fair  Judgement:  Impaired  Insight:  Lacking  Psychomotor Activity:  Psychomotor Retardation  Concentration:  Fair  Recall:  Fair  Akathisia:  NA  Handed:  Right  AIMS (if  indicated):     Assets:  Communication Skills Desire for Improvement Physical Health Resilience Social Support  Sleep:  Number of Hours: 6.75   Current Medications: Current Facility-Administered Medications  Medication Dose Route Frequency Provider Last Rate Last Dose  . acetaminophen (TYLENOL) tablet 1,000 mg  1,000 mg Oral Once Court Joy, PA-C      . acetaminophen (TYLENOL) tablet 650 mg  650 mg Oral Q6H PRN Court Joy, PA-C   650 mg at 02/26/13 1821  . alum & mag hydroxide-simeth (MAALOX/MYLANTA) 200-200-20 MG/5ML suspension 30 mL  30 mL Oral Q4H PRN Court Joy, PA-C      . amphetamine-dextroamphetamine (ADDERALL) tablet 10 mg  10 mg Oral BID WC Nehemiah Settle, MD   10 mg at 02/27/13 1245  . clonazePAM (KLONOPIN) tablet 1 mg  1 mg Oral BID Nehemiah Settle, MD   1 mg at 02/27/13 0813  . FLUoxetine (PROZAC) capsule 20 mg  20 mg Oral Daily Verne Spurr, PA-C      . magnesium hydroxide (MILK OF MAGNESIA) suspension 30 mL  30 mL Oral Daily PRN Court Joy, PA-C      . ondansetron [facility]) tablet 4 mg  4 mg Oral Q8H PRN Verne Spurr, PA-C   4 mg at 02/17/13 1633  . [START ON 02/28/2013] PARoxetine (PAXIL) tablet 20 mg  20 mg Oral Daily Verne Spurr, PA-C      . traZODone (DESYREL) tablet 100 mg  100 mg Oral QHS PRN Nehemiah Settle, MD  100 mg at 02/26/13 2134    Lab Results:  No results found for this or any previous visit (from the past 48 hour(s)).  Physical Findings: AIMS: Facial and Oral Movements Muscles of Facial Expression: None, normal Lips and Perioral Area: None, normal Jaw: None, normal Tongue: None, normal,Extremity Movements Upper (arms, wrists, hands, fingers): None, normal Lower (legs, knees, ankles, toes): None, normal, Trunk Movements Neck, shoulders, hips: None, normal, Overall Severity Severity of abnormal movements (highest score from questions above): None, normal Incapacitation due to abnormal movements:  None, normal Patient's awareness of abnormal movements (rate only patient's report): No Awareness, Dental Status Current problems with teeth and/or dentures?: No Does patient usually wear dentures?: No  CIWA:  CIWA-Ar Total: 2 COWS:  COWS Total Score: 2  Treatment Plan Summary: Daily contact with patient to assess and evaluate symptoms and progress in treatment Medication management  Plan: Continue current meds  Treatment Plan/Recommendations:   1. Continue crisis management and stabilization. 2. Medication management to reduce current symptoms to base line and improve the patient's overall level of functioning. 3. Treat health problems as indicated. 4. Develop treatment plan to decrease risk of relapse upon discharge and to reduce the need for readmission. 5. Psycho-social education regarding relapse prevention and self care. 6. Health care follow up as needed for medical problems. 7. Restart home medications where appropriate. 8. Will continue to decrease paxil slowly to d/c. 9. Will initiate Prozac 20mg  as planned.  Medical Decision Making Problem Points:  Established problem, worsening (2), New problem, with additional work-up planned (4), Review of last therapy session (1) and Review of psycho-social stressors (1) Data Points:  Review or order clinical lab tests (1) Review or order medicine tests (1) Review of medication regiment & side effects (2) Review of new medications or change in dosage (2)  I certify that inpatient services furnished can reasonably be expected to improve the patient's condition.  Rona Ravens. Mashburn RPAC 4:32 PM 02/27/2013  Reviewed the information documented and agree with the treatment plan.  Colby Catanese,JANARDHAHA R. 02/28/2013 12:43 PM

## 2013-02-28 LAB — TSH: TSH: 1.449 u[IU]/mL (ref 0.350–4.500)

## 2013-02-28 MED ORDER — FLUOXETINE HCL 20 MG PO CAPS: 20.0000 mg | ORAL_CAPSULE | Freq: Every day | ORAL | Status: DC

## 2013-02-28 MED ORDER — AMPHETAMINE-DEXTROAMPHETAMINE 10 MG PO TABS: 10.0000 mg | ORAL_TABLET | Freq: Two times a day (BID) | ORAL | Status: DC

## 2013-02-28 MED ORDER — TRAZODONE HCL 100 MG PO TABS: ORAL_TABLET | ORAL | Status: DC

## 2013-02-28 NOTE — [facility] Suicide Risk Assessment (Signed)
Suicide Risk Assessment  Discharge Assessment     Demographic Factors:  Adolescent or young adult, Caucasian, Low socioeconomic status and Unemployed  Mental Status Per Nursing Assessment::   On Admission:  Self-harm thoughts  Current Mental Status by Physician: Self-harm thoughts and Self-harm behaviors  Loss Factors: Financial problems/change in socioeconomic status  Historical Factors: Prior suicide attempts, Family history of mental illness or substance abuse and Impulsivity  Risk Reduction Factors:   Sense of responsibility to family, Religious beliefs about death, Living with another person, especially a relative, Positive social support, Positive therapeutic relationship and Positive coping skills or problem solving skills  Continued Clinical Symptoms:  Severe Anxiety and/or Agitation Depression:   Impulsivity Recent sense of peace/wellbeing Unstable or Poor Therapeutic Relationship Previous Psychiatric Diagnoses and Treatments  Cognitive Features That Contribute To Risk:  Polarized thinking    Suicide Risk:  Mild:  Suicidal ideation of limited frequency, intensity, duration, and specificity.  There are no identifiable plans, no associated intent, mild dysphoria and related symptoms, good self-control (both objective and subjective assessment), few other risk factors, and identifiable protective factors, including available and accessible social support.  Discharge Diagnoses:   AXIS I:  ADHD, inattentive type, Major Depression, Recurrent severe, Panic Disorder and Post Traumatic Stress Disorder AXIS II:  Borderline Personality Dis. AXIS III:   Past Medical History  Diagnosis Date  . Anxiety   . Chronic knee pain   . Depression   . Anxiety    AXIS IV:  economic problems, occupational problems, other psychosocial or environmental problems, problems related to social environment and problems with primary support group AXIS V:  51-60 moderate symptoms  Plan Of  Care/Follow-up recommendations:  Activity:  As tolerated Diet:  Regular  Is patient on multiple antipsychotic therapies at discharge:  No   Has Patient had three or more failed trials of antipsychotic monotherapy by history:  No  Recommended Plan for Multiple Antipsychotic Therapies: NA  Heidi Pearson,Heidi R. 02/28/2013, 12:36 PM

## 2013-02-28 NOTE — Progress Notes (Signed)
Adult Psychoeducational Group Note  Date:  02/28/2013 Time:  10:51 AM  Group Topic/Focus:  Relapse Prevention Planning:   The focus of this group is to define relapse and discuss the need for planning to combat relapse.  Participation Level:  Active  Participation Quality:  Appropriate  Affect:  Appropriate  Cognitive:  Appropriate  Insight: Appropriate  Engagement in Group:  Engaged  Modes of Intervention:  Activity and Discussion  Additional Comments:   Pts watched a video "Recognizing Depression". Afterwards, pts dicussed how they related to the individuals on the video and how recognizing the signs of depressions could prevent episodes of breakdown. Each pt received a journal and where encouraged to write their thoughts, feelings, goals, and ideas down daily.    Tora Perches N 02/28/2013, 10:51 AM

## 2013-02-28 NOTE — Tx Team (Addendum)
Interdisciplinary Treatment Plan Update   Date Reviewed:  02/28/2013  Time Reviewed:  9:46 AM  Progress in Treatment:   Attending groups: Yes Participating in groups: Yes Taking medication as prescribed: Yes  Tolerating medication: Yes Family/Significant other contact made: Yes, contact made with aunt and father  understands diagnosis: Yes  Discussing patient identified problems/goals with staff: Yes Medical problems stabilized or resolved: Yes Denies suicidal/homicidal ideation: Yes Patient has not harmed self or others: Yes  For review of initial/current patient goals, please see plan of care.  Estimated Length of Stay:  Discharge today  Reasons for Continued Hospitalization:   New Problems/Goals identified:    Discharge Plan or Barriers:   Home with outpatient follow up scheduled with [facility] in [place], [place]  Additional Comments:  N/A  Attendees:  Patient: Heidi Pearson 02/28/2013 9:46 AM   Signature: Mervyn Gay, MD 02/28/2013 9:46 AM  Signature:  Verne Spurr, PA 02/28/2013 9:46 AM  Signature:  Harold Barban, RN 02/28/2013 9:46 AM  Signature:   Leighton Parody, RN 02/28/2013 9:46 AM  Signature:   Becky Augusta, RN 02/28/2013 9:46 AM  Signature:  Juline Patch, LCSW 02/28/2013 9:46 AM  Signature:  Reyes Ivan, LCSW 02/28/2013 9:46 AM  Signature:  Sharin Grave Coordinator 02/28/2013 9:46 AM  Signature:  Lamount Cranker, RN 02/28/2013 9:46 AM  Signature: 02/28/2013  9:46 AM  Signature:    Signature:      Scribe for Treatment Team:   Juline Patch,  02/28/2013 9:46 AM

## 2013-02-28 NOTE — [facility] Group Notes (Addendum)
[facility] LCSW Aftercare Discharge Planning Group Note   02/28/2013 10:36 AM    Participation Quality:  Appropraite  Mood/Affect:  Appropriate  Depression Rating:  6  Anxiety Rating:  8  Thoughts of Suicide:  No  Will you contract for safety?   NA  Current AVH:  No  Plan for Discharge/Comments:  Patient attending discharge planning group and actively participated in group.  Patient advised of continued plan to go to [place], [place] to live with father.  Follow up has been scheduled with [facility].  CSW provided all participants with daily workbook.   Transportation Means: Patient has transportation.   Supports:  Patient has a support system.   Avonne Berkery, Joesph July

## 2013-02-28 NOTE — Discharge Summary (Signed)
Physician Discharge Summary Note  Patient:  Heidi Pearson is an 37 y.o., female MRN:  [id number] DOB:  Nov 19, 1975 Patient phone:  [phone] (home)  Patient address:   [address]  [place] [address],   Date of Admission:  02/17/2013 Date of Discharge: 02/28/2013  Reason for Admission:  Suicidal ideation  Discharge Diagnoses: Active Problems:   Panic attacks   PTSD (post-traumatic stress disorder)   MDD (major depressive disorder)   ADHD (attention deficit hyperactivity disorder)  ROS  DSM5: Schizophrenia Disorders:  Obsessive-Compulsive Disorders:  Trauma-Stressor Disorders: Posttraumatic Stress Disorder (309.81)  Substance/Addictive Disorders:  Depressive Disorders: Major Depressive Disorder - Severe (296.23)  AXIS I: ADHD, inattentive type, Major Depression, Recurrent severe and Post Traumatic Stress Disorder  AXIS II: Borderline Personality disorder, cluster B AXIS III:  Past Medical History   Diagnosis  Date   .  Anxiety    .  Chronic knee pain    .  Depression    .  Anxiety     AXIS IV: other psychosocial or environmental problems, problems related to social environment and problems with primary support group  AXIS V: 41-50 serious symptoms   Level of Care:  OP  Hospital Course:  Heidi Pearson is an 37 y.o. single like female admitted involuntarily and emergently from [facility] for self-injurious behavior and overdosed on her medication-Ativan times 6 tablets.  She was transferred to [facility] when medically cleared.       Heidi Pearson was evaluated and her symptoms were identified. She was started on medication and encouraged to participate in unit programming. She responded well to a therapeutic environment and supportive atmosphere. She was set to discharge out but on the day of discharge Heidi Pearson stated that she felt suicidal and could not contract for safety. She felt that her celexa was too strong and requested that she be changed to Prozac as that had worked well  for her in the past. She continued to report vague complaints that she felt "Blah" but could not elaborate any further about her symptoms. She was observed laughing and smiling with other patients to a degree that did not coincide with her reports of depression. Her appetite and sleeping patterns did not decrease and she continued to be consistent with her ADLs.      It was felt that Anarely may be malingering as she got closer to her next discharge date her symptoms became more vague.  She was found to be "coaching" another patient into asking for 1:1 observation to avoid discharge as well.         When confronted with crossing boundaries, Heidi Pearson became tearful and wanted to negotiate for a longer stay in the hospital. She was felt to be at her baseline and explained that a longer stay in the hospital would not change this.  She was discharged out to be picked up by her aunt with whom she would stay until she would fly to [place] to stay with her father. Heidi Pearson did leave the hospital in improved condition than upon arrival and agreed to follow up as noted below. Consults:  None  Significant Diagnostic Studies:  labs: CBC, CMP. UA, UDS  Discharge Vitals:   Blood pressure 120/85, pulse 105, temperature 98 F (36.7 C), temperature source Oral, resp. rate 16, height 5\' 3"  (1.6 m), weight 94.348 kg (208 lb), last menstrual period 02/09/2013, SpO2 97.00%. Body mass index is 36.85 kg/(m^2). Lab Results:   Results for orders placed during the hospital encounter of  02/17/13 (from the past 72 hour(s))  TSH     Status: None   Collection Time    02/27/13  8:14 PM      Result Value Range   TSH 1.449  0.350 - 4.500 uIU/mL   Comment: Performed at [facility]    Physical Findings: AIMS: Facial and Oral Movements Muscles of Facial Expression: None, normal Lips and Perioral Area: None, normal Jaw: None, normal Tongue: None, normal,Extremity Movements Upper (arms, wrists, hands, fingers):  None, normal Lower (legs, knees, ankles, toes): None, normal, Trunk Movements Neck, shoulders, hips: None, normal, Overall Severity Severity of abnormal movements (highest score from questions above): None, normal Incapacitation due to abnormal movements: None, normal Patient's awareness of abnormal movements (rate only patient's report): No Awareness, Dental Status Current problems with teeth and/or dentures?: No Does patient usually wear dentures?: No  CIWA:  CIWA-Ar Total: 2 COWS:  COWS Total Score: 2  Psychiatric Specialty Exam: See Psychiatric Specialty Exam and Suicide Risk Assessment completed by Attending Physician prior to discharge.  Discharge destination:  Home  Is patient on multiple antipsychotic therapies at discharge:  No   Has Patient had three or more failed trials of antipsychotic monotherapy by history:  No  Recommended Plan for Multiple Antipsychotic Therapies: NA  Discharge Orders   Future Orders Complete By Expires   Diet - low sodium heart healthy  As directed    Discharge instructions  As directed    Comments:     Take all of your medications as directed. Be sure to keep all of your follow up appointments.  If you are unable to keep your follow up appointment, call your Doctor's office to let them know, and reschedule.  Make sure that you have enough medication to last until your appointment. Be sure to get plenty of rest. Going to bed at the same time each night will help. Try to avoid sleeping during the day.  Increase your activity as tolerated. Regular exercise will help you to sleep better and improve your mental health. Eating a heart healthy diet is recommended. Try to avoid salty or fried foods. Be sure to avoid all alcohol and illegal drugs.   Increase activity slowly  As directed      Medication List       Indication   amphetamine-dextroamphetamine 10 MG tablet  Commonly known as:  ADDERALL  Take 1 tablet (10 mg total) by mouth 2 (two) times  daily with a meal. For ADHD   Indication:  Attention Deficit Disorder     clonazePAM 1 MG tablet  Commonly known as:  KLONOPIN  Take 1 tablet (1 mg total) by mouth 2 (two) times daily. For anxiety and panic   Indication:  Panic Disorder     FLUoxetine 20 MG capsule  Commonly known as:  PROZAC  Take 1 capsule (20 mg total) by mouth daily. For depression.   Indication:  Depression     traZODone 100 MG tablet  Commonly known as:  DESYREL  Take one tablet at bedtime if needed for insomnia.   Indication:  Trouble Sleeping           Follow-up Information   Follow up with Cleta Alberts - [facility] On 03/26/2013. (Wednesday, March 26, 2013 at 1:15 PM)    Contact information:   [address] [place], [place]  [address]  [phone]      Follow-up recommendations:   Activities: Resume activity as tolerated. Diet: Heart healthy low sodium diet  Tests: Follow up testing will be determined by your out patient provider. Comments:    Total Discharge Time:  Greater than 30 minutes.  Signed: Rona Ravens. Mashburn RPAC 1:01 PM 02/28/2013  Patient is seen personally for psych assessment, suicidal risk assessment and discussed with physician extenders. Developed discharge treatment plans and reviewed the information documented and agree with the treatment plan.Darrol Jump R. 03/04/2013 1:08 PM

## 2013-02-28 NOTE — Progress Notes (Signed)
Discharge Note: Discharge instructions/prescriptions/medication samples given to patient. Patient verbalized understanding of discharge instructions and prescriptions. Returned belongings to patient. Denies SI/HI at this time. Patient d/c without incident to the lobby and transported home by aunt.

## 2013-03-05 NOTE — Progress Notes (Signed)
Patient Discharge Instructions:  After Visit Summary (AVS):   Faxed to:  03/05/13 Discharge Summary Note:   Faxed to:  03/05/13 Psychiatric Admission Assessment Note:   Faxed to:  03/05/13 Suicide Risk Assessment - Discharge Assessment:   Faxed to:  03/05/13 Faxed/Sent to the Next Level Care provider:  03/05/13 Faxed to [facility] @ [phone]  Jerelene Redden, 03/05/2013, 2:33 PM

## 2013-10-16 IMAGING — CR DG KNEE 1-2V*R*
2 series · 2 of 2 positions shown · non-contrast
Comparison: 08/15/2012

CLINICAL DATA: Right knee pain for 1 month

RIGHT KNEE - 1-2 VIEW

[t knee ap right]
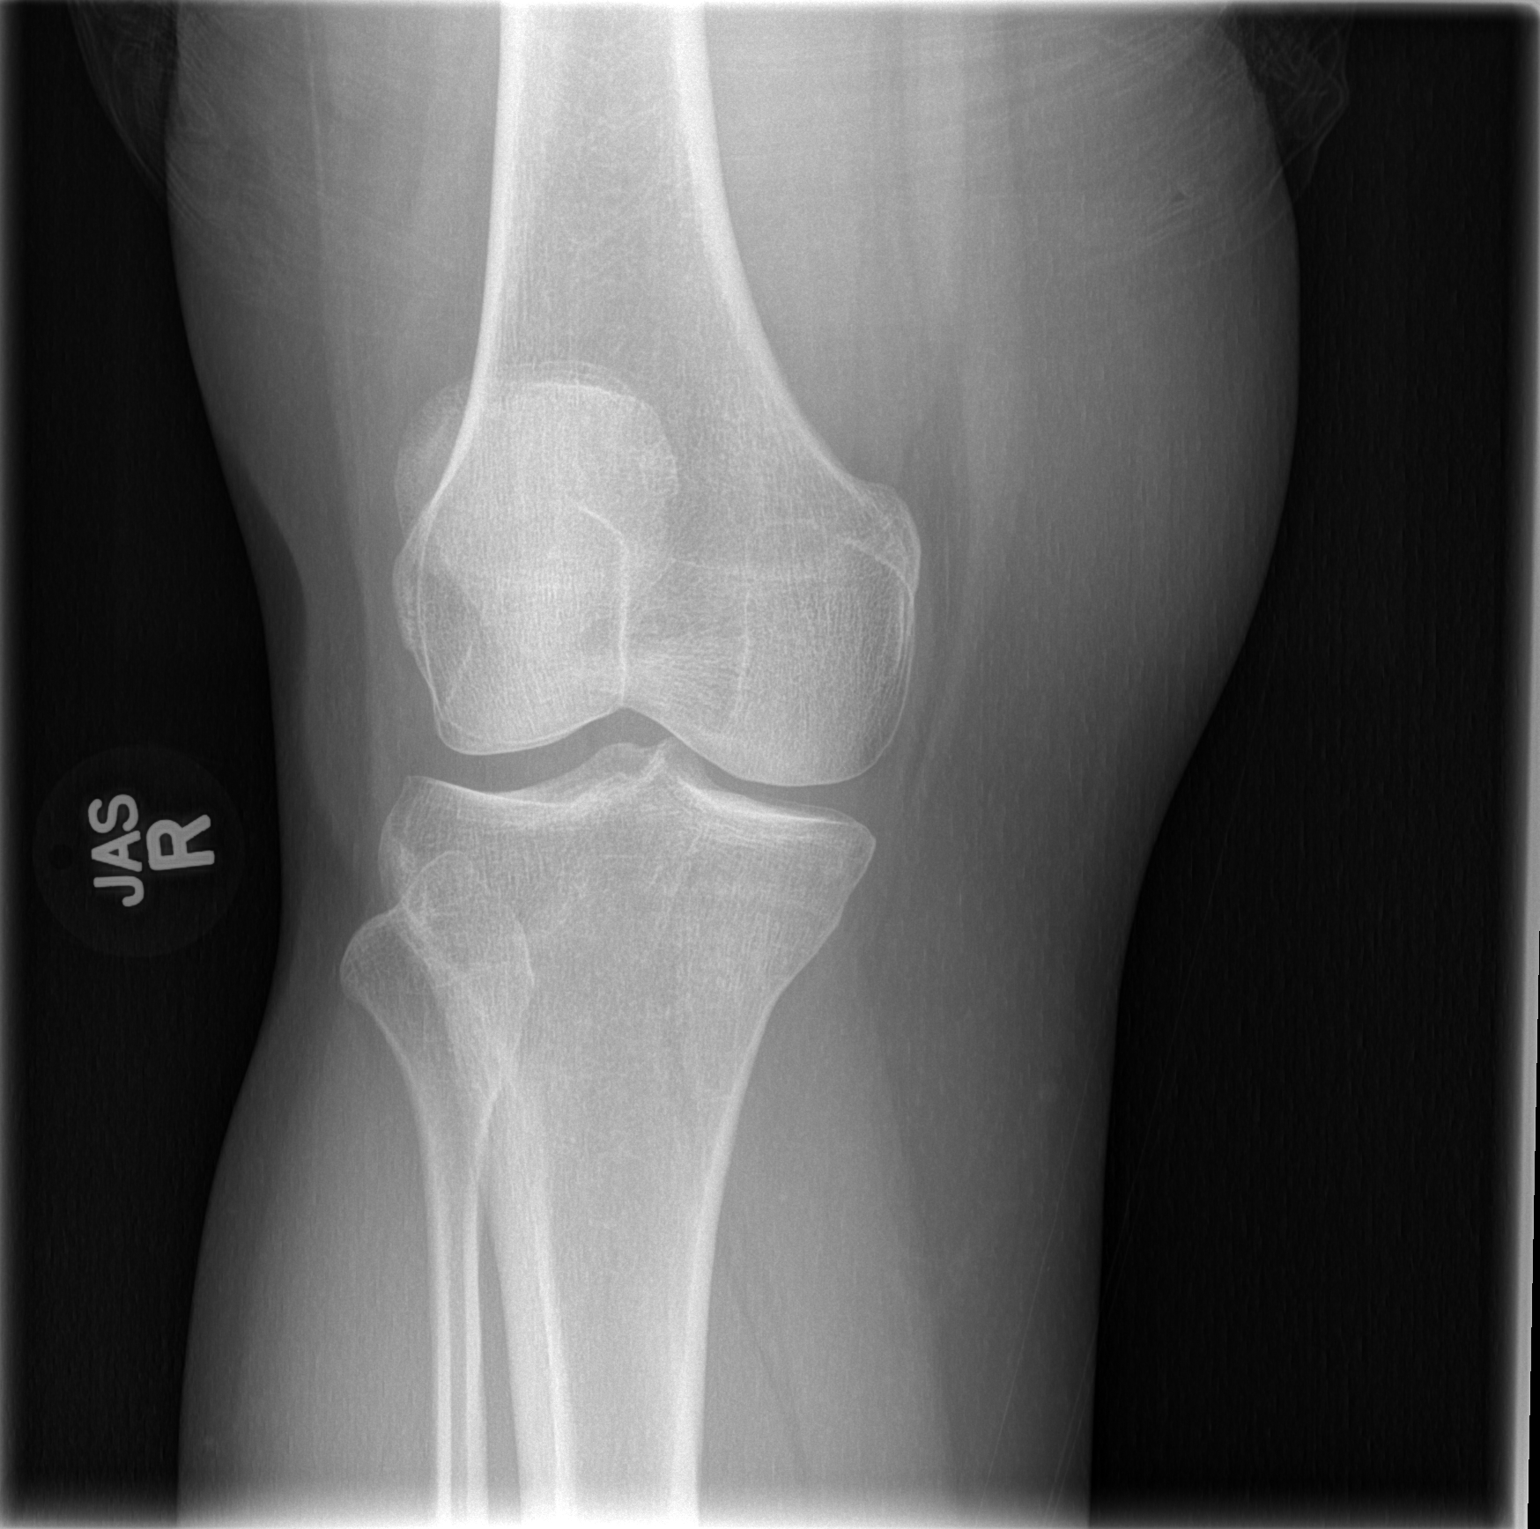

[t knee lat right]
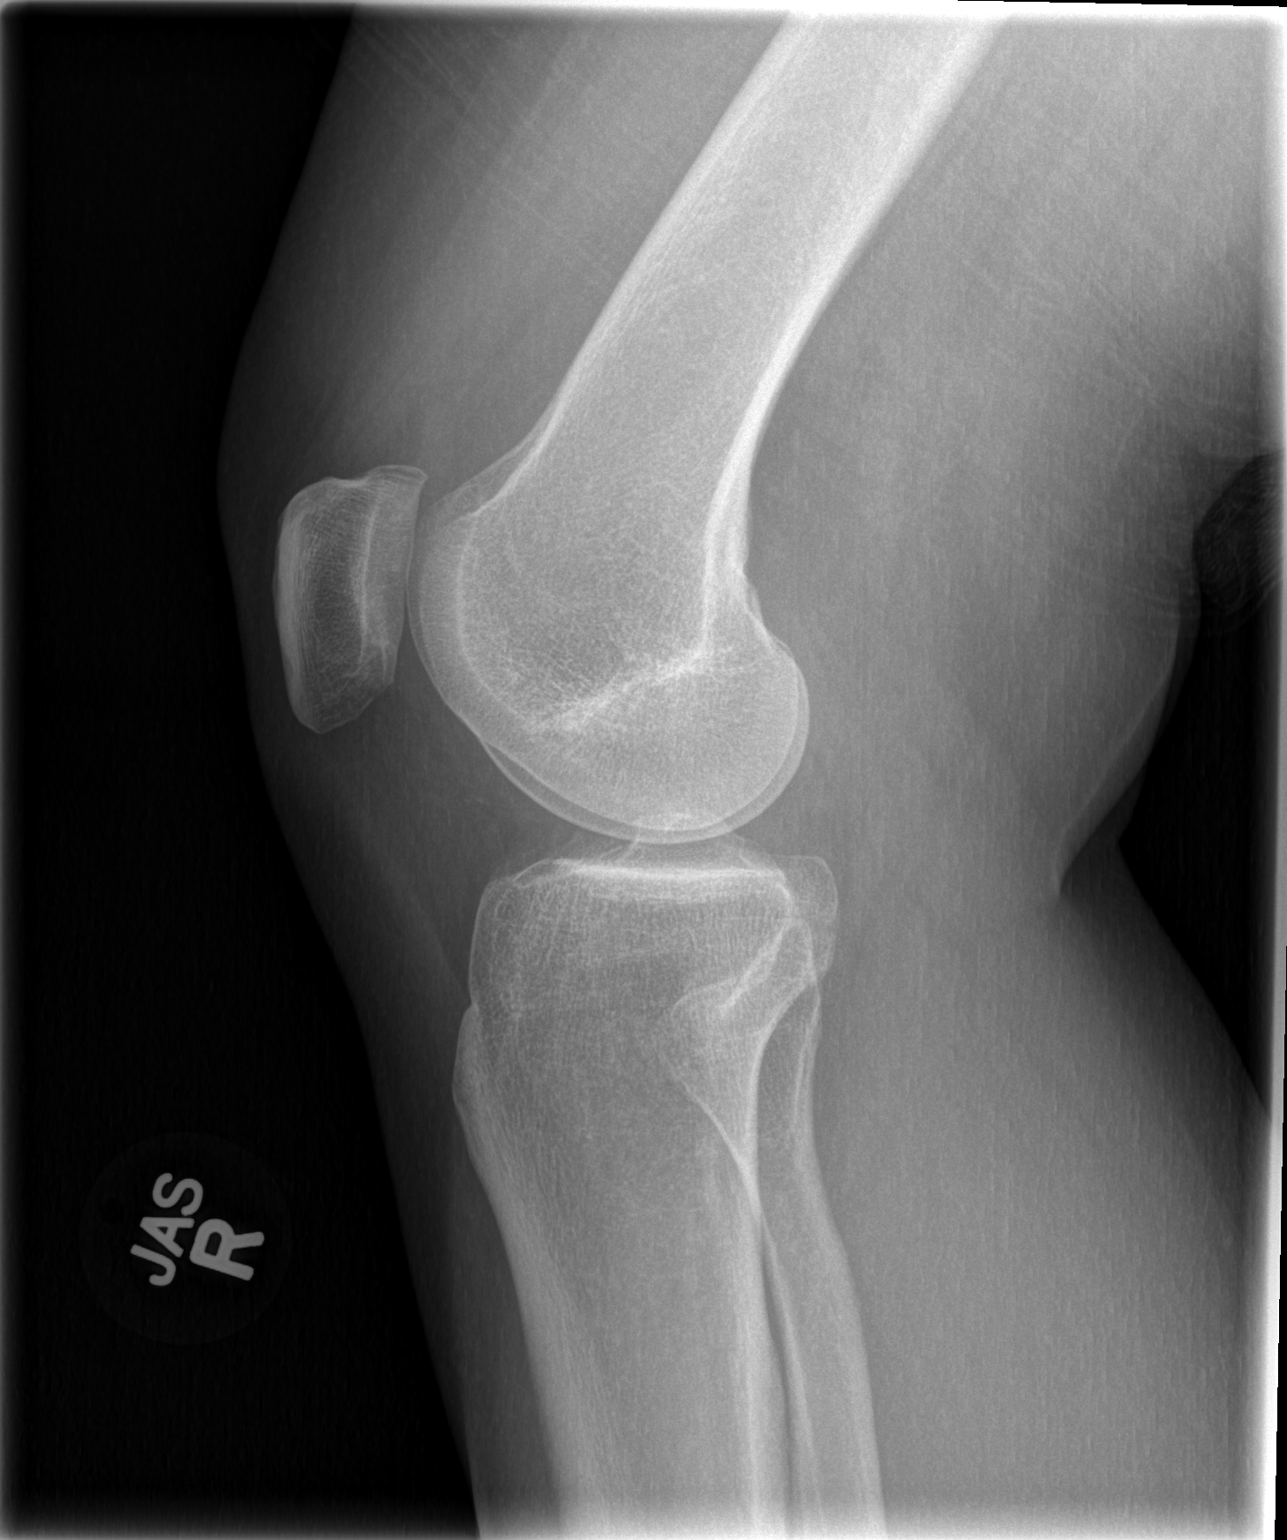

[2 of 2 positions shown; findings below may reference images not displayed]

FINDINGS: No fracture or dislocation.  No soft tissue abnormality.
No radiopaque foreign body.
IMPRESSION: No acute osseous abnormality.

## 2013-11-15 ENCOUNTER — Encounter (HOSPITAL_COMMUNITY): Payer: Self-pay | Admitting: Emergency Medicine

## 2013-11-15 ENCOUNTER — Emergency Department (HOSPITAL_COMMUNITY)
Admission: EM | Admit: 2013-11-15 | Discharge: 2013-11-16 | Disposition: A | Discharge status: Home / Self Care | Payer: Self-pay | Attending: Emergency Medicine | Admitting: Emergency Medicine

## 2013-11-15 DIAGNOSIS — Z3202 Encounter for pregnancy test, result negative: Secondary | ICD-10-CM | POA: Insufficient documentation

## 2013-11-15 DIAGNOSIS — Z8659 Personal history of other mental and behavioral disorders: Secondary | ICD-10-CM | POA: Insufficient documentation

## 2013-11-15 DIAGNOSIS — S5010XA Contusion of unspecified forearm, initial encounter: Secondary | ICD-10-CM | POA: Insufficient documentation

## 2013-11-15 DIAGNOSIS — R58 Hemorrhage, not elsewhere classified: Secondary | ICD-10-CM

## 2013-11-15 DIAGNOSIS — G8929 Other chronic pain: Secondary | ICD-10-CM | POA: Insufficient documentation

## 2013-11-15 DIAGNOSIS — S40019A Contusion of unspecified shoulder, initial encounter: Secondary | ICD-10-CM | POA: Insufficient documentation
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
## 2013-11-15 NOTE — ED Notes (Signed)
No loc

## 2013-11-16 ENCOUNTER — Emergency Department (HOSPITAL_COMMUNITY): Payer: Self-pay

## 2013-11-16 ENCOUNTER — Encounter (HOSPITAL_COMMUNITY): Payer: Self-pay | Admitting: Emergency Medicine

## 2013-11-16 LAB — POC URINE PREG, ED: PREG TEST UR: NEGATIVE

## 2013-11-16 MED ORDER — LORAZEPAM 1 MG PO TABS
1.0000 mg | ORAL_TABLET | Freq: Once | ORAL | Status: AC
  Administered 2013-11-16: 1 mg via ORAL
  Filled 2013-11-16: qty 1

## 2013-11-16 MED ORDER — OXYCODONE-ACETAMINOPHEN 5-325 MG PO TABS
1.0000 | ORAL_TABLET | Freq: Once | ORAL | Status: AC
  Administered 2013-11-16: 1 via ORAL
  Filled 2013-11-16: qty 1

## 2013-11-16 MED ORDER — OXYCODONE-ACETAMINOPHEN 5-325 MG PO TABS: 1.0000 | ORAL_TABLET | Freq: Four times a day (QID) | ORAL | Status: DC | PRN

## 2013-11-16 MED ORDER — ACETAMINOPHEN 325 MG PO TABS
650.0000 mg | ORAL_TABLET | Freq: Once | ORAL | Status: AC
  Administered 2013-11-16: 650 mg via ORAL
  Filled 2013-11-16: qty 2
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
## 2013-11-16 NOTE — Progress Notes (Signed)
CSW walked patient out to lobby. Patient stated that she was attempting to get a friend to come pick her up.   Samuella BruinKristin Khloei Spiker, MSW, LCSWA Clinical Social Worker [facility] Emergency Dept. [phone]
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
## 2013-12-11 ENCOUNTER — Encounter (HOSPITAL_COMMUNITY): Payer: Self-pay | Admitting: Emergency Medicine

## 2013-12-11 ENCOUNTER — Emergency Department (HOSPITAL_COMMUNITY)
Admission: EM | Admit: 2013-12-11 | Discharge: 2013-12-12 | Disposition: A | Discharge status: Other Acute Inpatient Hospital | Payer: Self-pay | Attending: Emergency Medicine | Admitting: Emergency Medicine

## 2013-12-11 DIAGNOSIS — G8929 Other chronic pain: Secondary | ICD-10-CM | POA: Insufficient documentation

## 2013-12-11 DIAGNOSIS — F1010 Alcohol abuse, uncomplicated: Secondary | ICD-10-CM | POA: Insufficient documentation

## 2013-12-11 DIAGNOSIS — R45851 Suicidal ideations: Secondary | ICD-10-CM | POA: Insufficient documentation

## 2013-12-11 DIAGNOSIS — Z79899 Other long term (current) drug therapy: Secondary | ICD-10-CM | POA: Insufficient documentation

## 2013-12-11 DIAGNOSIS — F411 Generalized anxiety disorder: Secondary | ICD-10-CM | POA: Insufficient documentation

## 2013-12-11 HISTORY — DX: Post-traumatic stress disorder, unspecified: F43.10

## 2013-12-11 HISTORY — DX: Other specified behavioral and emotional disorders with onset usually occurring in childhood and adolescence: F98.8

## 2013-12-11 LAB — CBC
HEMATOCRIT: 39.2 % (ref 36.0–46.0)
HEMOGLOBIN: 13.3 g/dL (ref 12.0–15.0)
MCH: 31.1 pg (ref 26.0–34.0)
MCHC: 33.9 g/dL (ref 30.0–36.0)
MCV: 91.6 fL (ref 78.0–100.0)
Platelets: 359 10*3/uL (ref 150–400)
RBC: 4.28 MIL/uL (ref 3.87–5.11)
RDW: 13.2 % (ref 11.5–15.5)
WBC: 8.8 10*3/uL (ref 4.0–10.5)

## 2013-12-11 LAB — POC URINE PREG, ED: PREG TEST UR: NEGATIVE

## 2013-12-11 MED ORDER — LORAZEPAM 1 MG PO TABS
0.0000 mg | ORAL_TABLET | Freq: Four times a day (QID) | ORAL | Status: DC
  Administered 2013-12-12 (×2): 2 mg via ORAL
  Filled 2013-12-11 (×2): qty 2

## 2013-12-11 MED ORDER — NICOTINE 21 MG/24HR TD PT24: 21.0000 mg | MEDICATED_PATCH | Freq: Every day | TRANSDERMAL | Status: DC

## 2013-12-11 MED ORDER — LORAZEPAM 1 MG PO TABS: 0.0000 mg | ORAL_TABLET | Freq: Two times a day (BID) | ORAL | Status: DC

## 2013-12-11 MED ORDER — ZOLPIDEM TARTRATE 5 MG PO TABS
5.0000 mg | ORAL_TABLET | Freq: Every evening | ORAL | Status: DC | PRN
  Administered 2013-12-12: 5 mg via ORAL
  Filled 2013-12-11: qty 1

## 2013-12-11 MED ORDER — IBUPROFEN 400 MG PO TABS: 600.0000 mg | ORAL_TABLET | Freq: Three times a day (TID) | ORAL | Status: DC | PRN

## 2013-12-11 MED ORDER — ONDANSETRON HCL 4 MG PO TABS: 4.0000 mg | ORAL_TABLET | Freq: Three times a day (TID) | ORAL | Status: DC | PRN

## 2013-12-11 NOTE — ED Notes (Signed)
House coverage and Charge aware of need for sitter.

## 2013-12-11 NOTE — ED Notes (Addendum)
Pt states SI thoughts pt states she has drank 4 "beers" this afternoon and took 3 xanaxs as well. Pt has a history of substance abuse and alcohol abuse. Pt states SI thoughts for awhile. Pt went to her AA meeting this afternoon and discussed thoughts. Pt friend brought her in for further treatment and detox. [facility] [phone] to determine what to do and was told she had to detox first then she could been [facility].
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
## 2013-12-11 NOTE — ED Notes (Signed)
MD at bedside. 

## 2013-12-12 ENCOUNTER — Encounter (HOSPITAL_COMMUNITY): Payer: Self-pay | Admitting: *Deleted

## 2013-12-12 ENCOUNTER — Inpatient Hospital Stay (HOSPITAL_COMMUNITY)
Admission: AD | Admit: 2013-12-12 | Discharge: 2013-12-16 | DRG: 897 | Disposition: A | Discharge status: Home / Self Care | Payer: No Typology Code available for payment source | Source: Intra-hospital | Attending: Psychiatry | Admitting: Psychiatry

## 2013-12-12 DIAGNOSIS — F419 Anxiety disorder, unspecified: Secondary | ICD-10-CM

## 2013-12-12 DIAGNOSIS — F332 Major depressive disorder, recurrent severe without psychotic features: Secondary | ICD-10-CM | POA: Diagnosis present

## 2013-12-12 DIAGNOSIS — F411 Generalized anxiety disorder: Secondary | ICD-10-CM | POA: Diagnosis present

## 2013-12-12 DIAGNOSIS — F1020 Alcohol dependence, uncomplicated: Principal | ICD-10-CM | POA: Diagnosis present

## 2013-12-12 DIAGNOSIS — G4700 Insomnia, unspecified: Secondary | ICD-10-CM | POA: Diagnosis present

## 2013-12-12 DIAGNOSIS — F1024 Alcohol dependence with alcohol-induced mood disorder: Secondary | ICD-10-CM

## 2013-12-12 DIAGNOSIS — F410 Panic disorder [episodic paroxysmal anxiety] without agoraphobia: Secondary | ICD-10-CM | POA: Diagnosis present

## 2013-12-12 DIAGNOSIS — F1010 Alcohol abuse, uncomplicated: Secondary | ICD-10-CM

## 2013-12-12 DIAGNOSIS — R45851 Suicidal ideations: Secondary | ICD-10-CM | POA: Diagnosis not present

## 2013-12-12 DIAGNOSIS — G8929 Other chronic pain: Secondary | ICD-10-CM | POA: Diagnosis present

## 2013-12-12 DIAGNOSIS — F909 Attention-deficit hyperactivity disorder, unspecified type: Secondary | ICD-10-CM | POA: Diagnosis present

## 2013-12-12 DIAGNOSIS — F4310 Post-traumatic stress disorder, unspecified: Secondary | ICD-10-CM | POA: Diagnosis present

## 2013-12-12 DIAGNOSIS — F321 Major depressive disorder, single episode, moderate: Secondary | ICD-10-CM

## 2013-12-12 DIAGNOSIS — F902 Attention-deficit hyperactivity disorder, combined type: Secondary | ICD-10-CM

## 2013-12-12 LAB — COMPREHENSIVE METABOLIC PANEL
ALT: 49 U/L — AB (ref 0–35)
ANION GAP: 20 — AB (ref 5–15)
AST: 55 U/L — ABNORMAL HIGH (ref 0–37)
Albumin: 3.7 g/dL (ref 3.5–5.2)
Alkaline Phosphatase: 126 U/L — ABNORMAL HIGH (ref 39–117)
BILIRUBIN TOTAL: 0.5 mg/dL (ref 0.3–1.2)
BUN: 6 mg/dL (ref 6–23)
CO2: 21 mEq/L (ref 19–32)
Calcium: 9.4 mg/dL (ref 8.4–10.5)
Chloride: 97 mEq/L (ref 96–112)
Creatinine, Ser: 0.57 mg/dL (ref 0.50–1.10)
GFR calc non Af Amer: >90 mL/min (ref >90)
GLUCOSE: 120 mg/dL — AB (ref 70–99)
POTASSIUM: 2.6 meq/L — AB (ref 3.7–5.3)
Sodium: 138 mEq/L (ref 137–147)
Total Protein: 8.2 g/dL (ref 6.0–8.3)

## 2013-12-12 LAB — ACETAMINOPHEN LEVEL: Acetaminophen (Tylenol), Serum: <15 ug/mL (ref 10–30)

## 2013-12-12 LAB — RAPID URINE DRUG SCREEN, HOSP PERFORMED
AMPHETAMINES: NOT DETECTED
BARBITURATES: NOT DETECTED
Benzodiazepines: POSITIVE — AB
Cocaine: NOT DETECTED
Opiates: NOT DETECTED
Tetrahydrocannabinol: NOT DETECTED

## 2013-12-12 LAB — POTASSIUM: Potassium: 3.8 mEq/L (ref 3.7–5.3)

## 2013-12-12 LAB — SALICYLATE LEVEL: Salicylate Lvl: <2 mg/dL — ABNORMAL LOW (ref 2.8–20.0)

## 2013-12-12 LAB — ETHANOL: Alcohol, Ethyl (B): 96 mg/dL — ABNORMAL HIGH (ref 0–11)

## 2013-12-12 MED ORDER — ALUM & MAG HYDROXIDE-SIMETH 200-200-20 MG/5ML PO SUSP: 30.0000 mL | ORAL | Status: DC | PRN

## 2013-12-12 MED ORDER — POTASSIUM CHLORIDE CRYS ER 20 MEQ PO TBCR
20.0000 meq | EXTENDED_RELEASE_TABLET | Freq: Two times a day (BID) | ORAL | Status: AC
  Administered 2013-12-12 – 2013-12-14 (×4): 20 meq via ORAL
  Filled 2013-12-12 (×7): qty 1

## 2013-12-12 MED ORDER — POTASSIUM CHLORIDE 10 MEQ/100ML IV SOLN
10.0000 meq | INTRAVENOUS | Status: AC
  Administered 2013-12-12 (×2): 10 meq via INTRAVENOUS
  Filled 2013-12-12: qty 100

## 2013-12-12 MED ORDER — THIAMINE HCL 100 MG/ML IJ SOLN
100.0000 mg | Freq: Once | INTRAMUSCULAR | Status: AC
  Administered 2013-12-12: 100 mg via INTRAMUSCULAR
  Filled 2013-12-12: qty 2

## 2013-12-12 MED ORDER — GABAPENTIN 100 MG PO CAPS
100.0000 mg | ORAL_CAPSULE | Freq: Three times a day (TID) | ORAL | Status: DC
  Filled 2013-12-12 (×4): qty 1

## 2013-12-12 MED ORDER — ONDANSETRON 4 MG PO TBDP: 4.0000 mg | ORAL_TABLET | Freq: Four times a day (QID) | ORAL | Status: AC | PRN

## 2013-12-12 MED ORDER — CHLORDIAZEPOXIDE HCL 25 MG PO CAPS
25.0000 mg | ORAL_CAPSULE | ORAL | Status: DC
Start: 2013-12-14 — End: 2013-12-12

## 2013-12-12 MED ORDER — TRAZODONE HCL 50 MG PO TABS: 50.0000 mg | ORAL_TABLET | Freq: Every evening | ORAL | Status: DC | PRN

## 2013-12-12 MED ORDER — ADULT MULTIVITAMIN W/MINERALS CH
1.0000 | ORAL_TABLET | Freq: Every day | ORAL | Status: DC
  Administered 2013-12-12 – 2013-12-16 (×5): 1 via ORAL
  Filled 2013-12-12 (×8): qty 1

## 2013-12-12 MED ORDER — CHLORDIAZEPOXIDE HCL 25 MG PO CAPS
25.0000 mg | ORAL_CAPSULE | Freq: Four times a day (QID) | ORAL | Status: DC
Start: 2013-12-12 — End: 2013-12-12
  Administered 2013-12-12: 25 mg via ORAL
  Filled 2013-12-12: qty 1

## 2013-12-12 MED ORDER — LOPERAMIDE HCL 2 MG PO CAPS: 2.0000 mg | ORAL_CAPSULE | ORAL | Status: AC | PRN

## 2013-12-12 MED ORDER — TRAZODONE HCL 50 MG PO TABS
50.0000 mg | ORAL_TABLET | Freq: Every evening | ORAL | Status: DC | PRN
  Administered 2013-12-12: 50 mg via ORAL
  Filled 2013-12-12: qty 1

## 2013-12-12 MED ORDER — ACETAMINOPHEN 325 MG PO TABS
650.0000 mg | ORAL_TABLET | Freq: Four times a day (QID) | ORAL | Status: DC | PRN
  Administered 2013-12-12 – 2013-12-16 (×6): 650 mg via ORAL
  Filled 2013-12-12 (×6): qty 2

## 2013-12-12 MED ORDER — CHLORDIAZEPOXIDE HCL 25 MG PO CAPS: 25.0000 mg | ORAL_CAPSULE | Freq: Every day | ORAL | Status: DC

## 2013-12-12 MED ORDER — CHLORDIAZEPOXIDE HCL 25 MG PO CAPS
25.0000 mg | ORAL_CAPSULE | Freq: Once | ORAL | Status: AC
  Administered 2013-12-12: 25 mg via ORAL
  Filled 2013-12-12: qty 1

## 2013-12-12 MED ORDER — SODIUM CHLORIDE 0.9 % IV SOLN
Freq: Once | INTRAVENOUS | Status: AC
  Administered 2013-12-12: 03:00:00 via INTRAVENOUS

## 2013-12-12 MED ORDER — POTASSIUM CHLORIDE CRYS ER 20 MEQ PO TBCR
40.0000 meq | EXTENDED_RELEASE_TABLET | Freq: Once | ORAL | Status: AC
  Administered 2013-12-12: 40 meq via ORAL
  Filled 2013-12-12: qty 2

## 2013-12-12 MED ORDER — VITAMIN B-1 100 MG PO TABS
100.0000 mg | ORAL_TABLET | Freq: Every day | ORAL | Status: DC
  Administered 2013-12-13 – 2013-12-16 (×4): 100 mg via ORAL
  Filled 2013-12-12 (×6): qty 1

## 2013-12-12 MED ORDER — CHLORDIAZEPOXIDE HCL 25 MG PO CAPS
25.0000 mg | ORAL_CAPSULE | Freq: Four times a day (QID) | ORAL | Status: AC | PRN
Start: 2013-12-12 — End: 2013-12-15
  Administered 2013-12-12 – 2013-12-14 (×6): 25 mg via ORAL
  Filled 2013-12-12 (×6): qty 1

## 2013-12-12 MED ORDER — LORAZEPAM 2 MG/ML IJ SOLN: 1.0000 mg | Freq: Once | INTRAMUSCULAR | Status: DC

## 2013-12-12 MED ORDER — CHLORDIAZEPOXIDE HCL 25 MG PO CAPS
25.0000 mg | ORAL_CAPSULE | Freq: Three times a day (TID) | ORAL | Status: DC
Start: 2013-12-13 — End: 2013-12-12

## 2013-12-12 MED ORDER — TRAZODONE HCL 50 MG PO TABS
50.0000 mg | ORAL_TABLET | Freq: Every evening | ORAL | Status: DC | PRN
  Administered 2013-12-13 – 2013-12-15 (×5): 50 mg via ORAL
  Filled 2013-12-12 (×3): qty 1
  Filled 2013-12-12: qty 28
  Filled 2013-12-12 (×2): qty 1

## 2013-12-12 MED ORDER — NICOTINE 21 MG/24HR TD PT24
21.0000 mg | MEDICATED_PATCH | Freq: Every day | TRANSDERMAL | Status: DC
  Filled 2013-12-12 (×7): qty 1

## 2013-12-12 MED ORDER — MAGNESIUM HYDROXIDE 400 MG/5ML PO SUSP: 30.0000 mL | Freq: Every day | ORAL | Status: DC | PRN

## 2013-12-12 MED ORDER — CHLORDIAZEPOXIDE HCL 25 MG PO CAPS: 25.0000 mg | ORAL_CAPSULE | Freq: Four times a day (QID) | ORAL | Status: DC | PRN
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
## 2013-12-12 NOTE — Progress Notes (Signed)
[facility] Community Liaison at [place], covering [place]: ° °Will send patient information on [facility] Orange Card program and a list of primary care resources to help patient establish primary care.  °

## 2013-12-12 NOTE — ED Provider Notes (Signed)
PROGRESS NOTE                                                                                                                 This is a sign-out from PA Tomasa BlaseSchultz at shift change: Heidi Pearson is a 38 y.o. female presenting with suicidal ideation. Patient has been accepted into behavioral health but potassium is low. Patient was repleted orally and via IV. [facility] K. is greater than 3.1. Plan is to followup recheck potassium.  Please refer to previous note for full HPI, ROS, PMH and PE.   Recheck K. is 3.8. [facility]  Patient is medically cleared for psychiatric psychiatric treatment.       Heidi Emeryicole Hildur Bayer, PA-C 12/12/13 [phone]
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
## 2013-12-12 NOTE — Progress Notes (Signed)
Nutrition Brief Note  Malnutrition Screening Tool result is inaccurate.  Please consult if nutrition needs are identified.  Cadyn Rodger MS, RD, LDN [phone] Pager [phone] Weekend/After Hours Pager  

## 2013-12-12 NOTE — ED Notes (Signed)
Reported K+ level of 2.6 to Dondra SpryGail, NP.

## 2013-12-12 NOTE — ED Provider Notes (Signed)
7:51 AM [facility] Dr. Dub MikesLugo. I was not directyl involved in pt care or dispo planning.   1. Suicidal ideation      Shanna CiscoMegan E Docherty, MD 12/12/13 [phone]

## 2013-12-12 NOTE — Progress Notes (Addendum)
Patient ID: Heidi KoyanagiKourtney A Pearson, female   DOB: 07-Feb-1976, 38 y.o.   MRN: [id number] Patient is anxious and agitated.  She was seen by Dr. Tawni CarnesSaranga and immediately came from his office stating, "I want to go home and I want to go home now!"  Patient discussed her home medications with Dr. And nurse and her main concern is her klonopin.  Patient came in for alcohol detox and is now saying that she is not here for alcohol abuse.  Explained to patient about 72-hour discharge and she still could not understand why she could not leave.  Patient keeps stating, "librium does nothing for me!  I have to have my klonopin.  What am I supposed to do about my anxiety attacks?"  Patient kept interrupting nurse when information was given.  She then stated, "you know what.  I'm just going to have a friend bring my pills up here."  Explained to patient that would not happen as it would be contraband.  She stated, "I know everybody does it here."  She requested to be moved to the 500 hall.  She requested a new doctor.  She became upset during her admission this morning because Dr. Dub MikesLugo was not here.  [facility].  AC, [name], is currently speaking with patient.

## 2013-12-12 NOTE — BHH Group Notes (Signed)
Adult Psychoeducational Group Note  Date:  12/12/2013 Time:  10:32 PM  Group Topic/Focus:  AA Meeting  Participation Level:  Minimal  Participation Quality:  Attentive  Affect:  Appropriate  Cognitive:  Appropriate  Insight: Good  Engagement in Group:  Limited  Modes of Intervention:  Discussion and Education  Additional Comments:  Heidi Pearson attended group.  She introduced herself and explained her situation.  Heidi Pearson, Heidi Pearson 12/12/2013, 10:32 PM

## 2013-12-12 NOTE — ED Notes (Signed)
K= 2.6 called in by lab.

## 2013-12-12 NOTE — BH Assessment (Signed)
Called to request TA equipment be set up.   Spoke with Sharen HonesGail Schultz, NP who reports pt is medically cleared and wants to be admitted due to SI.   TA to commence.   Clista BernhardtNancy Deslyn Cavenaugh, [facility] Triage Specialist 12/12/2013 12:30 AM
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
## 2013-12-12 NOTE — Progress Notes (Signed)
Pt expressed passive SI and feelings of anxiety at beginning of shift. This RN spoke with patient and patient verbally contracted for safety. At 10:25pm, patient came to nurse and stated that she accidentally shut her right hand in the door. The middle finger and ring finger have slight swelling. No bruising noted. Pt states that she doesn't have full range of motion but this RN saw her open and close her hand without any problem. Cold packs given and tylenol given for pain. Provider on call notified. Pt will be reassessed in the morning. Will continue to monitor.

## 2013-12-12 NOTE — ED Provider Notes (Signed)
  Medical screening examination/treatment/procedure(s) were performed by non-physician practitioner and as supervising physician I was immediately available for consultation/collaboration.   EKG Interpretation None         Gerhard Munchobert Adrienne Delay, MD 12/12/13 [id number]

## 2013-12-12 NOTE — ED Provider Notes (Signed)
Medical screening examination/treatment/procedure(s) were performed by non-physician practitioner and as supervising physician I was immediately available for consultation/collaboration.   EKG Interpretation None        Fathima Bartl S Shaquan Missey, MD 12/12/13 1628 

## 2013-12-12 NOTE — ED Notes (Signed)
Dondra SpryGail, NP paged.

## 2013-12-12 NOTE — ED Notes (Signed)
Called [facility] for transport to [facility]  

## 2013-12-12 NOTE — ED Notes (Signed)
Called [facility] again. They will be on the way shortly.

## 2013-12-12 NOTE — BH Assessment (Signed)
Relayed results of TA to Alberteen SamFran Hobson, NP. Per Drenda FreezeFran, NP pt meets inpt criteria but [facility] until her potassium level is at least a 3.2.  Renae FicklePaul, RN will alert TTS when potassium level has reached 3.2 or higher.   Earley FavorGail Schulz, NP is in agreement with inpt tx, but does not feel potassium level should disqualify [facility]. She reports it will likely take 6-8 hours for level to rise to 3.2 or above.   Per Delorise Jacksonori, [facility] pt once potassium level is where it needs to be.   Clista BernhardtNancy Brookelin Felber, [facility] Triage Specialist 12/12/2013 1:12 AM
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
## 2013-12-13 DIAGNOSIS — F1020 Alcohol dependence, uncomplicated: Principal | ICD-10-CM

## 2013-12-13 MED ORDER — DOXEPIN HCL 10 MG PO CAPS
10.0000 mg | ORAL_CAPSULE | Freq: Two times a day (BID) | ORAL | Status: DC
  Administered 2013-12-13 – 2013-12-14 (×3): 10 mg via ORAL
  Filled 2013-12-13 (×3): qty 1

## 2013-12-13 MED ORDER — GABAPENTIN 400 MG PO CAPS
400.0000 mg | ORAL_CAPSULE | Freq: Three times a day (TID) | ORAL | Status: DC
  Administered 2013-12-13 – 2013-12-16 (×10): 400 mg via ORAL
  Filled 2013-12-13 (×2): qty 56
  Filled 2013-12-13 (×3): qty 1
  Filled 2013-12-13: qty 56
  Filled 2013-12-13 (×4): qty 1
  Filled 2013-12-13 (×3): qty 56
  Filled 2013-12-13 (×3): qty 1

## 2013-12-13 NOTE — [facility] Group Notes (Signed)
[facility] Group Notes:  (Nursing/MHT/Case Management/Adjunct)  Date:  12/13/2013  Time:  0900 am  Type of Therapy:  Psychoeducational Skills  Participation Level:  Active  Participation Quality:  Monopolizing  Affect:  Excited  Cognitive:  Alert  Insight:  Lacking  Engagement in Group:  Monopolizing  Modes of Intervention:  Support  Summary of Progress/Problems: Patient is very concerned over seeing the doctor.  She remains upset over not getting klonopin.  she addressed the group, "I've stayed here before.  I was never on this hall.  I know I have been drinking.  This is the worst stay I have ever had."  Patient has poor insight. Heidi Pearson, Heidi Pearson   12/13/2013, 9:53 AM

## 2013-12-13 NOTE — [facility] Group Notes (Signed)
[facility] Group Notes:  (Nursing/MHT/Case Management/Adjunct)  Date:  12/13/2013  Time:  4:03 PM  Type of Therapy:  Nurse Education  Participation Level:  Did Not Attend  Participation Quality:  did not attend  Affect:  did not attend  Cognitive:  pt did not attend  Insight:  None  Engagement in Group:  did not attend   Modes of Intervention:  did not attend  Summary of Progress/Problems:  Jule SerKent, Trevian Hayashida Gail 12/13/2013, 4:03 PM

## 2013-12-13 NOTE — Progress Notes (Signed)
Patient seen lying in bed with eyes closed. Breathing even and unlabored. Face and hands visible. Will continue to monitor patient.

## 2013-12-13 NOTE — Progress Notes (Signed)
[facility] Group Notes:  (Nursing/MHT/Case Management/Adjunct)  Date:  12/13/2013  Time:  5:13 PM  Type of Therapy:  Psychoeducational Skills  Participation Level:  Active  Participation Quality:  Appropriate and Attentive  Affect:  Appropriate  Cognitive:  Appropriate  Insight:  Appropriate  Engagement in Group:  Engaged and Supportive  Modes of Intervention:  Activity  Summary of Progress/Problems: Pt was in and out of group. Pts played a game of Pictionary using coping skills.  Evadean Sproule C 12/13/2013, 5:13 PM 

## 2013-12-13 NOTE — Progress Notes (Signed)
[facility] MD Progress Note  12/13/2013 2:34 PM Heidi Pearson  MRN:  [id number]  Subjective:  Heidi Pearson reports, "I have massive anxiety. I need my Klonopin and Adderal or else I will stay in bed. The last time that I was here, I was treated well. I was prescribed my Klonopin and Adderall.  I left here and moved to [place]. I moved back to [place] 2 months ago. I have not been on my medicines in 2 months. I have been drinking a lot. That is why I did not try to take those medicines. I came here to be put back on my Adderall and Klonopin. Where is Dr. Sabra Heck?  Diagnosis:   DSM5: Schizophrenia Disorders:  NA Obsessive-Compulsive Disorders:  NA Trauma-Stressor Disorders:  Posttraumatic Stress Disorder (309.81) Substance/Addictive Disorders:  Alcohol Related Disorder - Severe (303.90) Depressive Disorders:  Major Depressive Disorder - Moderate (296.22) Total Time spent with patient: 35 minutes  Axis I: Alcohol dwependence Axis II: Deferred Axis III:  Past Medical History  Diagnosis Date  . Anxiety   . Chronic knee pain   . Depression   . Anxiety   . PTSD (post-traumatic stress disorder)   . ADD (attention deficit disorder)    Axis IV: other psychosocial or environmental problems and Alcoholism, chronic Axis V: 41-50 serious symptoms  ADL's:  Intact  Sleep: Good  Appetite:  Fair  Suicidal Ideation:  Plan:  Denies Intent:  Denies Means:  Denies Homicidal Ideation:  Plan:  Denies Intent:  Denies Means:  Denies AEB (as evidenced by):  Psychiatric Specialty Exam: Physical Exam  Review of Systems  Psychiatric/Behavioral: Positive for substance abuse (Alcohol dependence). Negative for depression, suicidal ideas, hallucinations and memory loss. The patient is nervous/anxious and has insomnia.     Blood pressure 128/98, pulse 85, temperature 97.5 F (36.4 C), temperature source Oral, resp. rate 18, height $RemoveBe'5\' 3"'DJxBidlNk$  (1.6 m), weight 95.255 kg (210 lb), last menstrual period 10/26/2013.Body mass  index is 37.21 kg/(m^2).  General Appearance: Casual  Eye Contact::  Fair  Speech:  Clear and Coherent  Volume:  Normal  Mood:  Depressed, Irritable and angry  Affect:  Restricted  Thought Process:  Coherent  Orientation:  Full (Time, Place, and Person)  Thought Content:  Rumination  Suicidal Thoughts:  No  Homicidal Thoughts:  No  Memory:  Immediate;   Good Recent;   Good Remote;   Good  Judgement:  Fair  Insight:  Lacking  Psychomotor Activity:  Restlessness and irritable  Concentration:  Poor  Recall:  Good  Fund of Knowledge:Poor  Language: Fair  Akathisia:  No  Handed:  Right  AIMS (if indicated):     Assets:  Desire for Improvement  Sleep:  Number of Hours: 6.5   Musculoskeletal: Strength & Muscle Tone: within normal limits Gait & Station: normal Patient leans: N/A  Current Medications: Current Facility-Administered Medications  Medication Dose Route Frequency Provider Last Rate Last Dose  . acetaminophen (TYLENOL) tablet 650 mg  650 mg Oral Q6H PRN Encarnacion Slates, NP   650 mg at 12/13/13 1426  . alum & mag hydroxide-simeth (MAALOX/MYLANTA) 200-200-20 MG/5ML suspension 30 mL  30 mL Oral Q4H PRN Encarnacion Slates, NP      . chlordiazePOXIDE (LIBRIUM) capsule 25 mg  25 mg Oral Q6H PRN Encarnacion Slates, NP   25 mg at 12/13/13 1428  . loperamide (IMODIUM) capsule 2-4 mg  2-4 mg Oral PRN Encarnacion Slates, NP      . magnesium  hydroxide (MILK OF MAGNESIA) suspension 30 mL  30 mL Oral Daily PRN Encarnacion Slates, NP      . multivitamin with minerals tablet 1 tablet  1 tablet Oral Daily Encarnacion Slates, NP   1 tablet at 12/13/13 0808  . nicotine (NICODERM CQ - dosed in mg/24 hours) patch 21 mg  21 mg Transdermal Q0600 Encarnacion Slates, NP      . ondansetron (ZOFRAN-ODT) disintegrating tablet 4 mg  4 mg Oral Q6H PRN Encarnacion Slates, NP      . ondansetron (ZOFRAN-ODT) disintegrating tablet 4 mg  4 mg Oral Q6H PRN Encarnacion Slates, NP      . potassium chloride SA (K-DUR,KLOR-CON) CR tablet 20 mEq  20  mEq Oral BID Encarnacion Slates, NP   20 mEq at 12/13/13 0808  . thiamine (VITAMIN B-1) tablet 100 mg  100 mg Oral Daily Encarnacion Slates, NP   100 mg at 12/13/13 0808  . traZODone (DESYREL) tablet 50 mg  50 mg Oral QHS PRN,MR X 1 Evanna Glenda Chroman, NP        Lab Results:  Results for orders placed during the hospital encounter of 12/11/13 (from the past 48 hour(s))  CBC     Status: None   Collection Time    12/11/13 11:22 PM      Result Value Ref Range   WBC 8.8  4.0 - 10.5 K/uL   RBC 4.28  3.87 - 5.11 MIL/uL   Hemoglobin 13.3  12.0 - 15.0 g/dL   HCT 39.2  36.0 - 46.0 %   MCV 91.6  78.0 - 100.0 fL   MCH 31.1  26.0 - 34.0 pg   MCHC 33.9  30.0 - 36.0 g/dL   RDW 13.2  11.5 - 15.5 %   Platelets 359  150 - 400 K/uL  COMPREHENSIVE METABOLIC PANEL     Status: Abnormal   Collection Time    12/11/13 11:22 PM      Result Value Ref Range   Sodium 138  137 - 147 mEq/L   Potassium 2.6 (*) 3.7 - 5.3 mEq/L   Comment: CRITICAL RESULT CALLED TO, READ BACK BY AND VERIFIED WITH:     WHITE,N RN 12/12/2013 0023 JORDANS     REPEATED TO VERIFY   Chloride 97  96 - 112 mEq/L   CO2 21  19 - 32 mEq/L   Glucose, Bld 120 (*) 70 - 99 mg/dL   BUN 6  6 - 23 mg/dL   Creatinine, Ser 0.57  0.50 - 1.10 mg/dL   Calcium 9.4  8.4 - 10.5 mg/dL   Total Protein 8.2  6.0 - 8.3 g/dL   Albumin 3.7  3.5 - 5.2 g/dL   AST 55 (*) 0 - 37 U/L   ALT 49 (*) 0 - 35 U/L   Alkaline Phosphatase 126 (*) 39 - 117 U/L   Total Bilirubin 0.5  0.3 - 1.2 mg/dL   GFR calc non Af Amer >90  >90 mL/min   GFR calc Af Amer >90  >90 mL/min   Comment: (NOTE)     The eGFR has been calculated using the CKD EPI equation.     This calculation has not been validated in all clinical situations.     eGFR's persistently <90 mL/min signify possible Chronic Kidney     Disease.   Anion gap 20 (*) 5 - 15  ETHANOL     Status: Abnormal   Collection Time  12/11/13 11:22 PM      Result Value Ref Range   Alcohol, Ethyl (B) 96 (*) 0 - 11 mg/dL   Comment:             LOWEST DETECTABLE LIMIT FOR     SERUM ALCOHOL IS 11 mg/dL     FOR MEDICAL PURPOSES ONLY  ACETAMINOPHEN LEVEL     Status: None   Collection Time    12/11/13 11:22 PM      Result Value Ref Range   Acetaminophen (Tylenol), Serum <15.0  10 - 30 ug/mL   Comment:            THERAPEUTIC CONCENTRATIONS VARY     SIGNIFICANTLY. A RANGE OF 10-30     ug/mL MAY BE AN EFFECTIVE     CONCENTRATION FOR MANY PATIENTS.     HOWEVER, SOME ARE BEST TREATED     AT CONCENTRATIONS OUTSIDE THIS     RANGE.     ACETAMINOPHEN CONCENTRATIONS     >150 ug/mL AT 4 HOURS AFTER     INGESTION AND >50 ug/mL AT 12     HOURS AFTER INGESTION ARE     OFTEN ASSOCIATED WITH TOXIC     REACTIONS.  SALICYLATE LEVEL     Status: Abnormal   Collection Time    12/11/13 11:22 PM      Result Value Ref Range   Salicylate Lvl <6.6 (*) 2.8 - 20.0 mg/dL  URINE RAPID DRUG SCREEN (HOSP PERFORMED)     Status: Abnormal   Collection Time    12/11/13 11:26 PM      Result Value Ref Range   Opiates NONE DETECTED  NONE DETECTED   Cocaine NONE DETECTED  NONE DETECTED   Benzodiazepines POSITIVE (*) NONE DETECTED   Amphetamines NONE DETECTED  NONE DETECTED   Tetrahydrocannabinol NONE DETECTED  NONE DETECTED   Barbiturates NONE DETECTED  NONE DETECTED   Comment:            DRUG SCREEN FOR MEDICAL PURPOSES     ONLY.  IF CONFIRMATION IS NEEDED     FOR ANY PURPOSE, NOTIFY LAB     WITHIN 5 DAYS.                LOWEST DETECTABLE LIMITS     FOR URINE DRUG SCREEN     Drug Class       Cutoff (ng/mL)     Amphetamine      1000     Barbiturate      200     Benzodiazepine   063     Tricyclics       016     Opiates          300     Cocaine          300     THC              50  POC URINE PREG, ED     Status: None   Collection Time    12/11/13 11:28 PM      Result Value Ref Range   Preg Test, Ur NEGATIVE  NEGATIVE   Comment:            THE SENSITIVITY OF THIS     METHODOLOGY IS >24 mIU/mL  POTASSIUM     Status: None    Collection Time    12/12/13  6:50 AM      Result Value Ref Range   Potassium 3.8  3.7 - 5.3 mEq/L    Physical Findings: AIMS: Facial and Oral Movements Muscles of Facial Expression: None, normal Lips and Perioral Area: None, normal Jaw: None, normal Tongue: None, normal,Extremity Movements Upper (arms, wrists, hands, fingers): None, normal Lower (legs, knees, ankles, toes): None, normal, Trunk Movements Neck, shoulders, hips: None, normal, Overall Severity Severity of abnormal movements (highest score from questions above): None, normal Incapacitation due to abnormal movements: None, normal Patient's awareness of abnormal movements (rate only patient's report): No Awareness, Dental Status Current problems with teeth and/or dentures?: No Does patient usually wear dentures?: No  CIWA:  CIWA-Ar Total: 6 COWS:     Treatment Plan Summary: Daily contact with patient to assess and evaluate symptoms and progress in treatment Medication management  Plan: Supportive approach, relapse prevention.  2. Continue detox protocol  3. Continue crisis management and stabilization, refusing to take recommended treatment regimen for anxiety.  4. Medication management to reduce current symptoms to base line and improve patient's overall level of functioning  5. Treat health problems as indicated; ensure supplement bid. 6. Encourage activity participation.  Medical Decision Making Problem Points:  Established problem, stable/improving (1), Review of last therapy session (1) and Review of psycho-social stressors (1) Data Points:  Review of medication regiment & side effects (2) Review of new medications or change in dosage (2)  I certify that inpatient services furnished can reasonably be expected to improve the patient's condition.   Lindell Spar I, PMHNp-BC 12/13/2013, 2:34 PM

## 2013-12-13 NOTE — [facility] Group Notes (Deleted)
[facility] Group Notes:  (Nursing/MHT/Case Management/Adjunct)  Date:  12/13/2013  Time:  0900 am  Type of Therapy:  Psychoeducational Skills  Participation Level:  Active  Participation Quality:  Monopolizing  Affect:  Excited  Cognitive:  Alert  Insight:  Lacking  Engagement in Group:  Monopolizing  Modes of Intervention:  Support  Summary of Progress/Problems: Patient is excited about discharge.  States, "I feel so much better than when I came in."  Patient likes to use lots of curse words and it appears to be intentional.  She has poor insight regarding her addiction.  Cranford MonBeaudry, Lowella Kindley Evans 12/13/2013, 9:45 AM

## 2013-12-13 NOTE — Progress Notes (Signed)
Patient ID: Heidi Pearson, female   DOB: Mar 15, 1976, 38 y.o.   MRN: [id number] D: Patient is anxious and tearful.  She is focused on receiving her klonopin for her anxiety attacks.  She mood is inconsistent with staff.  She appears anxious and then is in the day room laughing with her peers.  She attended group and informed that group of her "bad experience" with the doctor yesterday.  She continues to vent her frustration and has requested discharge.  Patient signed a 72-hour discharge form yesterday.  Patient asked also why she could not be placed on ativan.  She was informed she is taking librium with any withdrawal symptoms she may have.  She expressed her desire to move to the 500 hall, as she feels she does not belong on the 300 hall.  She has poor insight regarding her alcohol and benzo use.  She denies any SI/HI/AVH.   A: Continue to monitor medication management and MD orders.  Safety checks completed every 15 minutes as needed.   R: Patient needs redirection at times.  Her behavior has been appropriate.

## 2013-12-13 NOTE — Progress Notes (Signed)
D: Patient denies SI/HI and A/V hallucinations; patient had a lot of complaints about anxiety; patient denies all withdrawal symptoms  A: Monitored q 15 minutes; patient encouraged to attend groups; patient educated about medications; patient given medications per physician orders; patient encouraged to express feelings and/or concerns  R: Patient now reports that the added medication has helped; patient has calmed down on the unit and engaging with peers

## 2013-12-13 NOTE — [facility] Group Notes (Signed)
[facility] Group Notes:  (Clinical Social Work)  12/13/2013     10-11AM  Summary of Progress/Problems:   The main focus of today's process group was for the patient to identify ways in which they have in the past sabotaged their own recovery. Motivational Interviewing and a worksheet were utilized to help patients explore in depth the perceived benefits and costs of their substance use, as well as the potential benefits and costs of stopping.  The Stages of Change were explained using a handout, with an emphasis on making plans to deal with sabotaging behaviors proactively.  The patient expressed that her self-sabotaging behavior has cost her a great deal.  She stated that in her 6120s she was married, in a nice house, and stated she is ashamed to admit that back then she looked down on people who are like she is now.  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  She advocated for herself programming on [address] and was discontent with CSW's response as to why that is not possible due to the need to treat both sides of the dual diagnosis.  She continued to complain about the doctor she saw yesterday, and to insist she needs the anxiety medication she feels works for her.  Type of Therapy:  Group Therapy - Process   Participation Level:  Active  Participation Quality:  Resistant and Sharing  Affect:  Irritable  Cognitive:  Oriented  Insight:  Developing/Improving  Engagement in Therapy:  Developing/Improving and Engaged  Modes of Intervention:  Education, Support and Processing, Motivational Interviewing  Pilgrim's PrideMareida Grossman-Orr, LCSW 12/13/2013, 11:10 AM

## 2013-12-14 MED ORDER — DOXEPIN HCL 10 MG PO CAPS
10.0000 mg | ORAL_CAPSULE | Freq: Every day | ORAL | Status: DC
  Administered 2013-12-15: 10 mg via ORAL
  Filled 2013-12-14: qty 1
  Filled 2013-12-14: qty 14
  Filled 2013-12-14: qty 1

## 2013-12-14 NOTE — Progress Notes (Signed)
Patient ID: Rica KoyanagiKourtney A Pixler, female   DOB: [birth year], 38 y.o.   MRN: [id number] Informed Renata Capriceonrad, PA and [name], [facility] that patient has been very sedated today stating, "I'm high. I feel like I smoked a big joint."  Patient did take doxipin this morning, however, she already appeared sedated.  She refused it, then changed her mind and took it.  Patient's behavior has changed since yesterday.  Her eyes are glazed over and she is slow to react.  PA and AC was informed due to patient making a comment, "I'll just have my friend bring my pills in."  Staff was advised to continue to watch patient and report any suspicious behaviors.

## 2013-12-14 NOTE — [facility] Group Notes (Signed)
[facility] Group Notes:  (Nursing/MHT/Case Management/Adjunct)  Date:  12/14/2013  Time: 0900 am  Type of Therapy:  Psychoeducational Skills  Participation Level:  Active  Participation Quality:  Intrusive  Affect:  Defensive  Cognitive:  Alert and oriented  Insight:  Lacking  Engagement in Group:  Monopolizing  Modes of Intervention:  Clarification  Summary of Progress/Problems: Patient focused on getting klonopin.  Became upset when nurse informed her we do not usually d/c patients on narcotics.  Patient has belief that librium is not a benzodiazepine.  Informed patient klonopin is in same class as librium.    Cranford MonBeaudry, Lain Tetterton Evans 12/14/2013, 10:51 AM

## 2013-12-14 NOTE — [facility] Group Notes (Signed)
[facility] Group Notes:  (Nursing/MHT/Case Management/Adjunct)  Date:  12/14/2013  Time:  4:13 PM  Type of Therapy:  Nurse Education  Participation Level:  Did Not Attend  Participation Quality:  did not attend  Affect:  did not attend  Cognitive:  did not attend  Insight:  None  Engagement in Group:  did not attend  Modes of Intervention:  did not attend  Summary of Progress/Problems:pt started off in group but left before the video started.  Heidi Pearson, Heidi Pearson 12/14/2013, 4:13 PM

## 2013-12-14 NOTE — Progress Notes (Signed)
Patient ID: Heidi Pearson, female   DOB: April 18, 1976, 38 y.o.   MRN: [id number] [facility] MD Progress Note  12/14/2013 12:24 PM ERAN WINDISH  MRN:  [id number]  Subjective:  [name] reports, "I took the Doxepin 10 mg, it made me very drowsy. I don't believe that it will work for me during the day time. I can use at night for sleep because I sleep well last night. I'm still feeling very anxious and agitated. Klonopin is the only thing that will help my anxiety. I hope I get discharged in the morning to get to see my new doctor for my Klonopin".  Diagnosis:   DSM5: Schizophrenia Disorders:  NA Obsessive-Compulsive Disorders:  NA Trauma-Stressor Disorders:  Posttraumatic Stress Disorder (309.81) Substance/Addictive Disorders:  Alcohol Related Disorder - Severe (303.90) Depressive Disorders:  Major Depressive Disorder - Moderate (296.22) Total Time spent with patient: 35 minutes  Axis I: Alcohol dwependence Axis II: Deferred Axis III:  Past Medical History  Diagnosis Date  . Anxiety   . Chronic knee pain   . Depression   . Anxiety   . PTSD (post-traumatic stress disorder)   . ADD (attention deficit disorder)    Axis IV: other psychosocial or environmental problems and Alcoholism, chronic Axis V: 41-50 serious symptoms  ADL's:  Intact  Sleep: Good  Appetite:  Fair  Suicidal Ideation:  Plan:  Denies Intent:  Denies Means:  Denies Homicidal Ideation:  Plan:  Denies Intent:  Denies Means:  Denies AEB (as evidenced by):  Psychiatric Specialty Exam: Physical Exam  Review of Systems  Psychiatric/Behavioral: Positive for substance abuse (Alcohol dependence). Negative for depression, suicidal ideas, hallucinations and memory loss. The patient is nervous/anxious and has insomnia.     Blood pressure 136/74, pulse 89, temperature 97.8 F (36.6 C), temperature source Oral, resp. rate 20, height 5\' 3"  (1.6 m), weight 95.255 kg (210 lb), last menstrual period 10/26/2013.Body mass  index is 37.21 kg/(m^2).  General Appearance: Casual  Eye Contact::  Fair  Speech:  Clear and Coherent  Volume:  Normal  Mood:  Depressed, Irritable and angry  Affect:  Restricted  Thought Process:  Coherent  Orientation:  Full (Time, Place, and Person)  Thought Content:  Rumination  Suicidal Thoughts:  No  Homicidal Thoughts:  No  Memory:  Immediate;   Good Recent;   Good Remote;   Good  Judgement:  Fair  Insight:  Lacking  Psychomotor Activity:  Restlessness and irritable  Concentration:  Poor  Recall:  Good  Fund of Knowledge:Poor  Language: Fair  Akathisia:  No  Handed:  Right  AIMS (if indicated):     Assets:  Desire for Improvement  Sleep:  Number of Hours: 5.5   Musculoskeletal: Strength & Muscle Tone: within normal limits Gait & Station: normal Patient leans: N/A  Current Medications: Current Facility-Administered Medications  Medication Dose Route Frequency Provider Last Rate Last Dose  . acetaminophen (TYLENOL) tablet 650 mg  650 mg Oral Q6H PRN Sanjuana Kava, NP   650 mg at 12/14/13 1123  . alum & mag hydroxide-simeth (MAALOX/MYLANTA) 200-200-20 MG/5ML suspension 30 mL  30 mL Oral Q4H PRN Sanjuana Kava, NP      . chlordiazePOXIDE (LIBRIUM) capsule 25 mg  25 mg Oral Q6H PRN Sanjuana Kava, NP   25 mg at 12/14/13 7846  . doxepin (SINEQUAN) capsule 10 mg  10 mg Oral BID Sanjuana Kava, NP   10 mg at 12/14/13 0802  . gabapentin (NEURONTIN) capsule 400  mg  400 mg Oral TID Sanjuana KavaAgnes I Nwoko, NP   400 mg at 12/14/13 1122  . loperamide (IMODIUM) capsule 2-4 mg  2-4 mg Oral PRN Sanjuana KavaAgnes I Nwoko, NP      . magnesium hydroxide (MILK OF MAGNESIA) suspension 30 mL  30 mL Oral Daily PRN Sanjuana KavaAgnes I Nwoko, NP      . multivitamin with minerals tablet 1 tablet  1 tablet Oral Daily Sanjuana KavaAgnes I Nwoko, NP   1 tablet at 12/14/13 0802  . nicotine (NICODERM CQ - dosed in mg/24 hours) patch 21 mg  21 mg Transdermal Q0600 Sanjuana KavaAgnes I Nwoko, NP      . ondansetron (ZOFRAN-ODT) disintegrating tablet 4 mg  4  mg Oral Q6H PRN Sanjuana KavaAgnes I Nwoko, NP      . ondansetron (ZOFRAN-ODT) disintegrating tablet 4 mg  4 mg Oral Q6H PRN Sanjuana KavaAgnes I Nwoko, NP      . potassium chloride SA (K-DUR,KLOR-CON) CR tablet 20 mEq  20 mEq Oral BID Sanjuana KavaAgnes I Nwoko, NP   20 mEq at 12/14/13 0802  . thiamine (VITAMIN B-1) tablet 100 mg  100 mg Oral Daily Sanjuana KavaAgnes I Nwoko, NP   100 mg at 12/14/13 0802  . traZODone (DESYREL) tablet 50 mg  50 mg Oral QHS PRN,MR X 1 Evanna Cori Merry ProudBurkett, NP   50 mg at 12/14/13 0012    Lab Results:  No results found for this or any previous visit (from the past 48 hour(s)).  Physical Findings: AIMS: Facial and Oral Movements Muscles of Facial Expression: None, normal Lips and Perioral Area: None, normal Jaw: None, normal Tongue: None, normal,Extremity Movements Upper (arms, wrists, hands, fingers): None, normal Lower (legs, knees, ankles, toes): None, normal, Trunk Movements Neck, shoulders, hips: None, normal, Overall Severity Severity of abnormal movements (highest score from questions above): None, normal Incapacitation due to abnormal movements: None, normal Patient's awareness of abnormal movements (rate only patient's report): No Awareness, Dental Status Current problems with teeth and/or dentures?: No Does patient usually wear dentures?: No  CIWA:  CIWA-Ar Total: 0 COWS:     Treatment Plan Summary: Daily contact with patient to assess and evaluate symptoms and progress in treatment Medication management  Plan: Supportive approach, relapse prevention.  2. Continue detox protocol  3. Continue crisis management and stabilization, refusing to take recommended treatment regimen for anxiety, change Doxepin dose to 10 mg at bedtime, continue Neurontin.  4. Medication management to reduce current symptoms to base line and improve patient's overall level of functioning  5. Treat health problems as indicated; ensure supplement bid. 6. Encourage activity participation.  Medical Decision  Making Problem Points:  Established problem, stable/improving (1), Review of last therapy session (1) and Review of psycho-social stressors (1) Data Points:  Review of medication regiment & side effects (2) Review of new medications or change in dosage (2)  I certify that inpatient services furnished can reasonably be expected to improve the patient's condition.   Armandina Stammerwoko, Agnes I, PMHNp-BC 12/14/2013, 12:24 PM  Patient seen, evaluated and I agree with notes by Nurse Practitioner. Thedore MinsMojeed Faatima Tench, MD

## 2013-12-14 NOTE — [facility] Group Notes (Signed)
[facility] Group Notes:  (Clinical Social Work)  12/14/2013  10:00-11:00AM  Summary of Progress/Problems:   The main focus of today's process group was to   identify the patient's current support system and decide on other supports that can be put in place.  The picture on workbook was used to discuss why additional supports are needed.  An emphasis was placed on using counselor, doctor, therapy groups, 12-step groups, and problem-specific support groups to expand supports.   There was also an extensive discussion about what constitutes a healthy support versus an unhealthy support.  The patient fell asleep early in the group, and remained asleep for the remainder of group.  Type of Therapy:  Process Group with Motivational Interviewing  Participation Level:  None  Participation Quality:  Drowsy  Affect:  Blunted  Cognitive:  Oriented  Insight:  None  Engagement in Therapy:  None  Modes of Intervention:   Education, Support and Processing, Activity  Pilgrim's PrideMareida Grossman-Orr, LCSW 12/14/2013, 12:15pm

## 2013-12-14 NOTE — Progress Notes (Signed)
[facility] Group Notes:  (Nursing/MHT/Case Management/Adjunct)  Date:  12/14/2013  Time:  5:03 PM  Type of Therapy:  Psychoeducational Skills  Participation Level:  Active  Participation Quality:  Appropriate and Attentive  Affect:  Appropriate  Cognitive:  Appropriate  Insight:  Appropriate  Engagement in Group:  Engaged  Modes of Intervention:  Activity  Summary of Progress/Problems: Pts played an activity using the Therapeutic Ball. Pts threw the ball around and answered questions about their social life and changes they would like to make while here in the hospital.  Serafina Topham C 12/14/2013, 5:03 PM 
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
## 2013-12-14 NOTE — Progress Notes (Signed)
Patient ID: Heidi Pearson, female   DOB: 23-May-1975, 38 y.o.   MRN: [id number] D: Patient is sedated, drowsy this morning.  During group, she stated, "I am high.  I feel like I have just smoked the world's biggest joint."  Counseled patient about her doxipin and sedation.  NP was notified and dosage will be adjusted.  Patient also received a librium prn this morning at 0600.  She continues to complain about her anxiety.  She states, "I don't what I'm going to do if I don't get my medication."  During group, patient became defensive with staff about classes of drugs.  Patient stated that Librium was not a narcotic and "does nothing for me.  I only take it because it's the only thing I have."  Patient continues to be focused on her klonopin.  Patient has been observed interacting with her peers, jovial and smiling.  When she comes to the medication window, she presents an anxious, tearful affect. She denies any SI/HI/AVH today.  Her appetite and sleep are fair, energy and concentration low.  She rates her depression as a 4; hopelessness as an 8; anxiety as an 8.  Her main goal is "to get meds right."   A: Continue to monitor medication management and MD orders.  Safety checks completed every 15 minutes. R: patient's behavior has been appropriate.

## 2013-12-14 NOTE — Progress Notes (Addendum)
D: Pt presents with labile affect and mood.  Pt denies SI/HI.  Denies AH/VH.  Pt reports she did not have a goal for the day because "I was so tired from the Doxepin I took this morning.  I just slept most of the day."  Pt expressed concern regarding medications stating "I need to make sure I have my anxiety under control because I get really nasty and mean when I'm anxious and I'm not on the right medications.  I don't want to go back to drinking when I'm anxious because that's what I was doing."  Pt asked why she did not get Doxepin at HS. A: Medications administered per order.  Pt educated on medication schedule.  Encouragement and support offered.  Safety maintained.  Positive coping skills encouraged.  R: Pt was intrusive with peers and staff, limits set when pt was trying to discuss peers' medications and when pt tried to stay at medication window while peer was asking about their medications.  Pt was argumentative with staff at times.  Attempted to staff split.   Attended evening group.  Pt is in no acute distress.  Will continue to monitor for safety.

## 2013-12-14 NOTE — Progress Notes (Signed)
Patient did attend the evening speaker AA meeting.  

## 2013-12-15 DIAGNOSIS — F410 Panic disorder [episodic paroxysmal anxiety] without agoraphobia: Secondary | ICD-10-CM

## 2013-12-15 DIAGNOSIS — F909 Attention-deficit hyperactivity disorder, unspecified type: Secondary | ICD-10-CM

## 2013-12-15 DIAGNOSIS — F411 Generalized anxiety disorder: Secondary | ICD-10-CM

## 2013-12-15 MED ORDER — HYDROXYZINE HCL 25 MG PO TABS: 25.0000 mg | ORAL_TABLET | Freq: Four times a day (QID) | ORAL | Status: DC | PRN

## 2013-12-15 MED ORDER — GABAPENTIN 400 MG PO CAPS: ORAL_CAPSULE | ORAL | Status: DC

## 2013-12-15 MED ORDER — DOXEPIN HCL 10 MG PO CAPS: 10.0000 mg | ORAL_CAPSULE | Freq: Every day | ORAL | Status: DC

## 2013-12-15 MED ORDER — BUSPIRONE HCL 15 MG PO TABS
7.5000 mg | ORAL_TABLET | Freq: Two times a day (BID) | ORAL | Status: DC
  Administered 2013-12-15 – 2013-12-16 (×3): 7.5 mg via ORAL
  Filled 2013-12-15 (×2): qty 2
  Filled 2013-12-15 (×2): qty 1
  Filled 2013-12-15 (×2): qty 14
  Filled 2013-12-15: qty 1
  Filled 2013-12-15: qty 2

## 2013-12-15 MED ORDER — BUPROPION HCL ER (XL) 150 MG PO TB24: 150.0000 mg | ORAL_TABLET | Freq: Every day | ORAL | Status: DC

## 2013-12-15 MED ORDER — HYDROXYZINE HCL 25 MG PO TABS
25.0000 mg | ORAL_TABLET | Freq: Four times a day (QID) | ORAL | Status: DC | PRN
  Administered 2013-12-15 – 2013-12-16 (×4): 25 mg via ORAL
  Filled 2013-12-15 (×4): qty 1

## 2013-12-15 MED ORDER — GABAPENTIN 400 MG PO CAPS
400.0000 mg | ORAL_CAPSULE | Freq: Three times a day (TID) | ORAL | Status: DC
Start: 2013-12-15 — End: 2013-12-15

## 2013-12-15 MED ORDER — GABAPENTIN 400 MG PO CAPS
400.0000 mg | ORAL_CAPSULE | Freq: Every day | ORAL | Status: DC
  Administered 2013-12-15: 400 mg via ORAL
  Filled 2013-12-15 (×3): qty 1

## 2013-12-15 MED ORDER — TRAZODONE HCL 50 MG PO TABS: 50.0000 mg | ORAL_TABLET | Freq: Every evening | ORAL | Status: DC | PRN

## 2013-12-15 MED ORDER — BUPROPION HCL ER (XL) 150 MG PO TB24
150.0000 mg | ORAL_TABLET | Freq: Every day | ORAL | Status: DC
  Administered 2013-12-15 – 2013-12-16 (×2): 150 mg via ORAL
  Filled 2013-12-15: qty 14
  Filled 2013-12-15: qty 1
  Filled 2013-12-15: qty 14
  Filled 2013-12-15 (×3): qty 1

## 2013-12-15 MED ORDER — BUSPIRONE HCL 7.5 MG PO TABS: 7.5000 mg | ORAL_TABLET | Freq: Two times a day (BID) | ORAL | Status: DC
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
## 2013-12-15 NOTE — [facility] Group Notes (Signed)
[facility]  12/15/2013 8:45 AM  Participation Quality: Alert, Appropriate and Oriented  Mood/Affect: flat affect; "okay"mood  Depression Rating: 0  Anxiety Rating: 8  Thoughts of Suicide: Pt denies SI/HI "at the moment"  Will you contract for safety? "Maybe"  Current AVH: Pt denies  Plan for Discharge/Comments: Pt attended discharge planning group and actively participated in group. CSW provided pt with today's workbook. Pt reports that she is anxious due to changing of her medication regimen.  Pt declines referral for inpatient services due to not wanting to lose her job. Pt reports that she refers to attend AA meetings and go [facility]    Transportation Means: Pt reports access to transportation; best friend to pick up at discharge  Supports: No supports mentioned at this time  Chad CordialLauren Carter, LCSWA 12/15/2013 10:14 AM

## 2013-12-15 NOTE — Progress Notes (Signed)
Adult Psychoeducational Group Note  Date:  12/15/2013 Time:  10:00 am  Group Topic/Focus:  Wellness Toolbox:   The focus of this group is to discuss various aspects of wellness, balancing those aspects and exploring ways to increase the ability to experience wellness.  Patients will create a wellness toolbox for use upon discharge.  Participation Level:  Did Not Attend   Modena NunneryJohnson, Lawrence Mitch 12/15/2013, 12:55 PM

## 2013-12-15 NOTE — BHH Suicide Risk Assessment (Signed)
[facility] INPATIENT:  Family/Significant Other Suicide Prevention Education  Suicide Prevention Education:  Patient Refusal for Family/Significant Other Suicide Prevention Education: The patient Heidi Pearson has refused to provide written consent for family/significant other to be provided Family/Significant Other Suicide Prevention Education during admission and/or prior to discharge.  Physician notified.  Elaina Hoopsarter, Beckham Capistran M 12/15/2013, 9:57 AM
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
## 2013-12-15 NOTE — Progress Notes (Signed)
Patient ID: Rica KoyanagiKourtney A Sparacino, female   DOB: Apr 18, 1976, 38 y.o.   MRN: [id number] D: Patient is questioning her 72 hour discharge.  She states that she is not ready to go stating, "my medications aren't right.  I can't go home like this or I will drink."  Patient is complaining about the neurontin "doing nothing for me."  Patient has been focused on getting klonopin since her admission.  Patient had previously states that she would have a friend bring her medications in.  She complains of poor sleep; fair appetite, low energy and poor concentration.  She denies any SI/HI/AVH.  Patient can be labile, irritable mood.  She has been intrusive with her peers and discussing medications.   A: monitor medication management and MD orders.  Safety checks completed every 15 minutes per protocol. R: Patient is up for d/c today.

## 2013-12-16 MED ORDER — DOXEPIN HCL 25 MG PO CAPS: 25.0000 mg | ORAL_CAPSULE | Freq: Every day | ORAL | Status: DC

## 2013-12-16 MED ORDER — BUSPIRONE HCL 15 MG PO TABS: 15.0000 mg | ORAL_TABLET | Freq: Two times a day (BID) | ORAL | Status: DC

## 2013-12-16 MED ORDER — DOXEPIN HCL 25 MG PO CAPS
25.0000 mg | ORAL_CAPSULE | Freq: Every day | ORAL | Status: DC
  Filled 2013-12-16: qty 14
  Filled 2013-12-16: qty 1

## 2013-12-16 MED ORDER — HYDROXYZINE HCL 25 MG PO TABS
25.0000 mg | ORAL_TABLET | Freq: Once | ORAL | Status: AC
  Administered 2013-12-16: 25 mg via ORAL
  Filled 2013-12-16 (×2): qty 1

## 2013-12-16 MED ORDER — BUSPIRONE HCL 15 MG PO TABS
15.0000 mg | ORAL_TABLET | Freq: Two times a day (BID) | ORAL | Status: DC
  Administered 2013-12-16: 15 mg via ORAL
  Filled 2013-12-16 (×2): qty 28
  Filled 2013-12-16 (×2): qty 1
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
## 2013-12-16 NOTE — Progress Notes (Signed)
Recreation Therapy Notes  Animal-Assisted Activity/Therapy (AAA/T) Program Checklist/Progress Notes Patient Eligibility Criteria Checklist & Daily Group note for Rec Tx Intervention  Date: 08.04.2015 Time: 2:45pm Location: [address] Dayroom   AAA/T Program Assumption of Risk Form signed by Patient/ or Parent Legal Guardian yes  Patient is free of allergies or sever asthma yes  Patient reports no fear of animals yes  Patient reports no history of cruelty to animals yes   Patient understands his/her participation is voluntary yes  Patient washes hands before animal contact yes  Patient washes hands after animal contact yes  Behavioral Response: Engaged, Appropriate   Education: Hand Washing, Appropriate Animal Interaction   Education Outcome: Acknowledges understanding  Clinical Observations/Feedback: Patient interacted appropriately with therapy dog team and peers in session. Patient asked appropriate questions about therapy dog and his training, as well as shared stories about pets she has had in the past.   Jearl Klinefelterenise L Abbegail Matuska, LRT/CTRS  Jearl KlinefelterBlanchfield, Tajanae Guilbault L 12/16/2013 4:03 PM

## 2013-12-16 NOTE — [facility] Group Notes (Addendum)
[facility] LCSW Group Therapy 12/16/2013 1:15 PM  Type of Therapy: Group Therapy  Participation Level: Active  Participation Quality: Attentive, Sharing and Supportive  Affect: Depressed and Flat  Cognitive: Alert and Oriented  Insight: Developing/Improving and Engaged  Engagement in Therapy: Developing/Improving and Engaged Modes of Intervention: Clarification, Confrontation, Discussion, Education, Exploration, Limit-setting, Orientation, Problem-solving, Rapport Building, Reality Testing, Socialization and Support  Summary of Progress/Problems: Patient actively listened to [employer] guest speaker.   Maddux Vanscyoc, MSW, LCSWA Clinical Social Worker [facility] [phone]   

## 2013-12-16 NOTE — Progress Notes (Signed)
[facility] Discharge Plan :  Will you be returning to the same living situation after discharge: Yes,  patient plans to return to current living situation At discharge, do you have transportation home?:Yes,  patient reports that she will have a friend pick her up Do you have the ability to pay for your medications:Yes,  patient will be provided with necessary medication samples and prescriptions at discharge.   Release of information consent forms completed and in the chart;  Patient's signature needed at discharge.  Patient to Follow up at: Follow-up Information   [facility]. (This is a walk-in appointment.  You may walk-in anytime Monday-Friday, 8am-3pm for assessment for medication management and therapy. )    Specialty:  Behavioral Health   Contact information:   [address] [place] [address] [phone]       Patient denies SI/HI:   Yes,  denies    Safety Planning and Suicide Prevention discussed:  Yes,  with patient  Laya Letendre, West CarboKristin L 12/16/2013, 9:04 AM

## 2013-12-19 NOTE — Progress Notes (Signed)
Patient Discharge Instructions:  After Visit Summary (AVS):   Faxed to:  12/19/13 Discharge Summary Note:   Faxed to:  12/19/13 Psychiatric Admission Assessment Note:   Faxed to:  12/19/13 Suicide Risk Assessment - Discharge Assessment:   Faxed to:  12/19/13 Faxed/Sent to the Next Level Care provider:  12/19/13 [facility] @ [phone]  Jerelene ReddenSheena E Fitzgerald, 12/19/2013, 2:48 PM

## 2014-05-12 ENCOUNTER — Ambulatory Visit: Payer: Self-pay

## 2014-06-19 ENCOUNTER — Emergency Department (INDEPENDENT_AMBULATORY_CARE_PROVIDER_SITE_OTHER)
Admission: EM | Admit: 2014-06-19 | Discharge: 2014-06-19 | Disposition: A | Discharge status: Home / Self Care | Payer: Self-pay | Source: Home / Self Care | Attending: Family Medicine | Admitting: Family Medicine

## 2014-06-19 ENCOUNTER — Encounter (HOSPITAL_COMMUNITY): Payer: Self-pay

## 2014-06-19 DIAGNOSIS — M545 Low back pain, unspecified: Secondary | ICD-10-CM

## 2014-06-19 DIAGNOSIS — S8001XA Contusion of right knee, initial encounter: Secondary | ICD-10-CM

## 2014-06-19 DIAGNOSIS — S161XXA Strain of muscle, fascia and tendon at neck level, initial encounter: Secondary | ICD-10-CM

## 2014-06-19 MED ORDER — TRAMADOL HCL 50 MG PO TABS: 50.0000 mg | ORAL_TABLET | Freq: Four times a day (QID) | ORAL | Status: DC | PRN

## 2014-06-19 MED ORDER — DICLOFENAC SODIUM 1 % TD GEL: 1.0000 "application " | Freq: Four times a day (QID) | TRANSDERMAL | Status: DC
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
## 2014-07-12 ENCOUNTER — Emergency Department (HOSPITAL_COMMUNITY): Payer: Self-pay

## 2014-07-12 ENCOUNTER — Encounter (HOSPITAL_COMMUNITY): Payer: Self-pay

## 2014-07-12 ENCOUNTER — Emergency Department (HOSPITAL_COMMUNITY)
Admission: EM | Admit: 2014-07-12 | Discharge: 2014-07-12 | Disposition: A | Discharge status: Home / Self Care | Payer: Self-pay | Attending: Emergency Medicine | Admitting: Emergency Medicine

## 2014-07-12 DIAGNOSIS — R05 Cough: Secondary | ICD-10-CM | POA: Insufficient documentation

## 2014-07-12 DIAGNOSIS — F329 Major depressive disorder, single episode, unspecified: Secondary | ICD-10-CM | POA: Insufficient documentation

## 2014-07-12 DIAGNOSIS — F419 Anxiety disorder, unspecified: Secondary | ICD-10-CM | POA: Insufficient documentation

## 2014-07-12 DIAGNOSIS — H6501 Acute serous otitis media, right ear: Secondary | ICD-10-CM | POA: Insufficient documentation

## 2014-07-12 DIAGNOSIS — R059 Cough, unspecified: Secondary | ICD-10-CM

## 2014-07-12 DIAGNOSIS — Z791 Long term (current) use of non-steroidal anti-inflammatories (NSAID): Secondary | ICD-10-CM | POA: Insufficient documentation

## 2014-07-12 DIAGNOSIS — Z79899 Other long term (current) drug therapy: Secondary | ICD-10-CM | POA: Insufficient documentation

## 2014-07-12 DIAGNOSIS — G8929 Other chronic pain: Secondary | ICD-10-CM | POA: Insufficient documentation

## 2014-07-12 DIAGNOSIS — J029 Acute pharyngitis, unspecified: Secondary | ICD-10-CM | POA: Insufficient documentation

## 2014-07-12 DIAGNOSIS — Z7952 Long term (current) use of systemic steroids: Secondary | ICD-10-CM | POA: Insufficient documentation

## 2014-07-12 MED ORDER — OXYCODONE-ACETAMINOPHEN 5-325 MG PO TABS
1.0000 | ORAL_TABLET | Freq: Once | ORAL | Status: AC
  Administered 2014-07-12: 1 via ORAL
  Filled 2014-07-12: qty 1

## 2014-07-12 MED ORDER — IBUPROFEN 800 MG PO TABS
800.0000 mg | ORAL_TABLET | Freq: Once | ORAL | Status: AC
  Administered 2014-07-12: 800 mg via ORAL
  Filled 2014-07-12: qty 1

## 2014-07-12 MED ORDER — AMOXICILLIN-POT CLAVULANATE 875-125 MG PO TABS
1.0000 | ORAL_TABLET | Freq: Once | ORAL | Status: AC
  Administered 2014-07-12: 1 via ORAL
  Filled 2014-07-12: qty 1

## 2014-07-12 MED ORDER — PREDNISONE 10 MG PO TABS: 20.0000 mg | ORAL_TABLET | Freq: Two times a day (BID) | ORAL | Status: DC

## 2014-07-12 MED ORDER — HYDROCODONE-ACETAMINOPHEN 5-325 MG PO TABS: 1.0000 | ORAL_TABLET | Freq: Four times a day (QID) | ORAL | Status: DC | PRN

## 2014-07-12 MED ORDER — AMOXICILLIN 500 MG PO CAPS: 500.0000 mg | ORAL_CAPSULE | Freq: Three times a day (TID) | ORAL | Status: DC

## 2014-07-12 NOTE — ED Notes (Signed)
Pt c/o cough x 2 months and R ear pain x 1 day.  Pain score 6/10.  Pt reports taking OTC medication w/o relief.

## 2014-07-12 NOTE — ED Provider Notes (Signed)
CSN: [id number]     Arrival date & time 07/12/14  1347 History  This chart was scribed for non-physician practitioner Kerrie Buffalo, NP, working with Joya Gaskins, MD by Littie Deeds, ED Scribe. This patient was seen in room WTR7/WTR7 and the patient's care was started at 3:11 PM.      Chief Complaint  Patient presents with  . Cough  . Otalgia   Patient is a 39 y.o. female presenting with cough and ear pain. The history is provided by the patient. No language interpreter was used.  Cough Cough characteristics:  Barking Onset quality:  Gradual Progression:  Unchanged Chronicity:  New Smoker: no   Relieved by:  Nothing Worsened by:  Nothing tried Associated symptoms: chills, ear pain and sore throat   Ear pain:    Location:  Right   Severity:  Severe   Duration:  12 hours   Timing:  Constant   Chronicity:  New Otalgia Associated symptoms: cough and sore throat   Associated symptoms: no congestion     HPI Comments: Heidi Pearson is a 39 y.o. female who presents to the Emergency Department complaining of gradual onset barky dry cough that started a few months ago. Patient also reports having severe right ear pain that started last night around 3:00 AM and sore throat. She states that she coughs so hard sometimes that she feels like vomiting. She also notes that the pain gives her chills. Patient denies congestion. She also denies smoking. Patient notes allergies to codeine and morphine. She is able to tolerate percocet and has no known allergies to antibiotics.  No PCP per patient  Past Medical History  Diagnosis Date  . Anxiety   . Chronic knee pain   . Depression   . Anxiety   . PTSD (post-traumatic stress disorder)   . ADD (attention deficit disorder)    Past Surgical History  Procedure Laterality Date  . Cesarean section     History reviewed. No pertinent family history. History  Substance Use Topics  . Smoking status: Never Smoker   . Smokeless tobacco: Never  Used  . Alcohol Use: No   OB History    No data available     Review of Systems  Constitutional: Positive for chills.  HENT: Positive for ear pain and sore throat. Negative for congestion.   Respiratory: Positive for cough.   all other systems negative    Allergies  Codeine; Morphine and related; Tramadol; and Vistaril  Home Medications   Prior to Admission medications   Medication Sig Start Date End Date Taking? Authorizing Provider  acetaminophen (TYLENOL) 500 MG tablet Take 1,000 mg by mouth every 6 (six) hours as needed for mild pain.   Yes Historical Provider, MD  dextromethorphan-guaiFENesin (MUCINEX DM) 30-600 MG per 12 hr tablet Take 1 tablet by mouth 2 (two) times daily.   Yes Historical Provider, MD  diphenhydrAMINE (BENADRYL) 25 mg capsule Take 25 mg by mouth every 6 (six) hours as needed for allergies.   Yes Historical Provider, MD  guaiFENesin-dextromethorphan (ROBITUSSIN DM) 100-10 MG/5ML syrup Take 10 mLs by mouth every 4 (four) hours as needed for cough.   Yes Historical Provider, MD  ibuprofen (ADVIL,MOTRIN) 200 MG tablet Take 400 mg by mouth every 6 (six) hours as needed for mild pain.   Yes Historical Provider, MD  Phenyleph-Doxylamine-DM-APAP 5-6.25-10-325 MG CAPS Take 2 capsules by mouth at bedtime as needed (cold symptoms).   Yes Historical Provider, MD  amoxicillin (AMOXIL) 500 MG  capsule Take 1 capsule (500 mg total) by mouth 3 (three) times daily. 07/12/14   Dauntae Derusha Orlene OchM Evely Gainey, NP  buPROPion (WELLBUTRIN XL) 150 MG 24 hr tablet Take 1 tablet (150 mg total) by mouth daily. For depression 12/15/13   Sanjuana KavaAgnes I Nwoko, NP  busPIRone (BUSPAR) 15 MG tablet Take 1 tablet (15 mg total) by mouth 2 (two) times daily. For anxiety 12/16/13   Sanjuana KavaAgnes I Nwoko, NP  diclofenac sodium (VOLTAREN) 1 % GEL Apply 1 application topically 4 (four) times daily. 06/19/14   Hayden Rasmussenavid Mabe, NP  doxepin (SINEQUAN) 25 MG capsule Take 1 capsule (25 mg total) by mouth at bedtime. For depression/anxiety 12/16/13    Sanjuana KavaAgnes I Nwoko, NP  gabapentin (NEURONTIN) 400 MG capsule Take 1 capsul four times daily: For substance withdrawal syndrome 12/15/13   Sanjuana KavaAgnes I Nwoko, NP  HYDROcodone-acetaminophen (NORCO) 5-325 MG per tablet Take 1 tablet by mouth every 6 (six) hours as needed for moderate pain. 07/12/14   Tarique Loveall Orlene OchM Nylani Michetti, NP  hydrOXYzine (ATARAX/VISTARIL) 25 MG tablet Take 1 tablet (25 mg total) by mouth every 6 (six) hours as needed for anxiety. 12/15/13   Sanjuana KavaAgnes I Nwoko, NP  predniSONE (DELTASONE) 10 MG tablet Take 2 tablets (20 mg total) by mouth 2 (two) times daily with a meal. 07/12/14   Isael Stille Orlene OchM Mahsa Hanser, NP  traZODone (DESYREL) 50 MG tablet Take 1 tablet (50 mg total) by mouth at bedtime as needed and may repeat dose one time if needed for sleep. 12/15/13   Sanjuana KavaAgnes I Nwoko, NP   BP 126/84 mmHg  Pulse 89  Temp(Src) 97.9 F (36.6 C) (Oral)  Resp 16  SpO2 95%  LMP 07/06/2014 Physical Exam  Constitutional: She is oriented to person, place, and time. She appears well-developed and well-nourished.  HENT:  Head: Normocephalic and atraumatic.  Right Ear: Tympanic membrane is bulging.  Eyes: Conjunctivae and EOM are normal. Pupils are equal, round, and reactive to light.  Neck: Normal range of motion. Neck supple.  Cardiovascular: Normal rate and regular rhythm.   Pulmonary/Chest: Effort normal. No respiratory distress. She has no wheezes. She has no rales.  Abdominal: Soft. There is no tenderness.  Musculoskeletal: Normal range of motion.  Neurological: She is alert and oriented to person, place, and time. No cranial nerve deficit.  Skin: Skin is warm and dry.  Psychiatric: She has a normal mood and affect. Her behavior is normal.  Nursing note and vitals reviewed.   ED Course  Procedures  DIAGNOSTIC STUDIES: Oxygen Saturation is 96% on room air, adequate by my interpretation.    COORDINATION OF CARE: 3:15 PM-Discussed treatment plan which includes antibiotics, pain medication and CXR with pt at bedside and pt  agreed to plan.   Labs Review Labs Reviewed - No data to display  Imaging Review Dg Chest 2 View  07/12/2014   CLINICAL DATA:  Cough x2 months, right ear pain  EXAM: CHEST  2 VIEW  COMPARISON:  None.  FINDINGS: Lungs are clear.  No pleural effusion or pneumothorax.  The heart is normal in size.  Visualized osseous structures are within normal limits.  IMPRESSION: No evidence of acute cardiopulmonary disease.   Electronically Signed   By: Charline BillsSriyesh  Krishnan M.D.   On: 07/12/2014 16:00     MDM  39 y.o. female with cough x 2 months and ear pain x 1 day that is severe. Will treat for otitis media and cough. Stable for discharge without respiratory distress. O2 SAT 95% on R/A. I  have reviewed this patient's vital signs, nurses notes, appropriate imaging and discussed findings and plan of care with the patient and she voices understanding and agrees with plan. I stressed to the patient importance of follow up in a week to be sure the ear infection is improving. Patient agrees to follow up.      Final diagnoses:  Cough  Right acute serous otitis media, recurrence not specified   I personally performed the services described in this documentation, which was scribed in my presence. The recorded information has been reviewed and is accurate.    [place], NP 07/13/14 1724  Joya Gaskins, MD 07/13/14 [phone]

## 2014-07-12 NOTE — Discharge Instructions (Signed)
Cool Mist Vaporizers °Vaporizers may help relieve the symptoms of a cough and cold. They add moisture to the air, which helps mucus to become thinner and less sticky. This makes it easier to breathe and cough up secretions. Cool mist vaporizers do not cause serious burns like hot mist vaporizers, which may also be called steamers or humidifiers. Vaporizers have not been proven to help with colds. You should not use a vaporizer if you are allergic to mold. °HOME CARE INSTRUCTIONS °· Follow the package instructions for the vaporizer. °· Do not use anything other than distilled water in the vaporizer. °· Do not run the vaporizer all of the time. This can cause mold or bacteria to grow in the vaporizer. °· Clean the vaporizer after each time it is used. °· Clean and dry the vaporizer well before storing it. °· Stop using the vaporizer if worsening respiratory symptoms develop. °Document Released: 01/27/2004 Document Revised: 05/06/2013 Document Reviewed: 09/18/2012 °ExitCare® Patient Information ©2015 [employer]. This information is not intended to replace advice given to you by your health care provider. Make sure you discuss any questions you have with your health care provider. ° °Cough, Adult ° A cough is a reflex. It helps you clear your throat and airways. A cough can help heal your body. A cough can last 2 or 3 weeks (acute) or may last more than 8 weeks (chronic). Some common causes of a cough can include an infection, allergy, or a cold. °HOME CARE °· Only take medicine as told by your doctor. °· If given, take your medicines (antibiotics) as told. Finish them even if you start to feel better. °· Use a cold steam vaporizer or humidifier in your home. This can help loosen thick spit (secretions). °· Sleep so you are almost sitting up (semi-upright). Use pillows to do this. This helps reduce coughing. °· Rest as needed. °· Stop smoking if you smoke. °GET HELP RIGHT AWAY IF: °· You have yellowish-white fluid  (pus) in your thick spit. °· Your cough gets worse. °· Your medicine does not reduce coughing, and you are losing sleep. °· You cough up blood. °· You have trouble breathing. °· Your pain gets worse and medicine does not help. °· You have a fever. °MAKE SURE YOU:  °· Understand these instructions. °· Will watch your condition. °· Will get help right away if you are not doing well or get worse. °Document Released: 01/12/2011 Document Revised: 09/15/2013 Document Reviewed: 01/12/2011 °ExitCare® Patient Information ©2015 [employer]. This information is not intended to replace advice given to you by your health care provider. Make sure you discuss any questions you have with your health care provider. ° °

## 2014-07-14 ENCOUNTER — Encounter (HOSPITAL_COMMUNITY): Payer: Self-pay | Admitting: Emergency Medicine

## 2014-07-14 ENCOUNTER — Emergency Department (INDEPENDENT_AMBULATORY_CARE_PROVIDER_SITE_OTHER)
Admission: EM | Admit: 2014-07-14 | Discharge: 2014-07-14 | Disposition: A | Discharge status: Home / Self Care | Payer: Self-pay | Source: Home / Self Care | Attending: Family Medicine | Admitting: Family Medicine

## 2014-07-14 DIAGNOSIS — H66004 Acute suppurative otitis media without spontaneous rupture of ear drum, recurrent, right ear: Secondary | ICD-10-CM

## 2014-07-14 DIAGNOSIS — R05 Cough: Secondary | ICD-10-CM

## 2014-07-14 DIAGNOSIS — R059 Cough, unspecified: Secondary | ICD-10-CM

## 2014-07-14 MED ORDER — CEFTRIAXONE SODIUM 1 G IJ SOLR
1.0000 g | Freq: Once | INTRAMUSCULAR | Status: AC
  Administered 2014-07-14: 1 g via INTRAMUSCULAR

## 2014-07-14 MED ORDER — PREDNISONE 5 MG PO TABS: 30.0000 mg | ORAL_TABLET | Freq: Every day | ORAL | Status: DC

## 2014-07-14 MED ORDER — CEFDINIR 300 MG PO CAPS: 300.0000 mg | ORAL_CAPSULE | Freq: Two times a day (BID) | ORAL | Status: DC

## 2014-07-14 MED ORDER — LIDOCAINE HCL (PF) 1 % IJ SOLN
INTRAMUSCULAR | Status: AC
  Filled 2014-07-14: qty 5

## 2014-07-14 MED ORDER — CEFTRIAXONE SODIUM 1 G IJ SOLR
INTRAMUSCULAR | Status: AC
  Filled 2014-07-14: qty 10

## 2014-07-14 NOTE — ED Notes (Signed)
Pt states that she was seen and treated for ear infection pt states that she is not getting any better on antibiotics.

## 2014-07-14 NOTE — ED Provider Notes (Signed)
Heidi KoyanagiKourtney A Pearson is a 39 y.o. female who presents to Urgent Care today for right-sided ear pain. Patient was seen in the emergency room on February 28 for cough and otitis. She was given amoxicillin, prednisone, and hydrocodone. She is taking his medicine but notes that the ear pain has worsened considerably. The cough is persistent. No vomiting shortness of breath diarrhea fevers or chills. The pain is severe. She notes decreased hearing on that side as well.   Past Medical History  Diagnosis Date  . Anxiety   . Chronic knee pain   . Depression   . Anxiety   . PTSD (post-traumatic stress disorder)   . ADD (attention deficit disorder)    Past Surgical History  Procedure Laterality Date  . Cesarean section     History  Substance Use Topics  . Smoking status: Never Smoker   . Smokeless tobacco: Never Used  . Alcohol Use: No   ROS as above Medications: No current facility-administered medications for this encounter.   Current Outpatient Prescriptions  Medication Sig Dispense Refill  . acetaminophen (TYLENOL) 500 MG tablet Take 1,000 mg by mouth every 6 (six) hours as needed for mild pain.    Marland Kitchen. buPROPion (WELLBUTRIN XL) 150 MG 24 hr tablet Take 1 tablet (150 mg total) by mouth daily. For depression 30 tablet 0  . busPIRone (BUSPAR) 15 MG tablet Take 1 tablet (15 mg total) by mouth 2 (two) times daily. For anxiety 60 tablet 0  . cefdinir (OMNICEF) 300 MG capsule Take 1 capsule (300 mg total) by mouth 2 (two) times daily. 14 capsule 0  . dextromethorphan-guaiFENesin (MUCINEX DM) 30-600 MG per 12 hr tablet Take 1 tablet by mouth 2 (two) times daily.    . diclofenac sodium (VOLTAREN) 1 % GEL Apply 1 application topically 4 (four) times daily. 100 g 0  . diphenhydrAMINE (BENADRYL) 25 mg capsule Take 25 mg by mouth every 6 (six) hours as needed for allergies.    Marland Kitchen. doxepin (SINEQUAN) 25 MG capsule Take 1 capsule (25 mg total) by mouth at bedtime. For depression/anxiety 30 capsule 0  .  gabapentin (NEURONTIN) 400 MG capsule Take 1 capsul four times daily: For substance withdrawal syndrome 120 capsule 0  . guaiFENesin-dextromethorphan (ROBITUSSIN DM) 100-10 MG/5ML syrup Take 10 mLs by mouth every 4 (four) hours as needed for cough.    Marland Kitchen. HYDROcodone-acetaminophen (NORCO) 5-325 MG per tablet Take 1 tablet by mouth every 6 (six) hours as needed for moderate pain. 30 tablet 0  . hydrOXYzine (ATARAX/VISTARIL) 25 MG tablet Take 1 tablet (25 mg total) by mouth every 6 (six) hours as needed for anxiety. 60 tablet 0  . ibuprofen (ADVIL,MOTRIN) 200 MG tablet Take 400 mg by mouth every 6 (six) hours as needed for mild pain.    Marland Kitchen. Phenyleph-Doxylamine-DM-APAP 5-6.25-10-325 MG CAPS Take 2 capsules by mouth at bedtime as needed (cold symptoms).    . predniSONE (DELTASONE) 5 MG tablet Take 6 tablets (30 mg total) by mouth daily. 30 tablet 0  . traZODone (DESYREL) 50 MG tablet Take 1 tablet (50 mg total) by mouth at bedtime as needed and may repeat dose one time if needed for sleep. 60 tablet 0   Allergies  Allergen Reactions  . Codeine Itching  . Morphine And Related Hives  . Tramadol Other (See Comments)    Headaches/ dry mouth  . Vistaril [Hydroxyzine Hcl] Itching     Exam:  BP 138/98 mmHg  Pulse 85  Temp(Src) 98.5 F (36.9 C) (  Oral)  Resp 18  SpO2 96%  LMP 07/06/2014 Gen: Well NAD HEENT: EOMI,  MMM right tympanic membrane is erythematous bulging. Left is normal. Mastoids nontender bilaterally. Normal posterior pharynx Lungs: Normal work of breathing. CTABL occasional cough Heart: RRR no MRG Abd: NABS, Soft. Nondistended, Nontender Exts: Brisk capillary refill, warm and well perfused.   No results found for this or any previous visit (from the past 24 hour(s)). No results found.  Assessment and Plan: 39 y.o. female with  1) otitis media: Increase to Omnicef. Give 1 g ceftriaxone IM prior to discharge. Return as needed 2) cough. Increase to 30 mg prednisone  daily  Discussed warning signs or symptoms. Please see discharge instructions. Patient expresses understanding.     Rodolph Bong, MD 07/14/14 [phone]

## 2014-07-14 NOTE — Discharge Instructions (Signed)
Thank you for coming in today. Take Omnicef twice daily starting tomorrow. Take prednisone 6 pills daily starting today. Take up to 2 Aleve twice daily for pain Return as needed. Call or go to the emergency room if you get worse, have trouble breathing, have chest pains, or palpitations.   Otitis Media Otitis media is redness, soreness, and inflammation of the middle ear. Otitis media may be caused by allergies or, most commonly, by infection. Often it occurs as a complication of the common cold. SIGNS AND SYMPTOMS Symptoms of otitis media may include:  Earache.  Fever.  Ringing in your ear.  Headache.  Leakage of fluid from the ear. DIAGNOSIS To diagnose otitis media, your health care provider will examine your ear with an otoscope. This is an instrument that allows your health care provider to see into your ear in order to examine your eardrum. Your health care provider also will ask you questions about your symptoms. TREATMENT  Typically, otitis media resolves on its own within 3-5 days. Your health care provider may prescribe medicine to ease your symptoms of pain. If otitis media does not resolve within 5 days or is recurrent, your health care provider may prescribe antibiotic medicines if he or she suspects that a bacterial infection is the cause. HOME CARE INSTRUCTIONS   If you were prescribed an antibiotic medicine, finish it all even if you start to feel better.  Take medicines only as directed by your health care provider.  Keep all follow-up visits as directed by your health care provider. SEEK MEDICAL CARE IF:  You have otitis media only in one ear, or bleeding from your nose, or both.  You notice a lump on your neck.  You are not getting better in 3-5 days.  You feel worse instead of better. SEEK IMMEDIATE MEDICAL CARE IF:   You have pain that is not controlled with medicine.  You have swelling, redness, or pain around your ear or stiffness in your  neck.  You notice that part of your face is paralyzed.  You notice that the bone behind your ear (mastoid) is tender when you touch it. MAKE SURE YOU:   Understand these instructions.  Will watch your condition.  Will get help right away if you are not doing well or get worse. Document Released: 02/04/2004 Document Revised: 09/15/2013 Document Reviewed: 11/26/2012 [facility] Patient Information 2015 [employer], [place]. This information is not intended to replace advice given to you by your health care provider. Make sure you discuss any questions you have with your health care provider.

## 2014-07-17 ENCOUNTER — Emergency Department (HOSPITAL_COMMUNITY)
Admission: EM | Admit: 2014-07-17 | Discharge: 2014-07-18 | Disposition: A | Discharge status: Home / Self Care | Payer: Self-pay | Attending: Emergency Medicine | Admitting: Emergency Medicine

## 2014-07-17 ENCOUNTER — Encounter (HOSPITAL_COMMUNITY): Payer: Self-pay | Admitting: Emergency Medicine

## 2014-07-17 DIAGNOSIS — J40 Bronchitis, not specified as acute or chronic: Secondary | ICD-10-CM | POA: Insufficient documentation

## 2014-07-17 DIAGNOSIS — F909 Attention-deficit hyperactivity disorder, unspecified type: Secondary | ICD-10-CM | POA: Insufficient documentation

## 2014-07-17 DIAGNOSIS — Z7952 Long term (current) use of systemic steroids: Secondary | ICD-10-CM | POA: Insufficient documentation

## 2014-07-17 DIAGNOSIS — Z791 Long term (current) use of non-steroidal anti-inflammatories (NSAID): Secondary | ICD-10-CM | POA: Insufficient documentation

## 2014-07-17 DIAGNOSIS — F4310 Post-traumatic stress disorder, unspecified: Secondary | ICD-10-CM | POA: Insufficient documentation

## 2014-07-17 DIAGNOSIS — Z792 Long term (current) use of antibiotics: Secondary | ICD-10-CM | POA: Insufficient documentation

## 2014-07-17 DIAGNOSIS — F329 Major depressive disorder, single episode, unspecified: Secondary | ICD-10-CM | POA: Insufficient documentation

## 2014-07-17 DIAGNOSIS — Z79899 Other long term (current) drug therapy: Secondary | ICD-10-CM | POA: Insufficient documentation

## 2014-07-17 DIAGNOSIS — F419 Anxiety disorder, unspecified: Secondary | ICD-10-CM | POA: Insufficient documentation

## 2014-07-17 DIAGNOSIS — G8929 Other chronic pain: Secondary | ICD-10-CM | POA: Insufficient documentation

## 2014-07-17 DIAGNOSIS — H6691 Otitis media, unspecified, right ear: Secondary | ICD-10-CM | POA: Insufficient documentation

## 2014-07-17 MED ORDER — PSEUDOEPHEDRINE HCL ER 120 MG PO TB12
120.0000 mg | ORAL_TABLET | Freq: Two times a day (BID) | ORAL | Status: DC
  Administered 2014-07-18: 120 mg via ORAL
  Filled 2014-07-17 (×3): qty 1

## 2014-07-17 MED ORDER — ALBUTEROL SULFATE HFA 108 (90 BASE) MCG/ACT IN AERS
2.0000 | INHALATION_SPRAY | RESPIRATORY_TRACT | Status: DC | PRN
  Administered 2014-07-18: 2 via RESPIRATORY_TRACT
  Filled 2014-07-17: qty 6.7

## 2014-07-17 MED ORDER — HYDROCODONE-ACETAMINOPHEN 5-325 MG PO TABS
1.0000 | ORAL_TABLET | Freq: Once | ORAL | Status: AC
  Administered 2014-07-18: 1 via ORAL
  Filled 2014-07-17: qty 1

## 2014-07-17 NOTE — ED Notes (Signed)
Pt has ear ache and has taken antibiotics with no relief. Pt is on second antibiotic and was given a shot Tuesday. Pt reports she still has a cough as well as the ear ache.

## 2014-07-17 NOTE — ED Provider Notes (Addendum)
CSN: [id number]     Arrival date & time 07/17/14  2248 History  This chart was scribed for non-physician practitioner Earley FavorGail Samaiya Awadallah NP working with Lyanne CoKevin M Campos, MD by Conchita ParisNadim Abuhashem, ED Scribe. This patient was seen in [facility] and the patient's care was started at 11:23 PM.   No chief complaint on file.  The history is provided by the patient.    HPI Comments: Heidi Pearson is a 39 y.o. female who presents to the Emergency Department complaining of right ear pain. Pt was initially seen in the ED on February 28th for a cough. She was seen on 07/14/2014 and prescribed prednisone and omnicef which has not provided any relief. She was also given a shot of ceftriaxone. She has had a cough for several months, had an chest x-ray on 07/12/2014 which showed no acute abnormal findings. She has pain when she swallows, chews and lays down. She denies wheezing and congestion. No Hx of asthma and she is not a smoker.  Past Medical History  Diagnosis Date  . Anxiety   . Chronic knee pain   . Depression   . Anxiety   . PTSD (post-traumatic stress disorder)   . ADD (attention deficit disorder)    Past Surgical History  Procedure Laterality Date  . Cesarean section     History reviewed. No pertinent family history. History  Substance Use Topics  . Smoking status: Never Smoker   . Smokeless tobacco: Never Used  . Alcohol Use: No   OB History    No data available     Review of Systems  HENT: Positive for ear pain.   Respiratory: Negative for cough.   Neurological: Negative for headaches.    Allergies  Morphine and related  Home Medications   Prior to Admission medications   Medication Sig Start Date End Date Taking? Authorizing Provider  cefdinir (OMNICEF) 300 MG capsule Take 1 capsule (300 mg total) by mouth 2 (two) times daily. 07/14/14  Yes Rodolph BongEvan S Corey, MD  ibuprofen (ADVIL,MOTRIN) 200 MG tablet Take 800 mg by mouth every 6 (six) hours as needed for mild pain.    Yes Historical  Provider, MD  predniSONE (DELTASONE) 5 MG tablet Take 6 tablets (30 mg total) by mouth daily. 07/14/14  Yes Rodolph BongEvan S Corey, MD  acetaminophen (TYLENOL) 500 MG tablet Take 1,000 mg by mouth every 6 (six) hours as needed for mild pain.    Historical Provider, MD  buPROPion (WELLBUTRIN XL) 150 MG 24 hr tablet Take 1 tablet (150 mg total) by mouth daily. For depression Patient not taking: Reported on 07/17/2014 12/15/13   Sanjuana KavaAgnes I Nwoko, NP  busPIRone (BUSPAR) 15 MG tablet Take 1 tablet (15 mg total) by mouth 2 (two) times daily. For anxiety Patient not taking: Reported on 07/17/2014 12/16/13   Sanjuana KavaAgnes I Nwoko, NP  dextromethorphan-guaiFENesin [facility](MUCINEX DM) 30-600 MG per 12 hr tablet Take 1 tablet by mouth 2 (two) times daily.    Historical Provider, MD  diclofenac sodium (VOLTAREN) 1 % GEL Apply 1 application topically 4 (four) times daily. 06/19/14   Hayden Rasmussenavid Mabe, NP  diphenhydrAMINE (BENADRYL) 25 mg capsule Take 25 mg by mouth every 6 (six) hours as needed for allergies.    Historical Provider, MD  doxepin (SINEQUAN) 25 MG capsule Take 1 capsule (25 mg total) by mouth at bedtime. For depression/anxiety Patient not taking: Reported on 07/17/2014 12/16/13   Sanjuana KavaAgnes I Nwoko, NP  gabapentin (NEURONTIN) 400 MG capsule Take 1 capsul four times  daily: For substance withdrawal syndrome Patient not taking: Reported on 07/17/2014 12/15/13   Sanjuana Kava, NP  guaiFENesin-dextromethorphan (ROBITUSSIN DM) 100-10 MG/5ML syrup Take 10 mLs by mouth every 4 (four) hours as needed for cough.    Historical Provider, MD  HYDROcodone-acetaminophen (NORCO/VICODIN) 5-325 MG per tablet Take 1 tablet by mouth every 6 (six) hours as needed for moderate pain. 07/18/14   Arman Filter, NP  hydrOXYzine (ATARAX/VISTARIL) 25 MG tablet Take 1 tablet (25 mg total) by mouth every 6 (six) hours as needed for anxiety. Patient not taking: Reported on 07/17/2014 12/15/13   Sanjuana Kava, NP  Phenyleph-Doxylamine-DM-APAP 5-6.25-10-325 MG CAPS Take 2 capsules by mouth at  bedtime as needed (cold symptoms).    Historical Provider, MD  pseudoephedrine (SUDAFED) 120 MG 12 hr tablet Take 1 tablet (120 mg total) by mouth 2 (two) times daily. 07/18/14   Arman Filter, NP  traZODone (DESYREL) 50 MG tablet Take 1 tablet (50 mg total) by mouth at bedtime as needed and may repeat dose one time if needed for sleep. Patient not taking: Reported on 07/17/2014 12/15/13   Sanjuana Kava, NP   BP 126/74 mmHg  Pulse 75  Temp(Src) 98.6 F (37 C) (Oral)  Resp 20  SpO2 99%  LMP 07/06/2014 Physical Exam  Constitutional: She appears well-developed and well-nourished.  HENT:  Head: Normocephalic and atraumatic.  Eyes: Pupils are equal, round, and reactive to light.  Neck: Normal range of motion.  Cardiovascular: Normal rate.   Pulmonary/Chest: Effort normal.  Skin: Skin is warm.  Nursing note and vitals reviewed.   ED Course  Procedures  DIAGNOSTIC STUDIES: Oxygen Saturation is 99% on room air, normal by my interpretation.    COORDINATION OF CARE: 11:30 PM Discussed treatment plan with pt at bedside and pt agreed to plan.  Labs Review Labs Reviewed - No data to display  Imaging Review No results found.   EKG Interpretation None      MDM   Final diagnoses:  Otitis media follow-up, not resolved, right  Bronchitis    I personally performed the services described in this documentation, which was scribed in my presence. The recorded information has been reviewed and is accurate.     Arman Filter, NP 07/18/14 1610  Lyanne Co, MD 07/18/14 0316  Earley Favor, NP 08/30/14 [id number]  Azalia Bilis, MD 09/01/14 [phone]

## 2014-07-18 MED ORDER — AEROCHAMBER PLUS FLO-VU LARGE MISC
1.0000 | Freq: Once | Status: AC
  Administered 2014-07-18: 1
  Filled 2014-07-18: qty 1

## 2014-07-18 MED ORDER — HYDROCODONE-ACETAMINOPHEN 5-325 MG PO TABS: 1.0000 | ORAL_TABLET | Freq: Four times a day (QID) | ORAL | Status: DC | PRN

## 2014-07-18 MED ORDER — PSEUDOEPHEDRINE HCL ER 120 MG PO TB12: 120.0000 mg | ORAL_TABLET | Freq: Two times a day (BID) | ORAL | Status: DC

## 2014-07-18 NOTE — Discharge Instructions (Signed)
Please take the medications prescribed as directed.  Vicodin for cough or pain.  Sudafed twice a day for as a decongestant use the provided albuterol inhaler every 4-6 hours 2 puffs while awake if he wake during the night coughing.  Please use this, but do not such clock to wake herself up.  Continue taking her antibiotic as directed, as well as the prednisone.  If you do not see significant improvement in her symptoms.  He been referred to Dr. Ruby ColaWarlicki an ENT specialist.  Please call and make an appointment

## 2014-07-24 ENCOUNTER — Emergency Department (INDEPENDENT_AMBULATORY_CARE_PROVIDER_SITE_OTHER)
Admission: EM | Admit: 2014-07-24 | Discharge: 2014-07-24 | Disposition: A | Discharge status: Home / Self Care | Payer: Self-pay | Source: Home / Self Care | Attending: Family Medicine | Admitting: Family Medicine

## 2014-07-24 ENCOUNTER — Encounter (HOSPITAL_COMMUNITY): Payer: Self-pay

## 2014-07-24 DIAGNOSIS — H6981 Other specified disorders of Eustachian tube, right ear: Secondary | ICD-10-CM

## 2014-07-24 MED ORDER — TRIAMCINOLONE ACETONIDE 40 MG/ML IJ SUSP
40.0000 mg | Freq: Once | INTRAMUSCULAR | Status: AC
  Administered 2014-07-24: 40 mg via INTRAMUSCULAR

## 2014-07-24 MED ORDER — METHYLPREDNISOLONE ACETATE 80 MG/ML IJ SUSP
80.0000 mg | Freq: Once | INTRAMUSCULAR | Status: AC
  Administered 2014-07-24: 80 mg via INTRAMUSCULAR

## 2014-07-24 MED ORDER — TRIAMCINOLONE ACETONIDE 40 MG/ML IJ SUSP
INTRAMUSCULAR | Status: AC
  Filled 2014-07-24: qty 1

## 2014-07-24 MED ORDER — IPRATROPIUM BROMIDE 0.06 % NA SOLN: 2.0000 | Freq: Four times a day (QID) | NASAL | Status: DC

## 2014-07-24 MED ORDER — METHYLPREDNISOLONE ACETATE 80 MG/ML IJ SUSP
INTRAMUSCULAR | Status: AC
  Filled 2014-07-24: qty 1

## 2014-07-24 NOTE — ED Notes (Signed)
C/o ear pain x 2 weeks. Completed 2 courses of antibiotics.

## 2014-07-24 NOTE — Discharge Instructions (Signed)
Continue to get ears to pop, use medicine as prescribed

## 2014-07-24 NOTE — ED Provider Notes (Signed)
CSN: [id number]     Arrival date & time 07/24/14  1520 History   First MD Initiated Contact with Patient 07/24/14 1628     Chief Complaint  Patient presents with  . Otalgia   (Consider location/radiation/quality/duration/timing/severity/associated sxs/prior Treatment) Patient is a 39 y.o. female presenting with ear pain. The history is provided by the patient.  Otalgia Location:  Right Quality:  Pressure Severity:  Moderate Onset quality:  Gradual Duration:  2 weeks Progression:  Unchanged Chronicity:  New Context comment:  Has been seen twice in past few weeks for same, sx of pressure continue. Associated symptoms: congestion and hearing loss   Associated symptoms: no ear discharge     Past Medical History  Diagnosis Date  . Anxiety   . Chronic knee pain   . Depression   . Anxiety   . PTSD (post-traumatic stress disorder)   . ADD (attention deficit disorder)    Past Surgical History  Procedure Laterality Date  . Cesarean section     History reviewed. No pertinent family history. History  Substance Use Topics  . Smoking status: Never Smoker   . Smokeless tobacco: Never Used  . Alcohol Use: No   OB History    No data available     Review of Systems  Constitutional: Negative.   HENT: Positive for congestion, ear pain and hearing loss. Negative for ear discharge.     Allergies  Morphine and related  Home Medications   Prior to Admission medications   Medication Sig Start Date End Date Taking? Authorizing Provider  acetaminophen (TYLENOL) 500 MG tablet Take 1,000 mg by mouth every 6 (six) hours as needed for mild pain.    Historical Provider, MD  buPROPion (WELLBUTRIN XL) 150 MG 24 hr tablet Take 1 tablet (150 mg total) by mouth daily. For depression Patient not taking: Reported on 07/17/2014 12/15/13   Sanjuana KavaAgnes I Nwoko, NP  busPIRone (BUSPAR) 15 MG tablet Take 1 tablet (15 mg total) by mouth 2 (two) times daily. For anxiety Patient not taking: Reported on 07/17/2014  12/16/13   Sanjuana KavaAgnes I Nwoko, NP  cefdinir (OMNICEF) 300 MG capsule Take 1 capsule (300 mg total) by mouth 2 (two) times daily. 07/14/14   Rodolph BongEvan S Corey, MD  dextromethorphan-guaiFENesin [facility](MUCINEX DM) 30-600 MG per 12 hr tablet Take 1 tablet by mouth 2 (two) times daily.    Historical Provider, MD  diclofenac sodium (VOLTAREN) 1 % GEL Apply 1 application topically 4 (four) times daily. 06/19/14   Hayden Rasmussenavid Mabe, NP  diphenhydrAMINE (BENADRYL) 25 mg capsule Take 25 mg by mouth every 6 (six) hours as needed for allergies.    Historical Provider, MD  doxepin (SINEQUAN) 25 MG capsule Take 1 capsule (25 mg total) by mouth at bedtime. For depression/anxiety Patient not taking: Reported on 07/17/2014 12/16/13   Sanjuana KavaAgnes I Nwoko, NP  gabapentin (NEURONTIN) 400 MG capsule Take 1 capsul four times daily: For substance withdrawal syndrome Patient not taking: Reported on 07/17/2014 12/15/13   Sanjuana KavaAgnes I Nwoko, NP  guaiFENesin-dextromethorphan (ROBITUSSIN DM) 100-10 MG/5ML syrup Take 10 mLs by mouth every 4 (four) hours as needed for cough.    Historical Provider, MD  HYDROcodone-acetaminophen (NORCO/VICODIN) 5-325 MG per tablet Take 1 tablet by mouth every 6 (six) hours as needed for moderate pain. 07/18/14   Earley FavorGail Schulz, NP  hydrOXYzine (ATARAX/VISTARIL) 25 MG tablet Take 1 tablet (25 mg total) by mouth every 6 (six) hours as needed for anxiety. Patient not taking: Reported on 07/17/2014 12/15/13  Sanjuana Kava, NP  ibuprofen (ADVIL,MOTRIN) 200 MG tablet Take 800 mg by mouth every 6 (six) hours as needed for mild pain.     Historical Provider, MD  ipratropium (ATROVENT) 0.06 % nasal spray Place 2 sprays into both nostrils 4 (four) times daily. 07/24/14   Linna Hoff, MD  Phenyleph-Doxylamine-DM-APAP 5-6.25-10-325 MG CAPS Take 2 capsules by mouth at bedtime as needed (cold symptoms).    Historical Provider, MD  predniSONE (DELTASONE) 5 MG tablet Take 6 tablets (30 mg total) by mouth daily. 07/14/14   Rodolph Bong, MD  pseudoephedrine (SUDAFED)  120 MG 12 hr tablet Take 1 tablet (120 mg total) by mouth 2 (two) times daily. 07/18/14   Earley Favor, NP  traZODone (DESYREL) 50 MG tablet Take 1 tablet (50 mg total) by mouth at bedtime as needed and may repeat dose one time if needed for sleep. Patient not taking: Reported on 07/17/2014 12/15/13   Sanjuana Kava, NP   BP 140/75 mmHg  Pulse 97  Temp(Src) 99 F (37.2 C) (Oral)  Resp 18  SpO2 100%  LMP 07/06/2014 Physical Exam  Constitutional: She appears well-developed and well-nourished.  HENT:  Head: Normocephalic.  Right Ear: External ear and ear canal normal. Tympanic membrane is injected. Tympanic membrane mobility is abnormal. Decreased hearing is noted.  Left Ear: Tympanic membrane, external ear and ear canal normal.  Mouth/Throat: Oropharynx is clear and moist.  Nursing note and vitals reviewed.   ED Course  Procedures (including critical care time) Labs Review Labs Reviewed - No data to display  Imaging Review No results found.   MDM   1. ETD (eustachian tube dysfunction), right        Linna Hoff, MD 08/04/14 [phone]

## 2015-04-26 ENCOUNTER — Other Ambulatory Visit (HOSPITAL_COMMUNITY)
Admission: RE | Admit: 2015-04-26 | Discharge: 2015-04-26 | Disposition: A | Discharge status: Home / Self Care | Payer: Self-pay | Source: Ambulatory Visit | Attending: Family Medicine | Admitting: Family Medicine

## 2015-04-26 ENCOUNTER — Encounter (HOSPITAL_COMMUNITY): Payer: Self-pay | Admitting: *Deleted

## 2015-04-26 ENCOUNTER — Emergency Department (INDEPENDENT_AMBULATORY_CARE_PROVIDER_SITE_OTHER)
Admission: EM | Admit: 2015-04-26 | Discharge: 2015-04-26 | Disposition: A | Discharge status: Home / Self Care | Payer: Self-pay | Source: Home / Self Care | Attending: Family Medicine | Admitting: Family Medicine

## 2015-04-26 DIAGNOSIS — A609 Anogenital herpesviral infection, unspecified: Secondary | ICD-10-CM | POA: Insufficient documentation

## 2015-04-26 DIAGNOSIS — A6009 Herpesviral infection of other urogenital tract: Secondary | ICD-10-CM

## 2015-04-26 MED ORDER — VALACYCLOVIR HCL 1 G PO TABS: 1000.0000 mg | ORAL_TABLET | Freq: Two times a day (BID) | ORAL | Status: DC

## 2015-04-26 NOTE — Discharge Instructions (Signed)
Use medicine as prescribed, we will call if tests show a need for other treatment.  

## 2015-04-26 NOTE — ED Provider Notes (Signed)
CSN: [id number]     Arrival date & time 04/26/15  1610 History   First MD Initiated Contact with Patient 04/26/15 1709     Chief Complaint  Patient presents with  . Vaginal Pain   (Consider location/radiation/quality/duration/timing/severity/associated sxs/prior Treatment) Patient is a 39 y.o. female presenting with female genitourinary complaint. The history is provided by the patient.  Female GU Problem This is a new problem. The current episode started yesterday (vaginal blisters, painful, left sided, burns when urine touches lesions.). The problem has been gradually worsening. Pertinent negatives include no chest pain and no abdominal pain.    Past Medical History  Diagnosis Date  . Anxiety   . Chronic knee pain   . Depression   . Anxiety   . PTSD (post-traumatic stress disorder)   . ADD (attention deficit disorder)    Past Surgical History  Procedure Laterality Date  . Cesarean section     History reviewed. No pertinent family history. Social History  Substance Use Topics  . Smoking status: Never Smoker   . Smokeless tobacco: Never Used  . Alcohol Use: No   OB History    No data available     Review of Systems  Cardiovascular: Negative for chest pain.  Gastrointestinal: Negative.  Negative for abdominal pain.  Genitourinary: Positive for vaginal pain.  All other systems reviewed and are negative.   Allergies  Morphine and related  Home Medications   Prior to Admission medications   Medication Sig Start Date End Date Taking? Authorizing Provider  acetaminophen (TYLENOL) 500 MG tablet Take 1,000 mg by mouth every 6 (six) hours as needed for mild pain.    Historical Provider, MD  buPROPion (WELLBUTRIN XL) 150 MG 24 hr tablet Take 1 tablet (150 mg total) by mouth daily. For depression Patient not taking: Reported on 07/17/2014 12/15/13   Sanjuana Kava, NP  busPIRone (BUSPAR) 15 MG tablet Take 1 tablet (15 mg total) by mouth 2 (two) times daily. For anxiety Patient  not taking: Reported on 07/17/2014 12/16/13   Sanjuana Kava, NP  cefdinir (OMNICEF) 300 MG capsule Take 1 capsule (300 mg total) by mouth 2 (two) times daily. 07/14/14   Rodolph Bong, MD  dextromethorphan-guaiFENesin [facility] DM) 30-600 MG per 12 hr tablet Take 1 tablet by mouth 2 (two) times daily.    Historical Provider, MD  diclofenac sodium (VOLTAREN) 1 % GEL Apply 1 application topically 4 (four) times daily. 06/19/14   Hayden Rasmussen, NP  diphenhydrAMINE (BENADRYL) 25 mg capsule Take 25 mg by mouth every 6 (six) hours as needed for allergies.    Historical Provider, MD  doxepin (SINEQUAN) 25 MG capsule Take 1 capsule (25 mg total) by mouth at bedtime. For depression/anxiety Patient not taking: Reported on 07/17/2014 12/16/13   Sanjuana Kava, NP  gabapentin (NEURONTIN) 400 MG capsule Take 1 capsul four times daily: For substance withdrawal syndrome Patient not taking: Reported on 07/17/2014 12/15/13   Sanjuana Kava, NP  guaiFENesin-dextromethorphan (ROBITUSSIN DM) 100-10 MG/5ML syrup Take 10 mLs by mouth every 4 (four) hours as needed for cough.    Historical Provider, MD  HYDROcodone-acetaminophen (NORCO/VICODIN) 5-325 MG per tablet Take 1 tablet by mouth every 6 (six) hours as needed for moderate pain. 07/18/14   Earley Favor, NP  hydrOXYzine (ATARAX/VISTARIL) 25 MG tablet Take 1 tablet (25 mg total) by mouth every 6 (six) hours as needed for anxiety. Patient not taking: Reported on 07/17/2014 12/15/13   Sanjuana Kava, NP  ibuprofen (ADVIL,MOTRIN) 200 MG tablet Take 800 mg by mouth every 6 (six) hours as needed for mild pain.     Historical Provider, MD  ipratropium (ATROVENT) 0.06 % nasal spray Place 2 sprays into both nostrils 4 (four) times daily. 07/24/14   Linna HoffJames D Melven Stockard, MD  Phenyleph-Doxylamine-DM-APAP 5-6.25-10-325 MG CAPS Take 2 capsules by mouth at bedtime as needed (cold symptoms).    Historical Provider, MD  predniSONE (DELTASONE) 5 MG tablet Take 6 tablets (30 mg total) by mouth daily. 07/14/14   Rodolph BongEvan S Corey,  MD  pseudoephedrine (SUDAFED) 120 MG 12 hr tablet Take 1 tablet (120 mg total) by mouth 2 (two) times daily. 07/18/14   Earley FavorGail Schulz, NP  traZODone (DESYREL) 50 MG tablet Take 1 tablet (50 mg total) by mouth at bedtime as needed and may repeat dose one time if needed for sleep. Patient not taking: Reported on 07/17/2014 12/15/13   Sanjuana KavaAgnes I Nwoko, NP  valACYclovir (VALTREX) 1000 MG tablet Take 1 tablet (1,000 mg total) by mouth 2 (two) times daily. 04/26/15   Linna HoffJames D Kahlen Morais, MD   Meds Ordered and Administered this Visit  Medications - No data to display  BP 135/81 mmHg  Pulse 92  Temp(Src) 98.6 F (37 C) (Oral)  Resp 16  SpO2 97% No data found.   Physical Exam  Constitutional: She is oriented to person, place, and time. She appears well-developed and well-nourished. She appears distressed.  Genitourinary:    There is tenderness and lesion on the left labia. No vaginal discharge found.  Neurological: She is alert and oriented to person, place, and time.  Skin: Skin is warm and dry.  Nursing note and vitals reviewed.   ED Course  Procedures (including critical care time)  Labs Review Labs Reviewed  HERPES SIMPLEX VIRUS CULTURE    Imaging Review No results found.   Visual Acuity Review  Right Eye Distance:   Left Eye Distance:   Bilateral Distance:    Right Eye Near:   Left Eye Near:    Bilateral Near:         MDM   1. Genital herpes in women        Linna HoffJames D Brayant Dorr, MD 04/26/15 [phone]

## 2015-04-26 NOTE — ED Notes (Signed)
Pt reports   She  Noticed        3    Sores   Vaginal  Area   That  Are painfull        And      Burn  Especially  When  She  Urinates        she  Reports  She  Noticed  The  Symptoms  Yesterday       Pt  denys   Any    Vaginal  Discharge    -

## 2015-04-28 LAB — HERPES SIMPLEX VIRUS CULTURE: SPECIAL REQUESTS: NORMAL

## 2015-04-29 NOTE — ED Notes (Signed)
Called to discuss lab report , and after verifying ID, discussed positive findings.

## 2015-06-07 ENCOUNTER — Encounter (HOSPITAL_COMMUNITY): Payer: Self-pay | Admitting: Emergency Medicine

## 2015-06-07 ENCOUNTER — Emergency Department (HOSPITAL_COMMUNITY)
Admission: EM | Admit: 2015-06-07 | Discharge: 2015-06-07 | Disposition: A | Discharge status: Home / Self Care | Payer: Self-pay | Attending: Emergency Medicine | Admitting: Emergency Medicine

## 2015-06-07 ENCOUNTER — Emergency Department (HOSPITAL_COMMUNITY): Payer: Self-pay

## 2015-06-07 DIAGNOSIS — G8929 Other chronic pain: Secondary | ICD-10-CM | POA: Insufficient documentation

## 2015-06-07 DIAGNOSIS — M25562 Pain in left knee: Secondary | ICD-10-CM | POA: Insufficient documentation

## 2015-06-07 DIAGNOSIS — Z8659 Personal history of other mental and behavioral disorders: Secondary | ICD-10-CM | POA: Insufficient documentation

## 2015-06-07 DIAGNOSIS — Z79899 Other long term (current) drug therapy: Secondary | ICD-10-CM | POA: Insufficient documentation

## 2015-06-07 DIAGNOSIS — Z7952 Long term (current) use of systemic steroids: Secondary | ICD-10-CM | POA: Insufficient documentation

## 2015-06-07 NOTE — ED Notes (Addendum)
Left knee pain x "months," hx of chronic knee pain and "a lot of weight gain over the last year". States it's getting worse and kept her awake all night. Able to walk on it but states it occasionally gives out on her. States palpating area does not make pain worse, "because it doesn't feel like a muscle pain, it feels like it's in the bone." States she did not do anything to injure it.

## 2015-06-07 NOTE — Discharge Instructions (Signed)
[name],  Nice meeting you! Please follow-up with orthopedics if you continue to have pain. Return to the emergency department if you develop inability to walk on your knee or bear weight, swelling, redness or drainage from your knee. Feel better soon!  S. Lane Hacker, PA-C Heat Therapy Heat therapy can help ease sore, stiff, injured, and tight muscles and joints. Heat relaxes your muscles, which may help ease your pain.  RISKS AND COMPLICATIONS If you have any of the following conditions, do not use heat therapy unless your health care provider has approved:  Poor circulation.  Healing wounds or scarred skin in the area being treated.  Diabetes, heart disease, or high blood pressure.  Not being able to feel (numbness) the area being treated.  Unusual swelling of the area being treated.  Active infections.  Blood clots.  Cancer.  Inability to communicate pain. This may include young children and people who have problems with their brain function (dementia).  Pregnancy. Heat therapy should only be used on old, pre-existing, or long-lasting (chronic) injuries. Do not use heat therapy on new injuries unless directed by your health care provider. HOW TO USE HEAT THERAPY There are several different kinds of heat therapy, including:  Moist heat pack.  Warm water bath.  Hot water bottle.  Electric heating pad.  Heated gel pack.  Heated wrap.  Electric heating pad. Use the heat therapy method suggested by your health care provider. Follow your health care provider's instructions on when and how to use heat therapy. GENERAL HEAT THERAPY RECOMMENDATIONS  Do not sleep while using heat therapy. Only use heat therapy while you are awake.  Your skin may turn pink while using heat therapy. Do not use heat therapy if your skin turns red.  Do not use heat therapy if you have new pain.  High heat or long exposure to heat can cause burns. Be careful when using heat  therapy to avoid burning your skin.  Do not use heat therapy on areas of your skin that are already irritated, such as with a rash or sunburn. SEEK MEDICAL CARE IF:  You have blisters, redness, swelling, or numbness.  You have new pain.  Your pain is worse. MAKE SURE YOU:  Understand these instructions.  Will watch your condition.  Will get help right away if you are not doing well or get worse.   This information is not intended to replace advice given to you by your health care provider. Make sure you discuss any questions you have with your health care provider.   Document Released: 07/24/2011 Document Revised: 05/22/2014 Document Reviewed: 06/24/2013 Elsevier Interactive Patient Education 2016 [employer]  Joint Pain Joint pain, which is also called arthralgia, can be caused by many things. Joint pain often goes away when you follow your health care provider's instructions for relieving pain at home. However, joint pain can also be caused by conditions that require further treatment. Common causes of joint pain include:  Bruising in the area of the joint.  Overuse of the joint.  Wear and tear on the joints that occur with aging (osteoarthritis).  Various other forms of arthritis.  A buildup of a crystal form of uric acid in the joint (gout).  Infections of the joint (septic arthritis) or of the bone (osteomyelitis). Your health care provider may recommend medicine to help with the pain. If your joint pain continues, additional tests may be needed to diagnose your condition. HOME CARE INSTRUCTIONS Watch your condition  for any changes. Follow these instructions as directed to lessen the pain that you are feeling.  Take medicines only as directed by your health care provider.  Rest the affected area for as long as your health care provider says that you should. If directed to do so, raise the painful joint above the level of your heart while you are sitting or lying  down.  Do not do things that cause or worsen pain.  If directed, apply ice to the painful area:  Put ice in a plastic bag.  Place a towel between your skin and the bag.  Leave the ice on for 20 minutes, 2-3 times per day.  Wear an elastic bandage, splint, or sling as directed by your health care provider. Loosen the elastic bandage or splint if your fingers or toes become numb and tingle, or if they turn cold and blue.  Begin exercising or stretching the affected area as directed by your health care provider. Ask your health care provider what types of exercise are safe for you.  Keep all follow-up visits as directed by your health care provider. This is important. SEEK MEDICAL CARE IF:  Your pain increases, and medicine does not help.  Your joint pain does not improve within 3 days.  You have increased bruising or swelling.  You have a fever.  You lose 10 lb (4.5 kg) or more without trying. SEEK IMMEDIATE MEDICAL CARE IF:  You are not able to move the joint.  Your fingers or toes become numb or they turn cold and blue.   This information is not intended to replace advice given to you by your health care provider. Make sure you discuss any questions you have with your health care provider.   Document Released: 05/01/2005 Document Revised: 05/22/2014 Document Reviewed: 02/10/2014 Elsevier Interactive Patient Education [employer]

## 2015-06-07 NOTE — ED Notes (Signed)
Patient transported to X-ray 

## 2015-06-07 NOTE — ED Provider Notes (Signed)
CSN: [id number]     Arrival date & time 06/07/15  1038 History  By signing my name below, I, Heidi Pearson, attest that this documentation has been prepared under the direction and in the presence of Heidi Krebs PA-C. Electronically Signed: Placido Pearson, ED Scribe. 06/07/2015. 12:08 PM.    Chief Complaint  Patient presents with  . Knee Pain   The history is provided by the patient. No language interpreter was used.    HPI Comments: Heidi Pearson is a 40 y.o. female who presents to the Emergency Department complaining of worsening, chronic, moderate, anterior, left knee pain which began 2 months ago and worsened in the past few days. She describes her pain as non-radiating, discomfort, 3/10 pain scale. She notes experiencing associated weakness in the affected knee typically 2x per day but denies it causes her to fall. Pt notes recently gaining weight which she says has worsened her pain. She notes taking 800 mg ibuprofen intermittently for pain management which provides short term relief of her pain. She denies fevers, chills or any other associated symptoms at this time.   Past Medical History  Diagnosis Date  . Anxiety   . Chronic knee pain   . Depression   . Anxiety   . PTSD (post-traumatic stress disorder)   . ADD (attention deficit disorder)    Past Surgical History  Procedure Laterality Date  . Cesarean section     History reviewed. No pertinent family history. Social History  Substance Use Topics  . Smoking status: Never Smoker   . Smokeless tobacco: Never Used  . Alcohol Use: No   OB History    No data available     Review of Systems  Musculoskeletal: Positive for arthralgias. Negative for myalgias and joint swelling.  Neurological: Positive for weakness.       Left knee weakness    Allergies  Morphine and related  Home Medications   Prior to Admission medications   Medication Sig Start Date End Date Taking? Authorizing Provider   acetaminophen (TYLENOL) 500 MG tablet Take 1,000 mg by mouth every 6 (six) hours as needed for mild pain.    Historical Provider, MD  buPROPion (WELLBUTRIN XL) 150 MG 24 hr tablet Take 1 tablet (150 mg total) by mouth daily. For depression Patient not taking: Reported on 07/17/2014 12/15/13   Sanjuana Kava, NP  busPIRone (BUSPAR) 15 MG tablet Take 1 tablet (15 mg total) by mouth 2 (two) times daily. For anxiety Patient not taking: Reported on 07/17/2014 12/16/13   Sanjuana Kava, NP  cefdinir (OMNICEF) 300 MG capsule Take 1 capsule (300 mg total) by mouth 2 (two) times daily. 07/14/14   Heidi Bong, MD  dextromethorphan-guaiFENesin [facility] DM) 30-600 MG per 12 hr tablet Take 1 tablet by mouth 2 (two) times daily.    Historical Provider, MD  diclofenac sodium (VOLTAREN) 1 % GEL Apply 1 application topically 4 (four) times daily. 06/19/14   Hayden Rasmussen, NP  diphenhydrAMINE (BENADRYL) 25 mg capsule Take 25 mg by mouth every 6 (six) hours as needed for allergies.    Historical Provider, MD  doxepin (SINEQUAN) 25 MG capsule Take 1 capsule (25 mg total) by mouth at bedtime. For depression/anxiety Patient not taking: Reported on 07/17/2014 12/16/13   Sanjuana Kava, NP  gabapentin (NEURONTIN) 400 MG capsule Take 1 capsul four times daily: For substance withdrawal syndrome Patient not taking: Reported on 07/17/2014 12/15/13   Sanjuana Kava, NP  guaiFENesin-dextromethorphan [facility] DM)  100-10 MG/5ML syrup Take 10 mLs by mouth every 4 (four) hours as needed for cough.    Historical Provider, MD  HYDROcodone-acetaminophen (NORCO/VICODIN) 5-325 MG per tablet Take 1 tablet by mouth every 6 (six) hours as needed for moderate pain. 07/18/14   Heidi Favor, NP  hydrOXYzine (ATARAX/VISTARIL) 25 MG tablet Take 1 tablet (25 mg total) by mouth every 6 (six) hours as needed for anxiety. Patient not taking: Reported on 07/17/2014 12/15/13   Sanjuana Kava, NP  ibuprofen (ADVIL,MOTRIN) 200 MG tablet Take 800 mg by mouth every 6 (six) hours as  needed for mild pain.     Historical Provider, MD  ipratropium (ATROVENT) 0.06 % nasal spray Place 2 sprays into both nostrils 4 (four) times daily. 07/24/14   Heidi Hoff, MD  Phenyleph-Doxylamine-DM-APAP 5-6.25-10-325 MG CAPS Take 2 capsules by mouth at bedtime as needed (cold symptoms).    Historical Provider, MD  predniSONE (DELTASONE) 5 MG tablet Take 6 tablets (30 mg total) by mouth daily. 07/14/14   Heidi Bong, MD  pseudoephedrine (SUDAFED) 120 MG 12 hr tablet Take 1 tablet (120 mg total) by mouth 2 (two) times daily. 07/18/14   Heidi Favor, NP  traZODone (DESYREL) 50 MG tablet Take 1 tablet (50 mg total) by mouth at bedtime as needed and may repeat dose one time if needed for sleep. Patient not taking: Reported on 07/17/2014 12/15/13   Sanjuana Kava, NP  valACYclovir (VALTREX) 1000 MG tablet Take 1 tablet (1,000 mg total) by mouth 2 (two) times daily. 04/26/15   Heidi Hoff, MD   BP 132/88 mmHg  Pulse 77  Temp(Src) 98.3 F (36.8 C) (Oral)  Resp 16  SpO2 99%   Physical Exam  Constitutional: She is oriented to person, place, and time. She appears well-developed and well-nourished. No distress.  HENT:  Head: Normocephalic and atraumatic.  Mouth/Throat: Oropharynx is clear and moist. No oropharyngeal exudate.  Eyes: Conjunctivae and EOM are normal. Pupils are equal, round, and reactive to light. Right eye exhibits no discharge. Left eye exhibits no discharge. No scleral icterus.  Neck: No tracheal deviation present.  Cardiovascular: Normal rate, regular rhythm, normal heart sounds and intact distal pulses.  Exam reveals no gallop and no friction rub.   No murmur heard. Pulmonary/Chest: Effort normal and breath sounds normal. No respiratory distress. She has no wheezes. She has no rales. She exhibits no tenderness.  Abdominal: Soft. Bowel sounds are normal. She exhibits no distension.  Musculoskeletal: Normal range of motion. She exhibits tenderness. She exhibits no edema.  Mild, diffuse,  left knee tenderness. No crepitus, deformity, edema, erythema. Neurovascularly intact bilaterally. Negative Homan's sign.   Lymphadenopathy:    She has no cervical adenopathy.  Neurological: She is alert and oriented to person, place, and time. Coordination normal.  Skin: Skin is warm and dry. No rash noted. She is not diaphoretic. No erythema.  Psychiatric: She has a normal mood and affect. Her behavior is normal.  Nursing note and vitals reviewed.   ED Course  Procedures  DIAGNOSTIC STUDIES: Oxygen Saturation is 99% on RA, normal by my interpretation.    COORDINATION OF CARE: 12:06 PM Pt presents today due to left knee pain. Discussed next steps with pt including a DG of the left knee and reevaluation based on imaging results. Pt agreed to plan.  Imaging Review Dg Knee Complete 4 Views Left  06/07/2015  CLINICAL DATA:  Increasing left knee pain.  No history of trauma. EXAM: LEFT KNEE -  COMPLETE 4+ VIEW COMPARISON:  None. FINDINGS: There is no evidence of fracture, dislocation, or joint effusion. There is no evidence of arthropathy or other focal bone abnormality. Soft tissues are unremarkable. IMPRESSION: Negative. Electronically Signed   By: Ted Mcalpine M.D.   On: 06/07/2015 12:29   I have personally reviewed and evaluated these images as part of my medical decision-making.  MDM   Final diagnoses:  Left knee pain   Patient non-toxic appearing and VSS. Afebrile. No edema, erythema, warmth, decreased ROM, drainage on exam. Based on patient history and physical exam, most likely etiologies include rheumatoid arthritis vs OA. Less likely etiologies include trauma (fracture, dislocation, hemarthrosis, osteonecrosis, tenosynovitis), infectious (non-GC vs GC septic arthritis, reactive arthritis, tenosynovitis, lyme disease), gout, pseudogout, musculoskeletal (bursitis, tendinitis).  Patient declined wheelchair to xray. Ambulated to and from xray without difficulty.  Patient feels  improved after observation in ED.  Patient may be safely discharged home with encouragement to continue 800 mg ibuprofen TID with meals for 2 weeks.  Discussed reasons for return. Patient to follow-up with primary care provider within two weeks. Patient in understanding and agreement with the plan.   I personally performed the services described in this documentation, which was scribed in my presence. The recorded information has been reviewed and is accurate.   Heidi Krebs, PA-C 06/09/15 2253  Benjiman Core, MD 06/10/15 [phone]

## 2015-06-07 NOTE — ED Notes (Signed)
Verbalized understanding discharge instructions. In no acute distress.   

## 2015-10-31 ENCOUNTER — Emergency Department (HOSPITAL_COMMUNITY)
Admission: EM | Admit: 2015-10-31 | Discharge: 2015-10-31 | Disposition: A | Discharge status: Home / Self Care | Payer: Self-pay | Attending: Emergency Medicine | Admitting: Emergency Medicine

## 2015-10-31 ENCOUNTER — Encounter (HOSPITAL_COMMUNITY): Payer: Self-pay | Admitting: Emergency Medicine

## 2015-10-31 DIAGNOSIS — Z79899 Other long term (current) drug therapy: Secondary | ICD-10-CM | POA: Insufficient documentation

## 2015-10-31 DIAGNOSIS — J069 Acute upper respiratory infection, unspecified: Secondary | ICD-10-CM | POA: Insufficient documentation

## 2015-10-31 DIAGNOSIS — F329 Major depressive disorder, single episode, unspecified: Secondary | ICD-10-CM | POA: Insufficient documentation

## 2015-10-31 DIAGNOSIS — F909 Attention-deficit hyperactivity disorder, unspecified type: Secondary | ICD-10-CM | POA: Insufficient documentation

## 2015-10-31 LAB — RAPID STREP SCREEN (MED CTR MEBANE ONLY): Streptococcus, Group A Screen (Direct): NEGATIVE

## 2015-10-31 MED ORDER — GUAIFENESIN-CODEINE 100-10 MG/5ML PO SOLN: 10.0000 mL | Freq: Four times a day (QID) | ORAL | Status: DC | PRN

## 2015-10-31 NOTE — Discharge Instructions (Signed)
Ibuprofen 600 mg every 6 hours as needed for fever or pain.  Robitussin with codeine as prescribed as needed for cough.  Return to the emergency department if your symptoms significantly worsen or change.   Upper Respiratory Infection, Adult Most upper respiratory infections (URIs) are a viral infection of the air passages leading to the lungs. A URI affects the nose, throat, and upper air passages. The most common type of URI is nasopharyngitis and is typically referred to as "the common cold." URIs run their course and usually go away on their own. Most of the time, a URI does not require medical attention, but sometimes a bacterial infection in the upper airways can follow a viral infection. This is called a secondary infection. Sinus and middle ear infections are common types of secondary upper respiratory infections. Bacterial pneumonia can also complicate a URI. A URI can worsen asthma and chronic obstructive pulmonary disease (COPD). Sometimes, these complications can require emergency medical care and may be life threatening.  CAUSES Almost all URIs are caused by viruses. A virus is a type of germ and can spread from one person to another.  RISKS FACTORS You may be at risk for a URI if:   You smoke.   You have chronic heart or lung disease.  You have a weakened defense (immune) system.   You are very young or very old.   You have nasal allergies or asthma.  You work in crowded or poorly ventilated areas.  You work in health care facilities or schools. SIGNS AND SYMPTOMS  Symptoms typically develop 2-3 days after you come in contact with a cold virus. Most viral URIs last 7-10 days. However, viral URIs from the influenza virus (flu virus) can last 14-18 days and are typically more severe. Symptoms may include:   Runny or stuffy (congested) nose.   Sneezing.   Cough.   Sore throat.   Headache.   Fatigue.   Fever.   Loss of appetite.   Pain in your  forehead, behind your eyes, and over your cheekbones (sinus pain).  Muscle aches.  DIAGNOSIS  Your health care provider may diagnose a URI by:  Physical exam.  Tests to check that your symptoms are not due to another condition such as:  Strep throat.  Sinusitis.  Pneumonia.  Asthma. TREATMENT  A URI goes away on its own with time. It cannot be cured with medicines, but medicines may be prescribed or recommended to relieve symptoms. Medicines may help:  Reduce your fever.  Reduce your cough.  Relieve nasal congestion. HOME CARE INSTRUCTIONS   Take medicines only as directed by your health care provider.   Gargle warm saltwater or take cough drops to comfort your throat as directed by your health care provider.  Use a warm mist humidifier or inhale steam from a shower to increase air moisture. This may make it easier to breathe.  Drink enough fluid to keep your urine clear or pale yellow.   Eat soups and other clear broths and maintain good nutrition.   Rest as needed.   Return to work when your temperature has returned to normal or as your health care provider advises. You may need to stay home longer to avoid infecting others. You can also use a face mask and careful hand washing to prevent spread of the virus.  Increase the usage of your inhaler if you have asthma.   Do not use any tobacco products, including cigarettes, chewing tobacco, or electronic cigarettes. If  you need help quitting, ask your health care provider. PREVENTION  The best way to protect yourself from getting a cold is to practice good hygiene.   Avoid oral or hand contact with people with cold symptoms.   Wash your hands often if contact occurs.  There is no clear evidence that vitamin C, vitamin E, echinacea, or exercise reduces the chance of developing a cold. However, it is always recommended to get plenty of rest, exercise, and practice good nutrition.  SEEK MEDICAL CARE IF:   You  are getting worse rather than better.   Your symptoms are not controlled by medicine.   You have chills.  You have worsening shortness of breath.  You have brown or red mucus.  You have yellow or brown nasal discharge.  You have pain in your face, especially when you bend forward.  You have a fever.  You have swollen neck glands.  You have pain while swallowing.  You have white areas in the back of your throat. SEEK IMMEDIATE MEDICAL CARE IF:   You have severe or persistent:  Headache.  Ear pain.  Sinus pain.  Chest pain.  You have chronic lung disease and any of the following:  Wheezing.  Prolonged cough.  Coughing up blood.  A change in your usual mucus.  You have a stiff neck.  You have changes in your:  Vision.  Hearing.  Thinking.  Mood. MAKE SURE YOU:   Understand these instructions.  Will watch your condition.  Will get help right away if you are not doing well or get worse.   This information is not intended to replace advice given to you by your health care provider. Make sure you discuss any questions you have with your health care provider.   Document Released: 10/25/2000 Document Revised: 09/15/2014 Document Reviewed: 08/06/2013 Elsevier Interactive Patient Education [employer]2016 [employer] Inc.

## 2015-10-31 NOTE — ED Notes (Signed)
Pt reports understanding of discharge information. No questions at time of discharge 

## 2015-10-31 NOTE — ED Provider Notes (Signed)
CSN: [id number]     Arrival date & time 10/31/15  0343 History   First MD Initiated Contact with Patient 10/31/15 0356     Chief Complaint  Patient presents with  . Cough  . Sore Throat     (Consider location/radiation/quality/duration/timing/severity/associated sxs/prior Treatment) HPI Comments: Patient is a 40 year old female with no significant past medical history. She presents for evaluation of sore throat, cough, and congestion for the past 3 days. She reports low-grade fevers. She denies any ill contacts.  Patient is a 40 y.o. female presenting with cough. The history is provided by the patient.  Cough Cough characteristics:  Non-productive Severity:  Moderate Onset quality:  Sudden Duration:  3 days Timing:  Constant Progression:  Worsening Chronicity:  New Relieved by:  Nothing Worsened by:  Nothing tried Ineffective treatments:  None tried Associated symptoms: fever and sore throat   Associated symptoms: no chills     Past Medical History  Diagnosis Date  . Anxiety   . Chronic knee pain   . Depression   . Anxiety   . PTSD (post-traumatic stress disorder)   . ADD (attention deficit disorder)    Past Surgical History  Procedure Laterality Date  . Cesarean section     History reviewed. No pertinent family history. Social History  Substance Use Topics  . Smoking status: Never Smoker   . Smokeless tobacco: Never Used  . Alcohol Use: No   OB History    No data available     Review of Systems  Constitutional: Positive for fever. Negative for chills.  HENT: Positive for sore throat.   Respiratory: Positive for cough.   All other systems reviewed and are negative.     Allergies  Morphine and related  Home Medications   Prior to Admission medications   Medication Sig Start Date End Date Taking? Authorizing Provider  Phenyleph-CPM-DM-Aspirin (ALKA-SELTZER PLUS COLD & COUGH) 7.12-14-08-325 MG TBEF Take 2 tablets by mouth every 6 (six) hours as needed  (for cold symptoms.).   Yes Historical Provider, MD  Phenylephrine-Pheniramine-DM [facility]THERAFLU COLD & COUGH) 03-04-19 MG PACK Take 1 packet by mouth every 6 (six) hours as needed (for cold symptoms.).   Yes Historical Provider, MD   BP 132/86 mmHg  Pulse 88  Temp(Src) 98.4 F (36.9 C) (Oral)  Resp 18  Ht 5\' 3"  (1.6 m)  Wt 200 lb (90.719 kg)  BMI 35.44 kg/m2  SpO2 97% Physical Exam  Constitutional: She is oriented to person, place, and time. She appears well-developed and well-nourished. No distress.  HENT:  Head: Normocephalic and atraumatic.  Mouth/Throat: Oropharynx is clear and moist. No oropharyngeal exudate.  Neck: Normal range of motion. Neck supple.  Cardiovascular: Normal rate and regular rhythm.  Exam reveals no gallop and no friction rub.   No murmur heard. Pulmonary/Chest: Effort normal and breath sounds normal. No respiratory distress. She has no wheezes.  Abdominal: Soft. Bowel sounds are normal. She exhibits no distension. There is no tenderness.  Musculoskeletal: Normal range of motion. She exhibits no edema.  Lymphadenopathy:    She has no cervical adenopathy.  Neurological: She is alert and oriented to person, place, and time.  Skin: Skin is warm and dry. She is not diaphoretic.  Nursing note and vitals reviewed.   ED Course  Procedures (including critical care time) Labs Review Labs Reviewed  RAPID STREP SCREEN (NOT AT [facility])    Imaging Review No results found. I have personally reviewed and evaluated these images and lab results as  part of my medical decision-making.    MDM   Final diagnoses:  None    Strep test is negative. I highly suspect a viral etiology. I will recommend ibuprofen for fever and also prescribe Robitussin with codeine which she can take for cough. She is to follow-up with primary Dr. if not improving in the next week.    Geoffery Lyons, MD 10/31/15 [phone]

## 2015-10-31 NOTE — ED Notes (Signed)
Patient complaining of sore throat, coughing, and a sore chest. Patient states this started Thursday. Patient states is not get better. Patient has taken medication for this.

## 2015-11-02 LAB — CULTURE, GROUP A STREP (THRC)

## 2015-11-04 ENCOUNTER — Emergency Department (HOSPITAL_COMMUNITY)
Admission: EM | Admit: 2015-11-04 | Discharge: 2015-11-04 | Disposition: A | Discharge status: Home / Self Care | Payer: Self-pay | Attending: Emergency Medicine | Admitting: Emergency Medicine

## 2015-11-04 ENCOUNTER — Emergency Department (HOSPITAL_COMMUNITY): Payer: Self-pay

## 2015-11-04 ENCOUNTER — Encounter (HOSPITAL_COMMUNITY): Payer: Self-pay

## 2015-11-04 DIAGNOSIS — F329 Major depressive disorder, single episode, unspecified: Secondary | ICD-10-CM | POA: Insufficient documentation

## 2015-11-04 DIAGNOSIS — Z7982 Long term (current) use of aspirin: Secondary | ICD-10-CM | POA: Insufficient documentation

## 2015-11-04 DIAGNOSIS — R059 Cough, unspecified: Secondary | ICD-10-CM

## 2015-11-04 DIAGNOSIS — R05 Cough: Secondary | ICD-10-CM

## 2015-11-04 DIAGNOSIS — B9789 Other viral agents as the cause of diseases classified elsewhere: Secondary | ICD-10-CM | POA: Insufficient documentation

## 2015-11-04 DIAGNOSIS — J069 Acute upper respiratory infection, unspecified: Secondary | ICD-10-CM | POA: Insufficient documentation

## 2015-11-04 MED ORDER — BENZONATATE 100 MG PO CAPS: 100.0000 mg | ORAL_CAPSULE | Freq: Three times a day (TID) | ORAL | Status: DC | PRN

## 2015-11-04 MED ORDER — GUAIFENESIN-CODEINE 100-10 MG/5ML PO SOLN: 10.0000 mL | Freq: Four times a day (QID) | ORAL | Status: DC | PRN

## 2015-11-04 NOTE — ED Notes (Signed)
PT DISCHARGED. INSTRUCTIONS AND PRESCRIPTIONS GIVEN. AAOX4. PT IN NO APPARENT DISTRESS OR PAIN. THE OPPORTUNITY TO ASK QUESTIONS WAS PROVIDED. 

## 2015-11-04 NOTE — ED Notes (Signed)
Pt c/o URI symptoms and increasing cough x 1 week.  Pain score 4/10.  Pt reports chest pain only w/ coughing.  [facility] and diagnosed w/ an URI.  Pt sts she has been taking prescribed cough medication as directed.  Dry cough noted.

## 2015-11-04 NOTE — ED Provider Notes (Signed)
CSN: [id number]     Arrival date & time 11/04/15  0907 History   First MD Initiated Contact with Patient 11/04/15 1025     Chief Complaint  Patient presents with  . URI  . Cough     (Consider location/radiation/quality/duration/timing/severity/associated sxs/prior Treatment) HPI  40 year old female presents with worsening cough. Has been having cough for the past 1 week. It is a dry cough. Cough seems her particular worse at night. Had fevers the first day but no fever since. Seen here 4 days ago and prescribed cough medicine is now gone. It would help for about one hour but not much more than that. Has felt some shortness of breath on walking. Also feels diffusely weak, she states she thinks this is from not sleeping due to the cough at night. Some chest pain when coughing. Sore throat when coughing but otherwise no sore throat. Feels like her voice is going. Also some mild right ear pain.  Past Medical History  Diagnosis Date  . Anxiety   . Chronic knee pain   . Depression   . Anxiety   . PTSD (post-traumatic stress disorder)   . ADD (attention deficit disorder)    Past Surgical History  Procedure Laterality Date  . Cesarean section     History reviewed. No pertinent family history. Social History  Substance Use Topics  . Smoking status: Never Smoker   . Smokeless tobacco: Never Used  . Alcohol Use: No   OB History    No data available     Review of Systems  Constitutional: Negative for fever.  HENT: Positive for congestion, ear pain, sore throat and voice change. Negative for trouble swallowing.   Respiratory: Positive for cough and shortness of breath.   Cardiovascular: Positive for chest pain (when coughing).  Gastrointestinal: Positive for vomiting (post-tussive twice).  All other systems reviewed and are negative.     Allergies  Morphine and related  Home Medications   Prior to Admission medications   Medication Sig Start Date End Date Taking? Authorizing  Provider  guaiFENesin-codeine 100-10 MG/5ML syrup Take 10 mLs by mouth every 6 (six) hours as needed for cough. 10/31/15  Yes Geoffery Lyonsouglas Delo, MD  Phenyleph-CPM-DM-Aspirin (ALKA-SELTZER PLUS COLD & COUGH) 7.12-14-08-325 MG TBEF Take 2 tablets by mouth every 6 (six) hours as needed (for cold symptoms.).   Yes Historical Provider, MD  Phenyleph-Doxylamine-DM-APAP (NYQUIL SEVERE COLD/FLU) 5-6.25-10-325 MG/15ML LIQD Take 30 mLs by mouth once.   Yes Historical Provider, MD  Phenylephrine-Pheniramine-DM [facility](THERAFLU COLD & COUGH) 03-04-19 MG PACK Take 1 packet by mouth every 6 (six) hours as needed (for cold symptoms.).   Yes Historical Provider, MD   BP 130/85 mmHg  Pulse 84  Temp(Src) 98.3 F (36.8 C) (Oral)  Resp 16  SpO2 97%  LMP 11/01/2015 Physical Exam  Constitutional: She is oriented to person, place, and time. She appears well-developed and well-nourished.  Intermittent dry, hacking cough Speaks in full sentences  HENT:  Head: Normocephalic and atraumatic.  Right Ear: Tympanic membrane, external ear and ear canal normal.  Left Ear: Tympanic membrane, external ear and ear canal normal.  Nose: Nose normal.  Mouth/Throat: Uvula is midline and oropharynx is clear and moist. No uvula swelling. No oropharyngeal exudate, posterior oropharyngeal edema, posterior oropharyngeal erythema or tonsillar abscesses.  Eyes: Right eye exhibits no discharge. Left eye exhibits no discharge.  Neck: Neck supple.  Cardiovascular: Normal rate, regular rhythm and normal heart sounds.   Pulmonary/Chest: Effort normal and breath sounds normal. She  has no wheezes. She has no rales.  Abdominal: Soft. There is no tenderness.  Neurological: She is alert and oriented to person, place, and time.  Skin: Skin is warm and dry.  Nursing note and vitals reviewed.   ED Course  Procedures (including critical care time) Labs Review Labs Reviewed - No data to display  Imaging Review Dg Chest 2 View  11/04/2015  CLINICAL  DATA:  Upper respiratory tract infection.  Cough. EXAM: CHEST  2 VIEW COMPARISON:  07/12/2014 FINDINGS: The heart size and mediastinal contours are within normal limits. Both lungs are clear. The visualized skeletal structures are unremarkable. IMPRESSION: No active cardiopulmonary disease. Electronically Signed   By: Signa Kellaylor  Stroud M.D.   On: 11/04/2015 10:52   I have personally reviewed and evaluated these images and lab results as part of my medical decision-making.   EKG Interpretation None      MDM   Final diagnoses:  Cough  Viral upper respiratory infection    I believe patient is suffering from a viral respiratory infection. Appears to have some component of laryngitis with her voice change but she has no acute shortness of breath currently and no respiratory distress. X-ray shows no pneumonia. No obvious bacterial infection at this time. Will treat cough and recommend follow-up with a PCP. Discussed return precautions.    Pricilla LovelessScott Esaias Cleavenger, MD 11/04/15 1730

## 2016-08-01 ENCOUNTER — Encounter (HOSPITAL_COMMUNITY): Payer: Self-pay | Admitting: Emergency Medicine

## 2016-08-01 ENCOUNTER — Ambulatory Visit (HOSPITAL_COMMUNITY)
Admission: EM | Admit: 2016-08-01 | Discharge: 2016-08-01 | Disposition: A | Discharge status: Home / Self Care | Payer: Self-pay | Attending: Family Medicine | Admitting: Family Medicine

## 2016-08-01 DIAGNOSIS — J020 Streptococcal pharyngitis: Secondary | ICD-10-CM

## 2016-08-01 LAB — POCT RAPID STREP A: STREPTOCOCCUS, GROUP A SCREEN (DIRECT): POSITIVE — AB

## 2016-08-01 MED ORDER — MAGIC MOUTHWASH W/LIDOCAINE: 5.0000 mL | Freq: Three times a day (TID) | ORAL | 0 refills | Status: DC | PRN

## 2016-08-01 MED ORDER — PENICILLIN G BENZATHINE 1200000 UNIT/2ML IM SUSP
1.2000 10*6.[IU] | Freq: Once | INTRAMUSCULAR | Status: AC
  Administered 2016-08-01: 1.2 10*6.[IU] via INTRAMUSCULAR

## 2016-08-01 MED ORDER — PENICILLIN G BENZATHINE 1200000 UNIT/2ML IM SUSP
INTRAMUSCULAR | Status: AC
  Filled 2016-08-01: qty 2

## 2016-08-01 NOTE — Discharge Instructions (Signed)
You have strep pharyngitis. I recommend you finish the azithromycin that was prescribed to yesterday, you've also received an injection here today of 1.2 million units of penicillin. I prescribed Magic mouthwash with lidocaine, recommend swish and swallow 3 times a day as needed. For pain I recommend over-the-counter Tylenol or ibuprofen every 6 hours, should your symptoms fail to resolve follow-up with her primary care provider or return to clinic

## 2016-08-01 NOTE — ED Provider Notes (Signed)
CSN: [id number]     Arrival date & time 08/01/16  1207 History   First MD Initiated Contact with Patient 08/01/16 1250     Chief Complaint  Patient presents with  . Sore Throat   (Consider location/radiation/quality/duration/timing/severity/associated sxs/prior Treatment) 41 year old female presents to clinic with a 48 hour history of cough and sore throat. She states her dentist prescribed her a Z-Pak 2 days ago for her symptoms, however this morning the dentist looked at her throat, and advised her to come to urgent care for further evaluation. She is on her second day of taking azithromycin.   The history is provided by the patient.  Sore Throat  This is a new problem. The current episode started 2 days ago. The problem occurs constantly. The problem has been gradually worsening. Pertinent negatives include no chest pain, no abdominal pain, no headaches and no shortness of breath. The symptoms are aggravated by swallowing, eating, drinking and coughing. Nothing relieves the symptoms. She has tried acetaminophen and water for the symptoms. The treatment provided no relief.    Past Medical History:  Diagnosis Date  . ADD (attention deficit disorder)   . Anxiety   . Anxiety   . Chronic knee pain   . Depression   . PTSD (post-traumatic stress disorder)    Past Surgical History:  Procedure Laterality Date  . CESAREAN SECTION     History reviewed. No pertinent family history. Social History  Substance Use Topics  . Smoking status: Never Smoker  . Smokeless tobacco: Never Used  . Alcohol use No   OB History    No data available     Review of Systems  Constitutional: Positive for fever. Negative for activity change, appetite change, chills and fatigue.  HENT: Positive for congestion and sore throat. Negative for ear discharge, ear pain, postnasal drip, rhinorrhea, sinus pain, sinus pressure and tinnitus.   Eyes: Negative.   Respiratory: Negative for cough and shortness of  breath.   Cardiovascular: Negative for chest pain and palpitations.  Gastrointestinal: Negative for abdominal pain, constipation, diarrhea, nausea and vomiting.  Endocrine: Negative.   Genitourinary: Negative for difficulty urinating, dysuria, frequency and urgency.  Musculoskeletal: Negative for arthralgias, myalgias, neck pain and neck stiffness.  Skin: Negative.  Negative for color change and rash.  Neurological: Negative for dizziness, tremors and headaches.    Allergies  Morphine and related  Home Medications   Prior to Admission medications   Medication Sig Start Date End Date Taking? Authorizing Provider  azithromycin (ZITHROMAX) 250 MG tablet Take by mouth daily.   Yes Historical Provider, MD  magic mouthwash w/lidocaine SOLN Take 5 mLs by mouth 3 (three) times daily as needed for mouth pain. 08/01/16   Dorena BodoLawrence Jamarques Pinedo, NP   Meds Ordered and Administered this Visit   Medications  penicillin g benzathine (BICILLIN LA) 1200000 UNIT/2ML injection 1.2 Million Units (1.2 Million Units Intramuscular Given 08/01/16 1325)    BP 120/64 (BP Location: Right Arm)   Pulse 99   Temp 98.9 F (37.2 C) (Oral)   Resp 18   SpO2 100%  No data found.   Physical Exam  Constitutional: She is oriented to person, place, and time. She appears well-developed and well-nourished. She does not have a sickly appearance. She does not appear ill. No distress.  HENT:  Head: Normocephalic and atraumatic.  Right Ear: Tympanic membrane and external ear normal.  Left Ear: Tympanic membrane and external ear normal.  Nose: Nose normal. Right sinus exhibits no maxillary sinus  tenderness and no frontal sinus tenderness. Left sinus exhibits no maxillary sinus tenderness and no frontal sinus tenderness.  Mouth/Throat: Uvula is midline and mucous membranes are normal. Posterior oropharyngeal edema and posterior oropharyngeal erythema present. No oropharyngeal exudate. Tonsils are 3+ on the right. Tonsils are 3+  on the left. Tonsillar exudate.  Eyes: Pupils are equal, round, and reactive to light.  Neck: Normal range of motion. Neck supple. No JVD present.  Cardiovascular: Normal rate and regular rhythm.   Pulmonary/Chest: Effort normal and breath sounds normal. No respiratory distress. She has no wheezes.  Abdominal: Soft. Bowel sounds are normal. She exhibits no distension. There is no tenderness. There is no guarding.  Lymphadenopathy:       Head (right side): Submandibular and tonsillar adenopathy present. No submental and no preauricular adenopathy present.       Head (left side): Submandibular and tonsillar adenopathy present. No submental and no preauricular adenopathy present.    She has no cervical adenopathy.  Neurological: She is alert and oriented to person, place, and time.  Skin: Skin is warm and dry. Capillary refill takes less than 2 seconds. She is not diaphoretic.  Psychiatric: She has a normal mood and affect.  Nursing note and vitals reviewed.   Urgent Care Course     Procedures (including critical care time)  Labs Review Labs Reviewed  POCT RAPID STREP A - Abnormal; Notable for the following:       Result Value   Streptococcus, Group A Screen (Direct) POSITIVE (*)    All other components within normal limits    Imaging Review No results found.    MDM   1. Strep pharyngitis    Patient requested injection of penicillin, she was advised about resistance patterns concerning strep, and it was advised against the injection. However she preferred that, so 1.2 million units of penicillin given in clinic. She was advised to finish her Z-Pak, and she is given prescription for magic mouthwash for pain. Advised to follow-up with primary care provider or return to clinic if symptoms do not improve.     Dorena Bodo, NP 08/01/16 [phone]

## 2016-08-01 NOTE — ED Triage Notes (Signed)
The [facility] with a complaint of a sore throat x 3 days. The patient stated that her dentist prescribed her a z-pack and she is on day # 2.

## 2016-11-08 ENCOUNTER — Encounter (HOSPITAL_COMMUNITY): Payer: Self-pay | Admitting: Emergency Medicine

## 2016-11-08 ENCOUNTER — Ambulatory Visit (HOSPITAL_COMMUNITY)
Admission: EM | Admit: 2016-11-08 | Discharge: 2016-11-08 | Disposition: A | Discharge status: Home / Self Care | Payer: Self-pay | Attending: Family Medicine | Admitting: Family Medicine

## 2016-11-08 DIAGNOSIS — J301 Allergic rhinitis due to pollen: Secondary | ICD-10-CM

## 2016-11-08 DIAGNOSIS — H73012 Bullous myringitis, left ear: Secondary | ICD-10-CM

## 2016-11-08 DIAGNOSIS — J320 Chronic maxillary sinusitis: Secondary | ICD-10-CM

## 2016-11-08 DIAGNOSIS — J029 Acute pharyngitis, unspecified: Secondary | ICD-10-CM

## 2016-11-08 MED ORDER — PREDNISONE 50 MG PO TABS: ORAL_TABLET | ORAL | 0 refills | Status: DC

## 2016-11-08 MED ORDER — METHYLPREDNISOLONE ACETATE 40 MG/ML IJ SUSP
INTRAMUSCULAR | Status: AC
Start: 2016-11-08 — End: 2016-11-08
  Filled 2016-11-08: qty 1

## 2016-11-08 MED ORDER — METHYLPREDNISOLONE ACETATE 80 MG/ML IJ SUSP
INTRAMUSCULAR | Status: AC
  Filled 2016-11-08: qty 1

## 2016-11-08 MED ORDER — METHYLPREDNISOLONE ACETATE 40 MG/ML IJ SUSP
40.0000 mg | Freq: Once | INTRAMUSCULAR | Status: AC
  Administered 2016-11-08: 40 mg via INTRAMUSCULAR

## 2016-11-08 MED ORDER — DEXAMETHASONE SODIUM PHOSPHATE 10 MG/ML IJ SOLN
INTRAMUSCULAR | Status: AC
  Filled 2016-11-08: qty 1

## 2016-11-08 MED ORDER — AMOXICILLIN 500 MG PO CAPS
1000.0000 mg | ORAL_CAPSULE | Freq: Two times a day (BID) | ORAL | 0 refills | Status: DC
Start: 2016-11-08 — End: 2017-02-27

## 2016-11-08 MED ORDER — DEXAMETHASONE SODIUM PHOSPHATE 10 MG/ML IJ SOLN
10.0000 mg | Freq: Once | INTRAMUSCULAR | Status: AC
  Administered 2016-11-08: 10 mg via INTRAMUSCULAR

## 2016-11-08 NOTE — ED Provider Notes (Signed)
CSN: [id number]     Arrival date & time 11/08/16  1256 History   First MD Initiated Contact with Patient 11/08/16 1405     Chief Complaint  Patient presents with  . Cough  . Sore Throat   (Consider location/radiation/quality/duration/timing/severity/associated sxs/prior Treatment) 41 year old female complaining of a one-week history of sore throat, upper respiratory congestion, sinus pain, left earache, fatigue and malaise. She was initially prescribed a Z-Pak, 2-5 days with no relief. She continues to have increasingly left earache and upper respiratory symptoms. She started taking amoxicillin yesterday. Other over-the-counter medicines include Sudafed PE with combination medications within the tablet. Mucinex, lozenges, and other allergy medications and Robitussin.      Past Medical History:  Diagnosis Date  . ADD (attention deficit disorder)   . Anxiety   . Anxiety   . Chronic knee pain   . Depression   . PTSD (post-traumatic stress disorder)    Past Surgical History:  Procedure Laterality Date  . CESAREAN SECTION     History reviewed. No pertinent family history. Social History  Substance Use Topics  . Smoking status: Never Smoker  . Smokeless tobacco: Never Used  . Alcohol use No   OB History    No data available     Review of Systems  Constitutional: Positive for activity change and fatigue. Negative for appetite change, chills and fever.  HENT: Positive for congestion, ear pain, postnasal drip, rhinorrhea, sneezing and sore throat. Negative for facial swelling.   Eyes: Negative.   Respiratory: Positive for cough. Negative for shortness of breath.   Cardiovascular: Negative.   Gastrointestinal: Negative.   Musculoskeletal: Negative for neck pain and neck stiffness.  Skin: Negative for pallor and rash.  Neurological: Negative.   All other systems reviewed and are negative.   Allergies  Morphine and related  Home Medications   Prior to Admission  medications   Medication Sig Start Date End Date Taking? Authorizing Provider  amoxicillin (AMOXIL) 500 MG capsule Take 2 capsules (1,000 mg total) by mouth 2 (two) times daily. Pt already has 7 days of tx. 11/08/16   Hayden RasmussenMabe, Kellsie Grindle, NP  predniSONE (DELTASONE) 50 MG tablet 1 tab po daily for 6 days. Take with food. Start 11/09/16. 11/08/16   Hayden RasmussenMabe, Rosamary Boudreau, NP   Meds Ordered and Administered this Visit   Medications  dexamethasone (DECADRON) injection 10 mg (not administered)  methylPREDNISolone acetate (DEPO-MEDROL) injection 40 mg (not administered)    BP 138/63 (BP Location: Right Arm)   Pulse 70   Temp 98.8 F (37.1 C) (Oral)   Resp 18   SpO2 99%  No data found.   Physical Exam  Constitutional: She is oriented to person, place, and time. She appears well-developed and well-nourished. No distress.  HENT:  Right TM mildly retracted. Left TM with bullae, erythema, bulging and pallor in the lower half representing effusion.  Oropharynx with minimal erythema. No exudates or swelling.  Eyes: EOM are normal.  Neck: Normal range of motion. Neck supple.  Cardiovascular: Normal rate, regular rhythm, normal heart sounds and intact distal pulses.   Pulmonary/Chest: Effort normal and breath sounds normal. No respiratory distress. She has no wheezes.  Musculoskeletal: Normal range of motion. She exhibits no edema.  Lymphadenopathy:    She has no cervical adenopathy.  Neurological: She is alert and oriented to person, place, and time.  Skin: Skin is warm and dry. Capillary refill takes less than 2 seconds. No rash noted.  Psychiatric: She has a normal mood and affect.  Her behavior is normal.  Nursing note and vitals reviewed.   Urgent Care Course     Procedures (including critical care time)  Labs Review Labs Reviewed - No data to display  Imaging Review No results found.   Visual Acuity Review  Right Eye Distance:   Left Eye Distance:   Bilateral Distance:    Right Eye Near:    Left Eye Near:    Bilateral Near:         MDM   1. Seasonal allergic rhinitis due to pollen   2. Bullous myringitis of left ear   3. Pharyngitis, unspecified etiology   4. Chronic maxillary sinusitis    Take the amoxicillin as directed for 10 days. Take the prednisone as directed starting tomorrow and take with food. Recommend not taking ibuprofen at the same time you are taking the prednisone. He may also continue taking the same decongestant, phenylephrine (Sudafed PE 10 mg) for decongestant. The following medicines also may be of help. Sudafed PE 10 mg every 4 to 6 hours as needed for congestion Allegra or Zyrtec daily as needed for drainage and runny nose. For stronger antihistamine may take Chlor-Trimeton 2 to 4 mg every 4 to 6 hours, may cause drowsiness. Saline nasal spray used frequently. Drink plenty of fluids and stay well-hydrated. Flonase or Rhinocort nasal spray daily     Hayden Rasmussen, NP 11/08/16 1439

## 2016-11-08 NOTE — Discharge Instructions (Signed)
Take the amoxicillin as directed for 10 days. Take the prednisone as directed starting tomorrow and take with food. Recommend not taking ibuprofen at the same time you are taking the prednisone. He may also continue taking the same decongestant, phenylephrine (Sudafed PE 10 mg) for decongestant. The following medicines also may be of help. Sudafed PE 10 mg every 4 to 6 hours as needed for congestion Allegra or Zyrtec daily as needed for drainage and runny nose. For stronger antihistamine may take Chlor-Trimeton 2 to 4 mg every 4 to 6 hours, may cause drowsiness. Saline nasal spray used frequently. Drink plenty of fluids and stay well-hydrated. Flonase or Rhinocort nasal spray daily

## 2016-11-08 NOTE — ED Triage Notes (Signed)
The patient presented to the [facility] with a complaint of a cough, sinus pain and pressure and a sore throat x 1 week. The patient reported completing a Z-Pac and starting Amoxacillin yesterday.

## 2017-02-27 ENCOUNTER — Encounter (HOSPITAL_COMMUNITY): Payer: Self-pay | Admitting: Emergency Medicine

## 2017-02-27 ENCOUNTER — Emergency Department (HOSPITAL_COMMUNITY): Payer: Self-pay

## 2017-02-27 ENCOUNTER — Other Ambulatory Visit (HOSPITAL_COMMUNITY): Payer: Self-pay

## 2017-02-27 ENCOUNTER — Emergency Department (HOSPITAL_COMMUNITY)
Admission: EM | Admit: 2017-02-27 | Discharge: 2017-02-27 | Disposition: A | Discharge status: Home / Self Care | Payer: Self-pay | Attending: Physician Assistant | Admitting: Physician Assistant

## 2017-02-27 DIAGNOSIS — F909 Attention-deficit hyperactivity disorder, unspecified type: Secondary | ICD-10-CM | POA: Insufficient documentation

## 2017-02-27 DIAGNOSIS — K2900 Acute gastritis without bleeding: Secondary | ICD-10-CM | POA: Insufficient documentation

## 2017-02-27 LAB — COMPREHENSIVE METABOLIC PANEL
ALK PHOS: 94 U/L (ref 38–126)
ALT: 16 U/L (ref 14–54)
ANION GAP: 6 (ref 5–15)
AST: 18 U/L (ref 15–41)
Albumin: 3.3 g/dL — ABNORMAL LOW (ref 3.5–5.0)
BILIRUBIN TOTAL: 0.3 mg/dL (ref 0.3–1.2)
BUN: 9 mg/dL (ref 6–20)
CALCIUM: 8.8 mg/dL — AB (ref 8.9–10.3)
CO2: 25 mmol/L (ref 22–32)
Chloride: 107 mmol/L (ref 101–111)
Creatinine, Ser: 0.83 mg/dL (ref 0.44–1.00)
GFR calc non Af Amer: >60 mL/min (ref >60)
GLUCOSE: 97 mg/dL (ref 65–99)
Potassium: 3.5 mmol/L (ref 3.5–5.1)
Sodium: 138 mmol/L (ref 135–145)
TOTAL PROTEIN: 6.8 g/dL (ref 6.5–8.1)

## 2017-02-27 LAB — CBC
HCT: 39.7 % (ref 36.0–46.0)
HEMOGLOBIN: 12.8 g/dL (ref 12.0–15.0)
MCH: 28.5 pg (ref 26.0–34.0)
MCHC: 32.2 g/dL (ref 30.0–36.0)
MCV: 88.4 fL (ref 78.0–100.0)
PLATELETS: 322 10*3/uL (ref 150–400)
RBC: 4.49 MIL/uL (ref 3.87–5.11)
RDW: 13.2 % (ref 11.5–15.5)
WBC: 7.1 10*3/uL (ref 4.0–10.5)

## 2017-02-27 LAB — LIPASE, BLOOD: Lipase: 23 U/L (ref 11–51)

## 2017-02-27 LAB — I-STAT TROPONIN, ED: Troponin i, poc: 0 ng/mL (ref 0.00–0.08)

## 2017-02-27 LAB — I-STAT BETA HCG BLOOD, ED (MC, WL, AP ONLY)

## 2017-02-27 MED ORDER — ONDANSETRON 4 MG PO TBDP
ORAL_TABLET | ORAL | Status: AC
  Filled 2017-02-27: qty 1

## 2017-02-27 MED ORDER — SUCRALFATE 1 G PO TABS: 1.0000 g | ORAL_TABLET | Freq: Three times a day (TID) | ORAL | 0 refills | Status: DC

## 2017-02-27 MED ORDER — OMEPRAZOLE 20 MG PO CPDR: 20.0000 mg | DELAYED_RELEASE_CAPSULE | Freq: Two times a day (BID) | ORAL | 0 refills | Status: DC

## 2017-02-27 MED ORDER — AMOXICILLIN 500 MG PO CAPS: 1000.0000 mg | ORAL_CAPSULE | Freq: Two times a day (BID) | ORAL | 0 refills | Status: DC

## 2017-02-27 MED ORDER — ONDANSETRON 4 MG PO TBDP
4.0000 mg | ORAL_TABLET | Freq: Once | ORAL | Status: AC
  Administered 2017-02-27: 4 mg via ORAL

## 2017-02-27 MED ORDER — FAMOTIDINE IN NACL 20-0.9 MG/50ML-% IV SOLN
20.0000 mg | Freq: Once | INTRAVENOUS | Status: AC
  Administered 2017-02-27: 20 mg via INTRAVENOUS
  Filled 2017-02-27: qty 50

## 2017-02-27 MED ORDER — CLARITHROMYCIN 500 MG PO TABS: 500.0000 mg | ORAL_TABLET | Freq: Two times a day (BID) | ORAL | 0 refills | Status: DC

## 2017-02-27 NOTE — ED Notes (Signed)
ED Provider at bedside. 

## 2017-02-27 NOTE — ED Notes (Signed)
Pt reports 10/10 pain, will notify EDP

## 2017-02-27 NOTE — ED Provider Notes (Signed)
[facility] EMERGENCY DEPARTMENT Provider Note   CSN: [id number] Arrival date & time: 02/27/17  0204     History   Chief Complaint Chief Complaint  Patient presents with  . Abdominal Pain    HPI Heidi Pearson is a 41 y.o. female.  Patient presents to the emergency department for evaluation of abdominal pain. Patient reports that she has been experiencing a burning and cramping pain in her upper abdomen. She does report that she has been unable to eat because the pain worsens she gets nauseated. She has not had vomiting, hematemesis, rectal bleeding, melanotic stools. No chest pain or shortness of breath. Patient reports that she had similar symptoms 5 years ago when she lived in [place]. 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      Past Medical History:  Diagnosis Date  . ADD (attention deficit disorder)   . Anxiety   . Anxiety   . Chronic knee pain   . Depression   . PTSD (post-traumatic stress disorder)     Patient Active Problem List   Diagnosis Date Noted  . Alcohol dependence (HCC) 12/12/2013  . ADHD (attention deficit hyperactivity disorder) 10/09/2012  . Panic attacks 10/08/2012  . PTSD (post-traumatic stress disorder) 10/08/2012  . MDD (major depressive disorder) 10/08/2012  . Chronic knee pain   . Anxiety     Past Surgical History:  Procedure Laterality Date  . CESAREAN SECTION      OB History    No data available       Home Medications    Prior to Admission medications   Medication Sig Start Date End Date Taking? Authorizing Provider  amoxicillin (AMOXIL) 500 MG capsule Take 2 capsules (1,000 mg total) by mouth 2 (two) times daily. Pt already has 7 days of tx. 11/08/16   Hayden Rasmussen, NP  predniSONE (DELTASONE) 50 MG tablet 1 tab po daily for 6 days. Take with food. Start 11/09/16. 11/08/16   Hayden Rasmussen, NP    Family History History reviewed. No pertinent family history.  Social History Social  History  Substance Use Topics  . Smoking status: Never Smoker  . Smokeless tobacco: Never Used  . Alcohol use No     Allergies   Morphine and related   Review of Systems Review of Systems  Gastrointestinal: Positive for abdominal pain.  All other systems reviewed and are negative.    Physical Exam Updated Vital Signs BP 124/72 (BP Location: Right Arm)   Pulse 73   Temp 98.4 F (36.9 C) (Oral)   Resp 16   Ht  (1.6 m)   Wt 85.3 kg (188 lb)   SpO2 98%   BMI 33.30 kg/m   Physical Exam  Constitutional: She is oriented to person, place, and time. She appears well-developed and well-nourished. No distress.  HENT:  Head: Normocephalic and atraumatic.  Right Ear: Hearing normal.  Left Ear: Hearing normal.  Nose: Nose normal.  Mouth/Throat: Oropharynx is clear and moist and mucous membranes are normal.  Eyes: Pupils are equal, round, and reactive to light. Conjunctivae and EOM are normal.  Neck: Normal range of motion. Neck supple.  Cardiovascular: Regular rhythm, S1 normal and S2 normal.  Exam reveals no gallop and no friction rub.   No murmur heard. Pulmonary/Chest: Effort normal and breath sounds normal. No respiratory distress. She exhibits no tenderness.  Abdominal: Soft. Normal appearance and bowel sounds are normal. There is no hepatosplenomegaly. There is tenderness in the right  upper quadrant and epigastric area. There is no rebound, no guarding, no tenderness at McBurney's point and negative Murphy's sign. No hernia.  Musculoskeletal: Normal range of motion.  Neurological: She is alert and oriented to person, place, and time. She has normal strength. No cranial nerve deficit or sensory deficit. Coordination normal. GCS eye subscore is 4. GCS verbal subscore is 5. GCS motor subscore is 6.  Skin: Skin is warm, dry and intact. No rash noted. No cyanosis.  Psychiatric: She has a normal mood and affect. Her speech is normal and behavior is normal. Thought content  normal.  Nursing note and vitals reviewed.    ED Treatments / Results  Labs (all labs ordered are listed, but only abnormal results are displayed) Labs Reviewed  COMPREHENSIVE METABOLIC PANEL - Abnormal; Notable for the following:       Result Value   Calcium 8.8 (*)    Albumin 3.3 (*)    All other components within normal limits  LIPASE, BLOOD  CBC  I-STAT BETA HCG BLOOD, ED ([facility], [facility], [facility] ONLY)  I-STAT TROPONIN, ED    EKG  EKG Interpretation  Date/Time:  Tuesday February 27 2017 02:15:59 EDT Ventricular Rate:  82 PR Interval:  172 QRS Duration: 84 QT Interval:  366 QTC Calculation: 427 R Axis:   40 Text Interpretation:  Poor data quality, interpretation may be adversely affected Normal sinus rhythm Septal infarct , age undetermined Abnormal ECG Confirmed by Gilda Crease [phone]) on 02/27/2017 5:32:12 AM       Radiology No results found.  Procedures Procedures (including critical care time)  Medications Ordered in ED Medications  ondansetron (ZOFRAN-ODT) disintegrating tablet 4 mg (not administered)  ondansetron (ZOFRAN-ODT) 4 MG disintegrating tablet (not administered)  famotidine (PEPCID) IVPB 20 mg premix (not administered)     Initial Impression / Assessment and Plan / ED Course  I have reviewed the triage vital signs and the nursing notes.  Pertinent labs & imaging results that were available during my care of the patient were reviewed by me and considered in my medical decision making (see chart for details).     Patient presents with upper abdominal discomfort that is similar to when she had peptic ulcer disease secondary to H. pylori 5 years ago. Patient does not have any anemia or abnormal vital signs. Symptoms likely secondary to recurrent disease. She does, however, have right upper quadrant tenderness, but no obvious Murphy sign. Patient will undergo right upper quadrant ultrasound. If negative, discharge with empiric H. pylori treatment and  follow-up with gastroenterology. Will sign out to oncoming ER physician to follow-up ultrasound.  Final Clinical Impressions(s) / ED Diagnoses   Final diagnoses:  Acute gastritis without hemorrhage, unspecified gastritis type    New Prescriptions New Prescriptions   No medications on file     Gilda Crease, MD 02/27/17 [phone]

## 2017-02-27 NOTE — ED Triage Notes (Signed)
Pt presents to ED for assessment of epigastric burning and pain, "that feeling when you're really hungry", lower back pain and intermittent nausea since Friday, with increasing pain, and worsening nausea.  Hx of gastric ulcers with hospital admission.

## 2017-02-27 NOTE — ED Notes (Signed)
Patient transported to Ultrasound 

## 2017-03-01 ENCOUNTER — Encounter (HOSPITAL_COMMUNITY): Payer: Self-pay | Admitting: Emergency Medicine

## 2017-03-01 ENCOUNTER — Ambulatory Visit (HOSPITAL_COMMUNITY)
Admission: EM | Admit: 2017-03-01 | Discharge: 2017-03-01 | Disposition: A | Discharge status: Home / Self Care | Payer: Self-pay | Attending: Emergency Medicine | Admitting: Emergency Medicine

## 2017-03-01 DIAGNOSIS — K219 Gastro-esophageal reflux disease without esophagitis: Secondary | ICD-10-CM

## 2017-03-01 DIAGNOSIS — R1013 Epigastric pain: Secondary | ICD-10-CM

## 2017-03-01 DIAGNOSIS — G43009 Migraine without aura, not intractable, without status migrainosus: Secondary | ICD-10-CM

## 2017-03-01 DIAGNOSIS — K2900 Acute gastritis without bleeding: Secondary | ICD-10-CM

## 2017-03-01 DIAGNOSIS — G44209 Tension-type headache, unspecified, not intractable: Secondary | ICD-10-CM

## 2017-03-01 MED ORDER — KETOROLAC TROMETHAMINE 60 MG/2ML IM SOLN
45.0000 mg | Freq: Once | INTRAMUSCULAR | Status: AC
  Administered 2017-03-01: 45 mg via INTRAMUSCULAR

## 2017-03-01 MED ORDER — DEXAMETHASONE SODIUM PHOSPHATE 10 MG/ML IJ SOLN
INTRAMUSCULAR | Status: AC
  Filled 2017-03-01: qty 1

## 2017-03-01 MED ORDER — ONDANSETRON 4 MG PO TBDP
ORAL_TABLET | ORAL | Status: AC
  Filled 2017-03-01: qty 1

## 2017-03-01 MED ORDER — DEXAMETHASONE SODIUM PHOSPHATE 10 MG/ML IJ SOLN
10.0000 mg | Freq: Once | INTRAMUSCULAR | Status: AC
  Administered 2017-03-01: 10 mg via INTRAMUSCULAR

## 2017-03-01 MED ORDER — GI COCKTAIL ~~LOC~~
ORAL | Status: AC
  Filled 2017-03-01: qty 30

## 2017-03-01 MED ORDER — ONDANSETRON 4 MG PO TBDP
4.0000 mg | ORAL_TABLET | Freq: Once | ORAL | Status: AC
  Administered 2017-03-01: 4 mg via ORAL

## 2017-03-01 MED ORDER — KETOROLAC TROMETHAMINE 60 MG/2ML IM SOLN
INTRAMUSCULAR | Status: AC
  Filled 2017-03-01: qty 2

## 2017-03-01 MED ORDER — GI COCKTAIL ~~LOC~~
30.0000 mL | Freq: Once | ORAL | Status: AC
  Administered 2017-03-01: 30 mL via ORAL

## 2017-03-01 NOTE — ED Provider Notes (Signed)
[facility]    CSN: [id number] Arrival date & time: 03/01/17  1006     History   Chief Complaint Chief Complaint  Patient presents with  . Abdominal Pain    HPI Heidi Pearson is a 41 y.o. female.   41 year old obese female possessed to the urgent care with 2 complaints. The first one is that of epigastric pain for which she was evaluated 2 days ago in the emergency department.she underwent several labsas well as gallbladder ultrasound. All of these were found unremarkable or normal. Her diagnosis was gastritis without bleeding. She has a history of a similar problem several years ago in which she was positive for H. Pylori. At that time she underwent endoscopy.  She was treated in the emergency department 2 days ago with H. Pylori combination medication to include the Prilosec. In addition Carafate. There is some question as to whether she is actually taking the Carafate. The pain is about the same. Location, intensity and character similar. She is complaining of reflux symptoms belching and heartburn at the esophagus.States that she was given a referral to gastroenterology and plans to call today to make an appointment.  Second complaint is that of a headache, primarily left hemicranial. There is left lateral neck discomfort that radiates to the left parietal scalp and forehead. She has a history of migraine type headaches over 20 years ago and received several types of management/treatments including local injections and other medications. These headaches seem to have abated on their own after your time and she has not had headaches in several years.for her headache she has taken ibuprofen and aspirin even though she was told not to take those medications. This may be exacerbating her epigastric symptoms.      Past Medical History:  Diagnosis Date  . ADD (attention deficit disorder)   . Anxiety   . Anxiety   . Chronic knee pain   . Depression   . PTSD  (post-traumatic stress disorder)     Patient Active Problem List   Diagnosis Date Noted  . Alcohol dependence (HCC) 12/12/2013  . ADHD (attention deficit hyperactivity disorder) 10/09/2012  . Panic attacks 10/08/2012  . PTSD (post-traumatic stress disorder) 10/08/2012  . MDD (major depressive disorder) 10/08/2012  . Chronic knee pain   . Anxiety     Past Surgical History:  Procedure Laterality Date  . CESAREAN SECTION      OB History    No data available       Home Medications    Prior to Admission medications   Medication Sig Start Date End Date Taking? Authorizing Provider  medroxyPROGESTERone (DEPO-PROVERA) 150 MG/ML injection Inject 150 mg into the muscle every 3 (three) months.   Yes [provider]  amoxicillin (AMOXIL) 500 MG capsule Take 2 capsules (1,000 mg total) by mouth 2 (two) times daily. 02/27/17   Gilda Crease, MD  clarithromycin (BIAXIN) 500 MG tablet Take 1 tablet (500 mg total) by mouth 2 (two) times daily. 02/27/17   Gilda Crease, MD  omeprazole (PRILOSEC) 20 MG capsule Take 1 capsule (20 mg total) by mouth 2 (two) times daily before a meal. 02/27/17   Pollina, Canary Brim, MD  sucralfate (CARAFATE) 1 g tablet Take 1 tablet (1 g total) by mouth 4 (four) times daily -  with meals and at bedtime. 02/27/17   Gilda Crease, MD    Family History History reviewed. No pertinent family history.  Social History Social History  Substance Use  Topics  . Smoking status: Never Smoker  . Smokeless tobacco: Never Used  . Alcohol use No     Allergies   Morphine and related   Review of Systems Review of Systems  Constitutional: Positive for activity change. Negative for chills, diaphoresis and fever.  HENT: Negative for ear pain, hearing loss, nosebleeds, sore throat and tinnitus.   Eyes:       See also HPI Occasional blurring of vision.  Respiratory: Negative for cough and shortness of breath.   Cardiovascular:  Negative for chest pain, palpitations and leg swelling.  Gastrointestinal: Positive for nausea. Negative for abdominal pain, blood in stool and vomiting.       Denies bleeding, hematochezia, melena or bright red bleeding.  Genitourinary: Negative.   Skin: Negative for rash.  Neurological: Positive for headaches. Negative for dizziness, syncope and speech difficulty.       See also HPI  All other systems reviewed and are negative.    Physical Exam Triage Vital Signs ED Triage Vitals  Enc Vitals Group     BP 03/01/17 1040 123/66     Pulse Rate 03/01/17 1040 98     Resp 03/01/17 1040 16     Temp 03/01/17 1040 98 F (36.7 C)     Temp Source 03/01/17 1040 Oral     SpO2 03/01/17 1040 98 %     Weight --      Height --      Head Circumference --      Peak Flow --      Pain Score 03/01/17 1043 7     Pain Loc --      Pain Edu? --      Excl. in GC? --    No data found.   Updated Vital Signs BP 123/66 (BP Location: Left Arm)   Pulse 98   Temp 98 F (36.7 C) (Oral)   Resp 16   LMP 03/01/2017   SpO2 98%   Visual Acuity Right Eye Distance:   Left Eye Distance:   Bilateral Distance:    Right Eye Near:   Left Eye Near:    Bilateral Near:     Physical Exam  Constitutional: She is oriented to person, place, and time. She appears well-developed and well-nourished. No distress.  HENT:  Head: Normocephalic and atraumatic.  Right Ear: External ear normal.  Left Ear: External ear normal.  Mouth/Throat: Oropharynx is clear and moist. No oropharyngeal exudate.  Soft palate rises symmetrically. Tongue and uvula midline. Face is symmetric.  Eyes: Pupils are equal, round, and reactive to light. Conjunctivae and EOM are normal. Right eye exhibits no discharge. Left eye exhibits no discharge.  Neck: Normal range of motion. Neck supple.  Cardiovascular: Normal rate, regular rhythm, normal heart sounds and intact distal pulses.   Pulmonary/Chest: Effort normal and breath sounds normal.  No respiratory distress. She has no wheezes. She has no rales.  Abdominal: She exhibits no distension. There is no tenderness.  Abdomen soft but greatest amount tenderness in the epigastrium and in the midline from the umbilicus to the epigastrium. No rebound or guarding. No other areas of abdominal tenderness. No masses.  Musculoskeletal: Normal range of motion. She exhibits no edema or tenderness.  Lymphadenopathy:    She has no cervical adenopathy.  Neurological: She is alert and oriented to person, place, and time. She has normal strength. She displays no tremor. No cranial nerve deficit or sensory deficit. She exhibits normal muscle tone. Coordination and gait normal.  GCS eye subscore is 4. GCS verbal subscore is 5. GCS motor subscore is 6.  Skin: Skin is warm and dry. No rash noted.  Psychiatric: She has a normal mood and affect.  Nursing note and vitals reviewed.    UC Treatments / Results  Labs (all labs ordered are listed, but only abnormal results are displayed) Labs Reviewed - No data to display  EKG  EKG Interpretation None       Radiology No results found.  Procedures Procedures (including critical care time)  Medications Ordered in UC Medications  ketorolac (TORADOL) injection 45 mg (45 mg Intramuscular Given 03/01/17 1138)  dexamethasone (DECADRON) injection 10 mg (10 mg Intramuscular Given 03/01/17 1139)  ondansetron (ZOFRAN-ODT) disintegrating tablet 4 mg (4 mg Oral Given 03/01/17 1138)  gi cocktail (Maalox,Lidocaine,Donnatal) (30 mLs Oral Given 03/01/17 1138)     Initial Impression / Assessment and Plan / UC Course  I have reviewed the triage vital signs and the nursing notes.  Pertinent labs & imaging results that were available during my care of the patient were reviewed by me and considered in my medical decision making (see chart for details).    Continue taking the medication prescribed in the emergency department. Be sure you are taking the  Carafate tablets to help coat the esophagus and stomach from the effects of acid.recommend do not take aspirin or ibuprofen.be sure to call the gastroenterologist today for appointment as soon as possible. It is recommended to obtain a primary care provider as soon as possible. If you are getting worse, abdominal pain is getting worse or headache is getting worse, you see blood or dark tarry stools, gets sweaty or fainty or other problems go back to the emergency department or call 911.   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 Not Applicable   Hayden Rasmussen, NP 03/01/17 1145

## 2017-03-01 NOTE — ED Triage Notes (Signed)
Pt c/o constant epigastric pain onset 7 days.... [facility] sx  Reports pain increases when she eats.... Sts she has now developed a constant HA  Taking ibuprofen w/no relief but was told to avoid Ibup  Sx also include: nausea  Denies fevers, vomiting, diarrhea   A&O x4... NAD... Ambulatory

## 2017-03-01 NOTE — Discharge Instructions (Signed)
Continue taking the medication prescribed in the emergency department. Be sure you are taking the Carafate tablets to help coat the esophagus and stomach from the effects of acid.recommend do not take aspirin or ibuprofen.be sure to call the gastroenterologist today for appointment as soon as possible. It is recommended to obtain a primary care provider as soon as possible. If you are getting worse, abdominal pain is getting worse or headache is getting worse, you see blood or dark tarry stools, gets sweaty or fainty or other problems go back to the emergency department or call 911.

## 2017-04-19 ENCOUNTER — Emergency Department (HOSPITAL_COMMUNITY)
Admission: EM | Admit: 2017-04-19 | Discharge: 2017-04-19 | Disposition: A | Discharge status: Home / Self Care | Payer: Self-pay | Attending: Emergency Medicine | Admitting: Emergency Medicine

## 2017-04-19 ENCOUNTER — Other Ambulatory Visit: Payer: Self-pay

## 2017-04-19 ENCOUNTER — Encounter (HOSPITAL_COMMUNITY): Payer: Self-pay | Admitting: Nurse Practitioner

## 2017-04-19 DIAGNOSIS — K2900 Acute gastritis without bleeding: Secondary | ICD-10-CM | POA: Insufficient documentation

## 2017-04-19 DIAGNOSIS — Z79899 Other long term (current) drug therapy: Secondary | ICD-10-CM | POA: Insufficient documentation

## 2017-04-19 LAB — COMPREHENSIVE METABOLIC PANEL
ALT: 16 U/L (ref 14–54)
ANION GAP: 8 (ref 5–15)
AST: 19 U/L (ref 15–41)
Albumin: 4 g/dL (ref 3.5–5.0)
Alkaline Phosphatase: 83 U/L (ref 38–126)
BUN: 12 mg/dL (ref 6–20)
CALCIUM: 9.1 mg/dL (ref 8.9–10.3)
CHLORIDE: 107 mmol/L (ref 101–111)
CO2: 22 mmol/L (ref 22–32)
Creatinine, Ser: 0.66 mg/dL (ref 0.44–1.00)
Glucose, Bld: 104 mg/dL — ABNORMAL HIGH (ref 65–99)
Potassium: 3.9 mmol/L (ref 3.5–5.1)
SODIUM: 137 mmol/L (ref 135–145)
Total Bilirubin: 0.7 mg/dL (ref 0.3–1.2)
Total Protein: 8 g/dL (ref 6.5–8.1)

## 2017-04-19 LAB — CBC
HCT: 40.5 % (ref 36.0–46.0)
HEMOGLOBIN: 13.4 g/dL (ref 12.0–15.0)
MCH: 29 pg (ref 26.0–34.0)
MCHC: 33.1 g/dL (ref 30.0–36.0)
MCV: 87.7 fL (ref 78.0–100.0)
PLATELETS: 316 10*3/uL (ref 150–400)
RBC: 4.62 MIL/uL (ref 3.87–5.11)
RDW: 13.4 % (ref 11.5–15.5)
WBC: 9.2 10*3/uL (ref 4.0–10.5)

## 2017-04-19 LAB — I-STAT BETA HCG BLOOD, ED (MC, WL, AP ONLY)

## 2017-04-19 LAB — LIPASE, BLOOD: LIPASE: 25 U/L (ref 11–51)

## 2017-04-19 MED ORDER — GI COCKTAIL ~~LOC~~
30.0000 mL | Freq: Once | ORAL | Status: AC
  Administered 2017-04-19: 30 mL via ORAL
  Filled 2017-04-19: qty 30

## 2017-04-19 MED ORDER — ONDANSETRON HCL 4 MG/2ML IJ SOLN: 4.0000 mg | Freq: Once | INTRAMUSCULAR | Status: DC | PRN

## 2017-04-19 MED ORDER — OMEPRAZOLE 20 MG PO CPDR: 20.0000 mg | DELAYED_RELEASE_CAPSULE | Freq: Two times a day (BID) | ORAL | 0 refills | Status: DC

## 2017-04-19 MED ORDER — ONDANSETRON HCL 4 MG PO TABS: 4.0000 mg | ORAL_TABLET | Freq: Four times a day (QID) | ORAL | 0 refills | Status: DC | PRN

## 2017-04-19 NOTE — ED Notes (Signed)
Pt is aware a urine sample is needed, but voided her bladder recently without obtaining a sample. Pt will provide one when able.

## 2017-04-19 NOTE — ED Triage Notes (Signed)
Patient presents with c/o generalized abdominal pain that radiates to her back. Denies any fever, chills, or urinary symptoms. Reports this happened about 3 weeks ago and lasted a day and then resolved. Reports pain is worst today and when she was driving to work she got really nauseated and threw up yellow content. Reports pain is intermittent and at this time she is not in any pain, however concerned that it will come back. Patient is ambulatory and can speak in complete sentences. Denies any OTC medications for her pains or nausea.

## 2017-04-19 NOTE — ED Provider Notes (Signed)
[facility]-EMERGENCY DEPT Provider Note   CSN: [id number] Arrival date & time: 04/19/17  0850     History   Chief Complaint Chief Complaint  Patient presents with  . Abdominal Pain    HPI Heidi Pearson is a 41 y.o. female.  HPI Patient presents with upper abdominal pain radiating to her back.  This started this morning while driving to work.  Describes the pain as sharp.  Had episode of nausea and vomiting.  Vomitus was yellow in color.  States the pain has since subsided.  No fever or chills.  States she did not eat anything this morning.  She did take 600 mg of Motrin yesterday for ear pain.  Denies diarrhea or constipation.  No blood in the stool.  Patient states she is been diagnosed with gastric ulcers in the past.  She has not been taking Prilosec as previously prescribed. Past Medical History:  Diagnosis Date  . ADD (attention deficit disorder)   . Anxiety   . Anxiety   . Chronic knee pain   . Depression   . PTSD (post-traumatic stress disorder)     Patient Active Problem List   Diagnosis Date Noted  . Alcohol dependence (HCC) 12/12/2013  . ADHD (attention deficit hyperactivity disorder) 10/09/2012  . Panic attacks 10/08/2012  . PTSD (post-traumatic stress disorder) 10/08/2012  . MDD (major depressive disorder) 10/08/2012  . Chronic knee pain   . Anxiety     Past Surgical History:  Procedure Laterality Date  . CESAREAN SECTION      OB History    No data available       Home Medications    Prior to Admission medications   Medication Sig Start Date End Date Taking? Authorizing Provider  acetaminophen (TYLENOL) 500 MG tablet Take 500 mg by mouth every 6 (six) hours as needed for mild pain, moderate pain, fever or headache.   Yes [provider]  Cholecalciferol (VITAMIN D PO) Take 1 tablet by mouth daily.   Yes [provider]  loratadine (CLARITIN) 10 MG tablet Take 10 mg by mouth daily.   Yes [provider]  medroxyPROGESTERone (DEPO-PROVERA) 150 MG/ML injection Inject 150 mg into the muscle every 3 (three) months.   Yes [provider]  Multiple Vitamins-Minerals (ZINC PO) Take 1 tablet by mouth daily.   Yes [provider]  amoxicillin (AMOXIL) 500 MG capsule Take 2 capsules (1,000 mg total) by mouth 2 (two) times daily. Patient not taking: Reported on 04/19/2017 02/27/17   Gilda CreasePollina, Christopher J, MD  clarithromycin (BIAXIN) 500 MG tablet Take 1 tablet (500 mg total) by mouth 2 (two) times daily. Patient not taking: Reported on 04/19/2017 02/27/17   Gilda CreasePollina, Christopher J, MD  omeprazole (PRILOSEC) 20 MG capsule Take 1 capsule (20 mg total) by mouth 2 (two) times daily before a meal. 04/19/17   Loren RacerYelverton, Dorothy Polhemus, MD  ondansetron (ZOFRAN) 4 MG tablet Take 1 tablet (4 mg total) by mouth 4 (four) times daily as needed for nausea or vomiting. 04/19/17   Loren RacerYelverton, Sheala Dosh, MD  sucralfate (CARAFATE) 1 g tablet Take 1 tablet (1 g total) by mouth 4 (four) times daily -  with meals and at bedtime. Patient not taking: Reported on 04/19/2017 02/27/17   Gilda CreasePollina, Christopher J, MD    Family History Family History  Problem Relation Age of Onset  . Hypertension Mother   . Cancer Mother   . Hypertension Father     Social History Social History  Tobacco Use  . Smoking status: Never Smoker  . Smokeless tobacco: Never Used  Substance Use Topics  . Alcohol use: No  . Drug use: No    Comment: Quit 04/11/2012 herion two years ago     Allergies   Morphine and related   Review of Systems Review of Systems  Constitutional: Negative for chills and fever.  Eyes: Negative for visual disturbance.  Respiratory: Negative for cough and shortness of breath.   Cardiovascular: Negative for chest pain.  Gastrointestinal: Positive for abdominal pain, nausea and vomiting. Negative for blood in stool, constipation and diarrhea.  Genitourinary: Negative for dysuria, flank pain, genital  sores, pelvic pain, vaginal bleeding and vaginal discharge.  Musculoskeletal: Positive for back pain. Negative for myalgias and neck stiffness.  Skin: Negative for rash and wound.  Neurological: Negative for dizziness, weakness, light-headedness, numbness and headaches.  All other systems reviewed and are negative.    Physical Exam Updated Vital Signs BP 127/74 (BP Location: Right Arm)   Pulse 85   Temp 98.1 F (36.7 C) (Oral)   Resp 16   Ht 5\' 3"  (1.6 m)   Wt 84.4 kg (186 lb)   SpO2 99%   BMI 32.95 kg/m   Physical Exam  Constitutional: She is oriented to person, place, and time. She appears well-developed and well-nourished.  Non-toxic appearance. She does not appear ill. No distress.  HENT:  Head: Normocephalic and atraumatic.  Mouth/Throat: Oropharynx is clear and moist.  Eyes: EOM are normal. Pupils are equal, round, and reactive to light.  Neck: Normal range of motion. Neck supple.  Cardiovascular: Normal rate and regular rhythm.  Pulmonary/Chest: Effort normal and breath sounds normal.  Abdominal: Soft. Bowel sounds are normal. There is tenderness. There is no rebound, no guarding and no CVA tenderness.  Epigastric and left upper quadrant tenderness to palpation.  No rebound or guarding.  Musculoskeletal: Normal range of motion. She exhibits no edema or tenderness.  Neurological: She is alert and oriented to person, place, and time.  Skin: Skin is warm and dry. Capillary refill takes less than 2 seconds. No rash noted. No erythema.  Psychiatric: She has a normal mood and affect. Her behavior is normal.  Nursing note and vitals reviewed.    ED Treatments / Results  Labs (all labs ordered are listed, but only abnormal results are displayed) Labs Reviewed  COMPREHENSIVE METABOLIC PANEL - Abnormal; Notable for the following components:      Result Value   Glucose, Bld 104 (*)    All other components within normal limits  LIPASE, BLOOD  CBC  I-STAT BETA HCG BLOOD,  ED ([facility], [facility], [facility] ONLY)    EKG  EKG Interpretation None       Radiology No results found.  Procedures Procedures (including critical care time)  Medications Ordered in ED Medications  ondansetron (ZOFRAN) injection 4 mg (not administered)  gi cocktail (Maalox,Lidocaine,Donnatal) (30 mLs Oral Given 04/19/17 1041)     Initial Impression / Assessment and Plan / ED Course  I have reviewed the triage vital signs and the nursing notes.  Pertinent labs & imaging results that were available during my care of the patient were reviewed by me and considered in my medical decision making (see chart for details).     Patient's pain is completely resolved.  Tolerating p.o.'s.  Recently had ultrasound with no evidence of gallbladder disease.  Patient also states she had a spicy chicken sandwich yesterday evening.  Suspect gastritis related to her NSAID  use and dietary choices.  Will start her back on Prilosec.  Patient advised to follow-up closely with a gastroenterologist.  Return precautions given. Final Clinical Impressions(s) / ED Diagnoses   Final diagnoses:  Acute gastritis, presence of bleeding unspecified, unspecified gastritis type    ED Discharge Orders        Ordered    omeprazole (PRILOSEC) 20 MG capsule  2 times daily before meals     04/19/17 1249    ondansetron (ZOFRAN) 4 MG tablet  4 times daily PRN     04/19/17 1249       Loren Racer, MD 04/19/17 1250

## 2017-07-11 ENCOUNTER — Other Ambulatory Visit: Payer: Self-pay | Admitting: Obstetrics & Gynecology

## 2017-07-11 DIAGNOSIS — Z1231 Encounter for screening mammogram for malignant neoplasm of breast: Secondary | ICD-10-CM

## 2017-07-14 ENCOUNTER — Ambulatory Visit (HOSPITAL_COMMUNITY)
Admission: EM | Admit: 2017-07-14 | Discharge: 2017-07-14 | Disposition: A | Discharge status: Home / Self Care | Payer: Self-pay | Attending: Family Medicine | Admitting: Family Medicine

## 2017-07-14 ENCOUNTER — Other Ambulatory Visit: Payer: Self-pay

## 2017-07-14 ENCOUNTER — Encounter (HOSPITAL_COMMUNITY): Payer: Self-pay

## 2017-07-14 DIAGNOSIS — R05 Cough: Secondary | ICD-10-CM

## 2017-07-14 DIAGNOSIS — H66001 Acute suppurative otitis media without spontaneous rupture of ear drum, right ear: Secondary | ICD-10-CM

## 2017-07-14 DIAGNOSIS — J029 Acute pharyngitis, unspecified: Secondary | ICD-10-CM

## 2017-07-14 MED ORDER — METHYLPREDNISOLONE SODIUM SUCC 125 MG IJ SOLR
INTRAMUSCULAR | Status: AC
  Filled 2017-07-14: qty 2

## 2017-07-14 MED ORDER — METHYLPREDNISOLONE ACETATE 80 MG/ML IJ SUSP
INTRAMUSCULAR | Status: AC
  Filled 2017-07-14: qty 1

## 2017-07-14 MED ORDER — METHYLPREDNISOLONE SODIUM SUCC 125 MG IJ SOLR
80.0000 mg | Freq: Once | INTRAMUSCULAR | Status: AC
  Administered 2017-07-14: 80 mg via INTRAMUSCULAR

## 2017-07-14 MED ORDER — AMOXICILLIN 875 MG PO TABS: 875.0000 mg | ORAL_TABLET | Freq: Two times a day (BID) | ORAL | 0 refills | Status: DC

## 2017-07-14 NOTE — ED Provider Notes (Signed)
  [facility]    CSN: [id number] Arrival date & time: 07/14/17  1313  Chief Complaint  Patient presents with  . Sore Throat    Heidi Pearson here for URI complaints.  Duration: 6 days  Associated symptoms: sinus congestion, rhinorrhea, itchy watery eyes, ear pain, sore throat and cough Denies: sinus pain, ear drainage, wheezing, shortness of breath, myalgia and fevers Treatment to date: Ibuprofen, Tylenol, Claritin, INCS Sick contacts: No  Ear is bothering the worse, Right.   ROS:  Const: Denies fevers HEENT: As noted in HPI Lungs: No SOB  Past Medical History:  Diagnosis Date  . ADD (attention deficit disorder)   . Anxiety   . Anxiety   . Chronic knee pain   . Depression   . PTSD (post-traumatic stress disorder)    Family History  Problem Relation Age of Onset  . Hypertension Mother   . Cancer Mother   . Hypertension Father     BP 131/73 (BP Location: Left Arm)   Pulse 76   Temp 99 F (37.2 C) (Oral)   Resp 18   SpO2 99%  General: Awake, alert, appears stated age HEENT: AT, [place], ears patent b/l and TM neg on L, bulging and erythematous on R- purulent material behind ear, nares patent w/o discharge, pharynx pink and without exudates, MMM Neck: No masses or asymmetry Heart: RRR Lungs: CTAB, no accessory muscle use Psych: Age appropriate judgment and insight, normal mood and affect  Acute suppurative otitis media of right ear without spontaneous rupture of tympanic membrane, recurrence not specified  Amoxicillin 875 mg bid for 7 d. Cont INCS and go back on Claritin for allergies.  Solumedrol injection today for both pain and allergy s/s's.  F/u prn.  Pt voiced understanding and agreement to the plan.    Sharlene DoryWendling, Nicholas Paul, [place] 07/14/17 1442

## 2017-07-14 NOTE — ED Triage Notes (Signed)
Pt presents today with sore throat that has been going on since last Sunday. States that within the last 2 days her ears have been starting to hurt really bad. She does take an OTC allergy medication daily. Has not had any sleep because of her sore throat and ear pain.

## 2017-07-16 ENCOUNTER — Ambulatory Visit (HOSPITAL_COMMUNITY)
Admission: EM | Admit: 2017-07-16 | Discharge: 2017-07-16 | Disposition: A | Discharge status: Home / Self Care | Payer: Self-pay | Attending: Emergency Medicine | Admitting: Emergency Medicine

## 2017-07-16 ENCOUNTER — Encounter (HOSPITAL_COMMUNITY): Payer: Self-pay | Admitting: Emergency Medicine

## 2017-07-16 DIAGNOSIS — H66001 Acute suppurative otitis media without spontaneous rupture of ear drum, right ear: Secondary | ICD-10-CM

## 2017-07-16 MED ORDER — CEFTRIAXONE SODIUM 1 G IJ SOLR
1.0000 g | Freq: Once | INTRAMUSCULAR | Status: AC
  Administered 2017-07-16: 1 g via INTRAMUSCULAR

## 2017-07-16 MED ORDER — LIDOCAINE HCL (PF) 1 % IJ SOLN
INTRAMUSCULAR | Status: AC
  Filled 2017-07-16: qty 30

## 2017-07-16 MED ORDER — AMOXICILLIN-POT CLAVULANATE 875-125 MG PO TABS: 1.0000 | ORAL_TABLET | Freq: Two times a day (BID) | ORAL | 0 refills | Status: AC

## 2017-07-16 MED ORDER — CEFTRIAXONE SODIUM 1 G IJ SOLR
INTRAMUSCULAR | Status: AC
  Filled 2017-07-16: qty 10

## 2017-07-16 NOTE — ED Triage Notes (Signed)
Pt here for right ear pain increasing last night; pt sts taking antibiotics

## 2017-07-16 NOTE — ED Provider Notes (Signed)
[facility]    CSN: [id number] Arrival date & time: 07/16/17  1048     History   Chief Complaint Chief Complaint  Patient presents with  . Otalgia    HPI Heidi Pearson is a 42 y.o. female.   Heidi Pearson presents with complaints of right ear pain. This started 3/1. She was seen in [facility] on 3/2 and started on amoxicillin for OM and provided with IM steroids prior to departure. She states pain had improved until yesterday afternoon and last night it worsened. She has taken 3 doses of antibiotics. Denies cough or congestion. States as a child she did get frequent ear infections. No known fevers. She does feel she has worse hearing from right ear. Left ear without pain. Pain is better if sitting up. Currently rates pain 4/10. Has been taking daily claritin and daily flonase. History of anxiety, depression.   ROS per HPI.       Past Medical History:  Diagnosis Date  . ADD (attention deficit disorder)   . Anxiety   . Anxiety   . Chronic knee pain   . Depression   . PTSD (post-traumatic stress disorder)     Patient Active Problem List   Diagnosis Date Noted  . Alcohol dependence (HCC) 12/12/2013  . ADHD (attention deficit hyperactivity disorder) 10/09/2012  . Panic attacks 10/08/2012  . PTSD (post-traumatic stress disorder) 10/08/2012  . MDD (major depressive disorder) 10/08/2012  . Chronic knee pain   . Anxiety     Past Surgical History:  Procedure Laterality Date  . CESAREAN SECTION      OB History    No data available       Home Medications    Prior to Admission medications   Medication Sig Start Date End Date Taking? Authorizing Provider  acetaminophen (TYLENOL) 500 MG tablet Take 500 mg by mouth every 6 (six) hours as needed for mild pain, moderate pain, fever or headache.    [provider]  amoxicillin-clavulanate (AUGMENTIN) 875-125 MG tablet Take 1 tablet by mouth every 12 (twelve) hours for 6 days. 07/16/17 07/22/17  Georgetta Haber,  NP  Cholecalciferol (VITAMIN D PO) Take 1 tablet by mouth daily.    [provider]  loratadine (CLARITIN) 10 MG tablet Take 10 mg by mouth daily.    [provider]  medroxyPROGESTERone (DEPO-PROVERA) 150 MG/ML injection Inject 150 mg into the muscle every 3 (three) months.    [provider]  Multiple Vitamins-Minerals (ZINC PO) Take 1 tablet by mouth daily.    [provider]  omeprazole (PRILOSEC) 20 MG capsule Take 1 capsule (20 mg total) by mouth 2 (two) times daily before a meal. 04/19/17   Loren Racer, MD    Family History Family History  Problem Relation Age of Onset  . Hypertension Mother   . Cancer Mother   . Hypertension Father     Social History Social History   Tobacco Use  . Smoking status: Never Smoker  . Smokeless tobacco: Never Used  Substance Use Topics  . Alcohol use: No  . Drug use: No    Comment: Quit 04/11/2012 herion two years ago     Allergies   Morphine and related   Review of Systems Review of Systems   Physical Exam Triage Vital Signs ED Triage Vitals  Enc Vitals Group     BP 07/16/17 1150 118/65     Pulse Rate 07/16/17 1150 61     Resp 07/16/17 1150 18  Temp 07/16/17 1150 98.5 F (36.9 C)     Temp Source 07/16/17 1150 Oral     SpO2 07/16/17 1150 100 %     Weight --      Height --      Head Circumference --      Peak Flow --      Pain Score 07/16/17 1151 4     Pain Loc --      Pain Edu? --      Excl. in GC? --    No data found.  Updated Vital Signs BP 118/65 (BP Location: Left Arm)   Pulse 61   Temp 98.5 F (36.9 C) (Oral)   Resp 18   SpO2 100%   Visual Acuity Right Eye Distance:   Left Eye Distance:   Bilateral Distance:    Right Eye Near:   Left Eye Near:    Bilateral Near:     Physical Exam  Constitutional: She is oriented to person, place, and time. She appears well-developed and well-nourished. No distress.  HENT:  Head: Normocephalic and atraumatic.  Right Ear:  External ear and ear canal normal. Tympanic membrane is erythematous.  Left Ear: Tympanic membrane, external ear and ear canal normal.  Nose: Nose normal.  Mouth/Throat: Uvula is midline, oropharynx is clear and moist and mucous membranes are normal. No tonsillar exudate.  Eyes: Conjunctivae and EOM are normal. Pupils are equal, round, and reactive to light.  Cardiovascular: Normal rate, regular rhythm and normal heart sounds.  Pulmonary/Chest: Effort normal and breath sounds normal.  Neurological: She is alert and oriented to person, place, and time.  Skin: Skin is warm and dry.     UC Treatments / Results  Labs (all labs ordered are listed, but only abnormal results are displayed) Labs Reviewed - No data to display  EKG  EKG Interpretation None       Radiology No results found.  Procedures Procedures (including critical care time)  Medications Ordered in UC Medications  cefTRIAXone (ROCEPHIN) injection 1 g (not administered)     Initial Impression / Assessment and Plan / UC Course  I have reviewed the triage vital signs and the nursing notes.  Pertinent labs & imaging results that were available during my care of the patient were reviewed by me and considered in my medical decision making (see chart for details).     Right ear with mild redness. Only 3 doses of antibiotics. Patient requesting shot of antibiotics, rocephin provided in clinic today. Will switch to augmentin as well at this time. To continue with claritin and flonase daily. If symptoms worsen or do not improve in the next week to return to be seen or to follow up with PCP. Patient verbalized understanding and agreeable to plan.     Final Clinical Impressions(s) / UC Diagnoses   Final diagnoses:  Acute suppurative otitis media of right ear without spontaneous rupture of tympanic membrane, recurrence not specified    ED Discharge Orders        Ordered    amoxicillin-clavulanate (AUGMENTIN) 875-125  MG tablet  Every 12 hours     07/16/17 1250       Controlled Substance Prescriptions [place] Controlled Substance Registry consulted? Not Applicable   Georgetta HaberBurky, Jasiah Buntin B, NP 07/16/17 1256

## 2017-07-16 NOTE — Discharge Instructions (Signed)
Push fluids to ensure adequate hydration and keep secretions thin.  Tylenol and/or ibuprofen as needed for pain or fevers.  May continue with daily flonase and daily claritin. Stop previous amoxicillin and start daily augmentin for the next 6 days. If symptoms worsen or do not improve in the next week to return to be seen or to follow up with your PCP.

## 2017-10-20 IMAGING — US US ABDOMEN LIMITED
1 series · 14 of 25 positions shown · non-contrast
Comparison: Abdominal ultrasound dated August 02, 2009.

CLINICAL DATA: Epigastric pain.

EXAM:
ULTRASOUND ABDOMEN LIMITED RIGHT UPPER QUADRANT

[Series 1: us abdomen limited · 0.23mm/px · 14 of 37 slices shown]
[im 1/37]
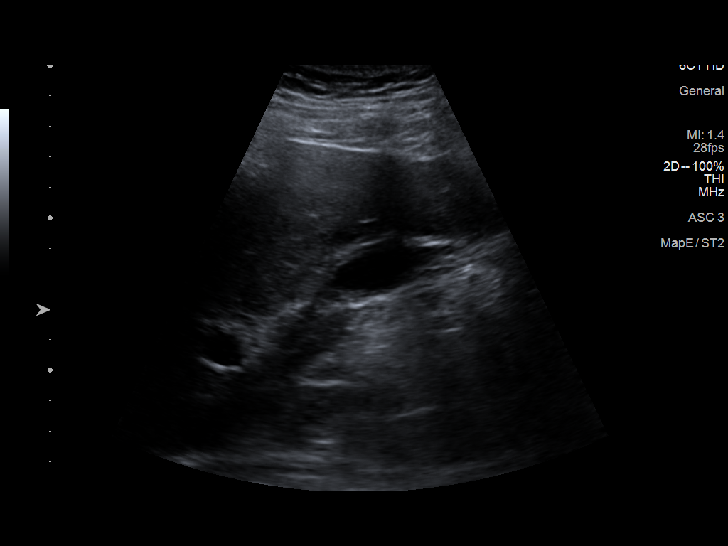
[im 4/37]
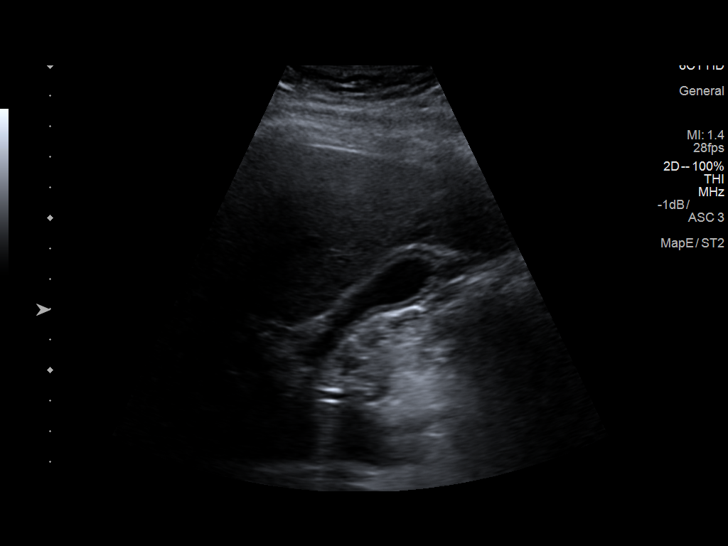
[im 7/37]
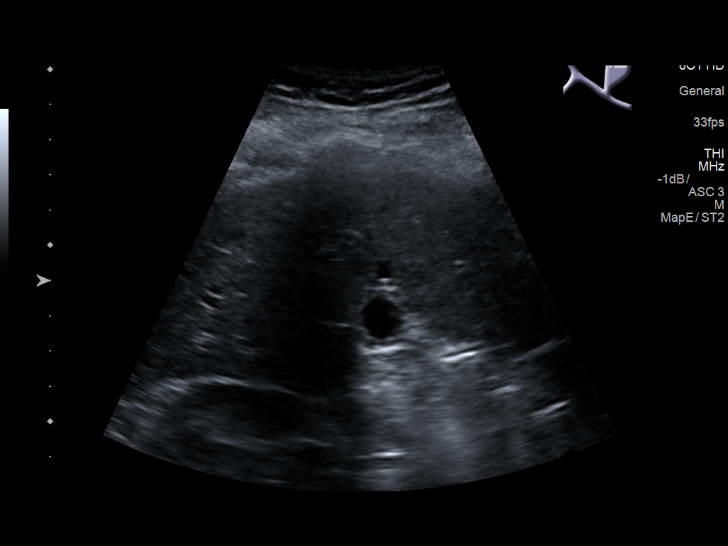
[im 10/37]
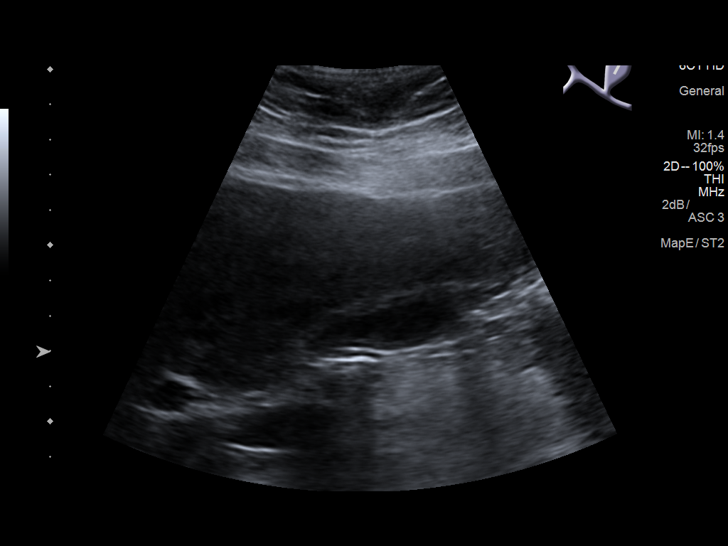
[im 13/37]
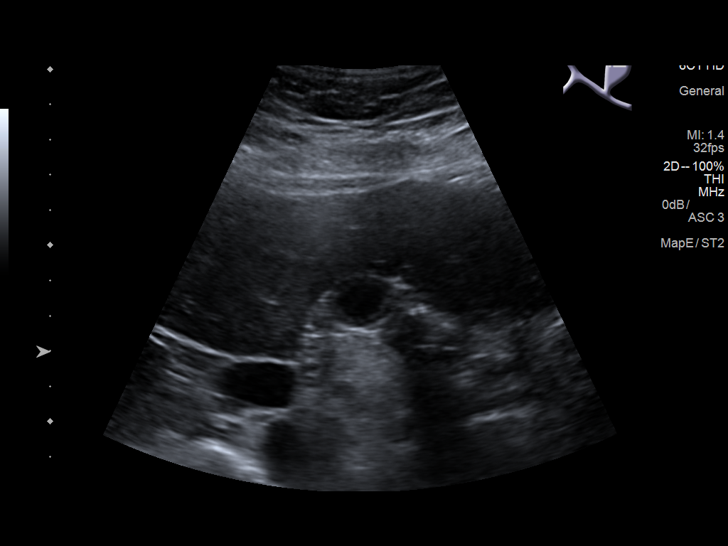
[im 14/37]
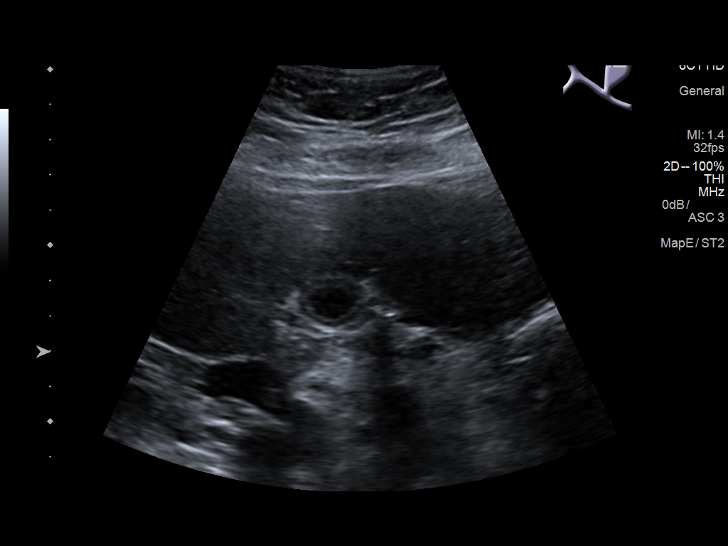
[im 17/37]
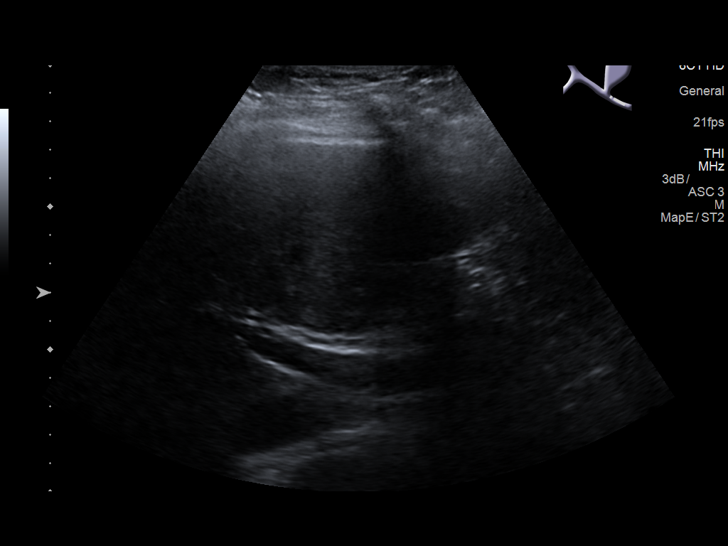
[im 20/37]
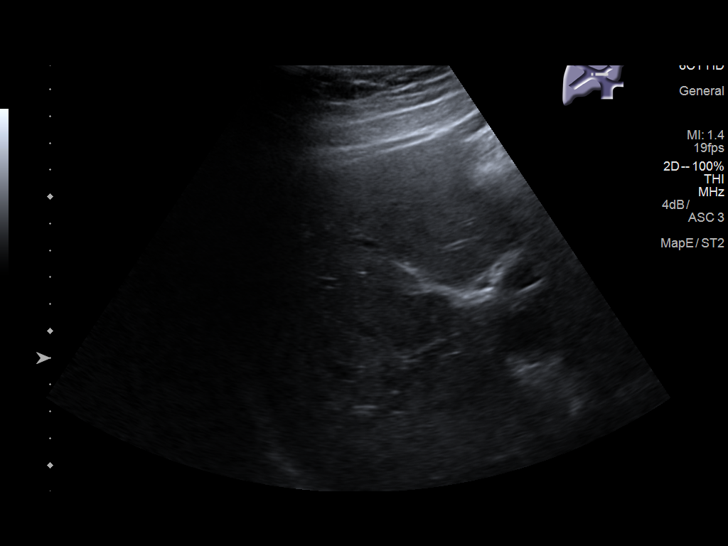
[im 23/37]
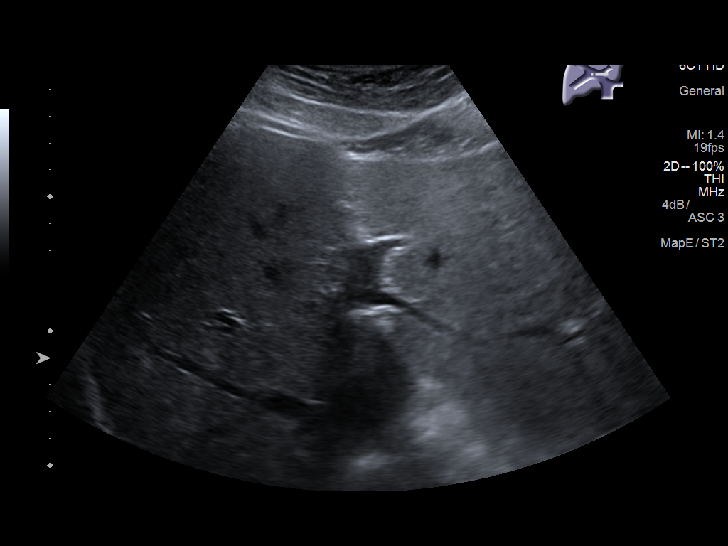
[im 25/37]
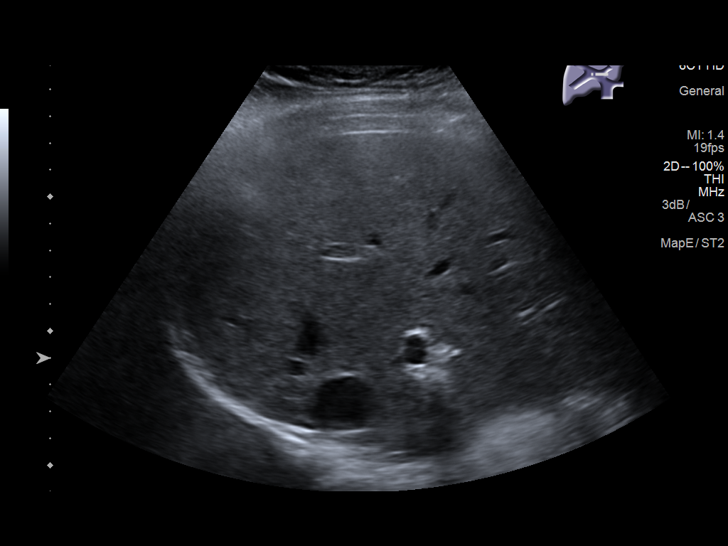
[im 28/37]
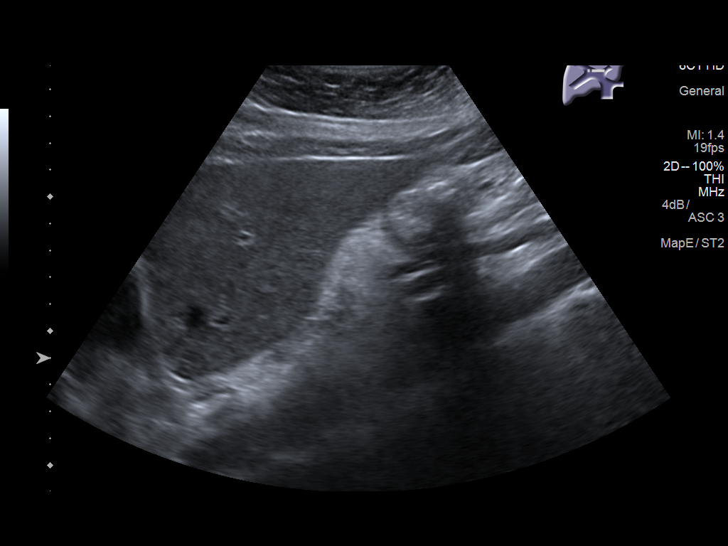
[im 31/37]
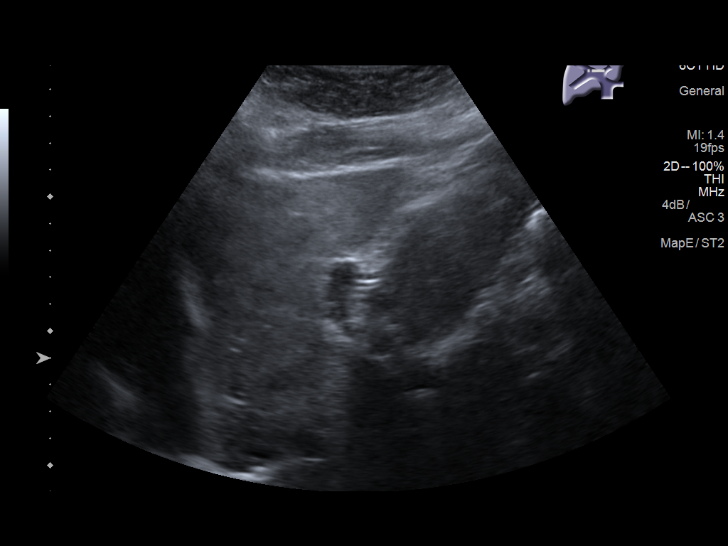
[im 34/37]
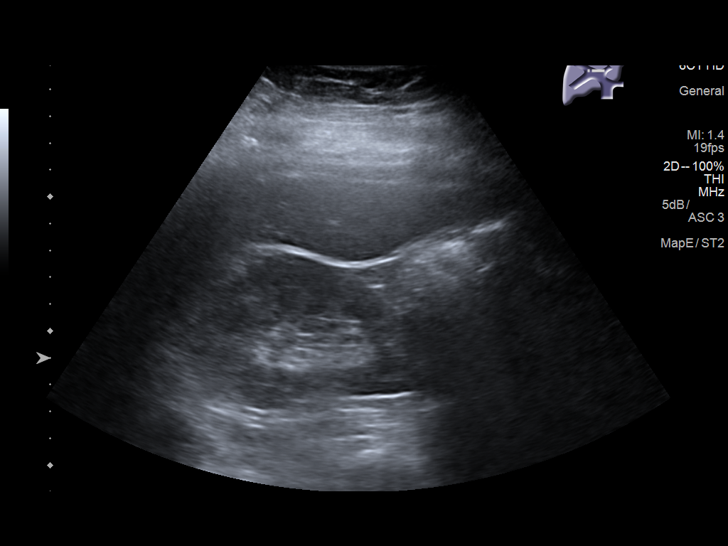
[im 37/37]
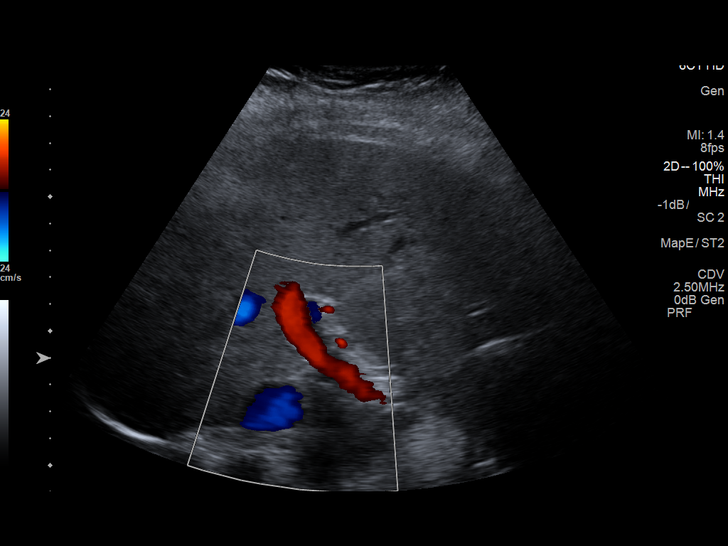

[14 of 25 positions shown; findings below may reference images not displayed]

FINDINGS: Gallbladder:

Contracted. No gallstones or wall thickening visualized. No
sonographic Murphy sign noted by sonographer.

Common bile duct:

Diameter: 3 mm, normal.

Liver:

No focal lesion identified. Within normal limits in parenchymal
echogenicity. Portal vein is patent on color Doppler imaging with
normal direction of blood flow towards the liver.
IMPRESSION: Contracted gallbladder. Otherwise normal right upper quadrant
ultrasound.

## 2018-05-20 ENCOUNTER — Other Ambulatory Visit: Payer: Self-pay

## 2018-05-20 ENCOUNTER — Ambulatory Visit (INDEPENDENT_AMBULATORY_CARE_PROVIDER_SITE_OTHER): Payer: Self-pay

## 2018-05-20 ENCOUNTER — Ambulatory Visit (HOSPITAL_COMMUNITY)
Admission: EM | Admit: 2018-05-20 | Discharge: 2018-05-20 | Disposition: A | Discharge status: Home / Self Care | Payer: Self-pay

## 2018-05-20 ENCOUNTER — Encounter (HOSPITAL_COMMUNITY): Payer: Self-pay | Admitting: Emergency Medicine

## 2018-05-20 DIAGNOSIS — M25562 Pain in left knee: Secondary | ICD-10-CM

## 2018-05-20 DIAGNOSIS — S83402A Sprain of unspecified collateral ligament of left knee, initial encounter: Secondary | ICD-10-CM | POA: Insufficient documentation

## 2018-05-20 NOTE — Discharge Instructions (Addendum)
I believe you have sprained 1 of the ligaments in your knee Your x-ray was negative for any acute abnormalities We will place you in a knee brace We will have you follow-up with orthopedics in the next couple weeks if you are still having symptoms Rest, ice, elevate the knee You can do ibuprofen 600 mg every 6 hours for pain inflammation

## 2018-05-20 NOTE — ED Triage Notes (Signed)
Tripped going up steps last night.  Thinks she kicked the step and jarred her body.  Patient did not fall to the ground.  Patient reports jarring body.  Pain is in left knee.  Slight pain with ambulation, but pain is worse with bending the left knee

## 2018-05-20 NOTE — ED Provider Notes (Addendum)
[facility]    CSN: [id number] Arrival date & time: 05/20/18  [id number]     History   Chief Complaint Chief Complaint  Patient presents with  . Knee Pain    HPI Rica KoyanagiKourtney A Pearson is a 43 y.o. female.   Patient is a 43 year old female that presents with left knee pain.  This started last night after she tripped going up the steps.  Reports she did not fall on the knee but she did jar the knee and twisted the knee causing pain.  She is having mild pain with ambulation and flexion of the knee.  She had trouble sleeping last night due to the pain and certain movements.  She did take ibuprofen for pain with minimal relief.  There is no obvious swelling.  No other injuries.  Denies any numbness, tingling or loss of sensation.  ROS per HPI      Past Medical History:  Diagnosis Date  . ADD (attention deficit disorder)   . Anxiety   . Anxiety   . Chronic knee pain   . Depression   . PTSD (post-traumatic stress disorder)     Patient Active Problem List   Diagnosis Date Noted  . Alcohol dependence (HCC) 12/12/2013  . ADHD (attention deficit hyperactivity disorder) 10/09/2012  . Panic attacks 10/08/2012  . PTSD (post-traumatic stress disorder) 10/08/2012  . MDD (major depressive disorder) 10/08/2012  . Chronic knee pain   . Anxiety     Past Surgical History:  Procedure Laterality Date  . CESAREAN SECTION      OB History   No obstetric history on file.      Home Medications    Prior to Admission medications   Medication Sig Start Date End Date Taking? Authorizing Provider  acetaminophen (TYLENOL) 500 MG tablet Take 500 mg by mouth every 6 (six) hours as needed for mild pain, moderate pain, fever or headache.   Yes [provider]  Biotin 1000 MCG tablet Take 1,000 mcg by mouth 3 (three) times daily.   Yes [provider]  Cholecalciferol (VITAMIN D PO) Take 1 tablet by mouth daily.   Yes [provider]  ibuprofen (ADVIL,MOTRIN) 200  MG tablet Take 200 mg by mouth every 6 (six) hours as needed.   Yes [provider]  loratadine (CLARITIN) 10 MG tablet Take 10 mg by mouth daily.   Yes [provider]  Multiple Vitamins-Minerals (ZINC PO) Take 1 tablet by mouth daily.   Yes [provider]  medroxyPROGESTERone (DEPO-PROVERA) 150 MG/ML injection Inject 150 mg into the muscle every 3 (three) months.    [provider]  omeprazole (PRILOSEC) 20 MG capsule Take 1 capsule (20 mg total) by mouth 2 (two) times daily before a meal. 04/19/17   Loren RacerYelverton, David, MD    Family History Family History  Problem Relation Age of Onset  . Hypertension Mother   . Cancer Mother   . Hypertension Father     Social History Social History   Tobacco Use  . Smoking status: Never Smoker  . Smokeless tobacco: Never Used  Substance Use Topics  . Alcohol use: No  . Drug use: No    Types: Oxycodone, Hydrocodone    Comment: Quit 04/11/2012 herion two years ago     Allergies   Morphine and related   Review of Systems Review of Systems   Physical Exam Triage Vital Signs ED Triage Vitals  Enc Vitals Group     BP 05/20/18 0849 Marland Kitchen(!)  188/68     Pulse Rate 05/20/18 0849 72     Resp 05/20/18 0849 18     Temp 05/20/18 0849 98.4 F (36.9 C)     Temp Source 05/20/18 0849 Oral     SpO2 05/20/18 0849 100 %     Weight --      Height --      Head Circumference --      Peak Flow --      Pain Score 05/20/18 0855 4     Pain Loc --      Pain Edu? --      Excl. in GC? --    No data found.  Updated Vital Signs BP 118/68 (BP Location: Left Arm)   Pulse 72   Temp 98.4 F (36.9 C) (Oral)   Resp 18   SpO2 100%   Visual Acuity Right Eye Distance:   Left Eye Distance:   Bilateral Distance:    Right Eye Near:   Left Eye Near:    Bilateral Near:     Physical Exam Vitals signs and nursing note reviewed.  Constitutional:      Appearance: Normal appearance. She is not ill-appearing or  toxic-appearing.  HENT:     Head: Normocephalic and atraumatic.     Nose: Nose normal.  Eyes:     Conjunctiva/sclera: Conjunctivae normal.  Neck:     Musculoskeletal: Normal range of motion.  Pulmonary:     Effort: Pulmonary effort is normal.  Musculoskeletal:        General: Tenderness present. No swelling or deformity.     Right lower leg: No edema.     Left lower leg: No edema.     Comments: Mostly tender to varus and valgus movements of the left knee. No obvious laxity  Mildly tender to lateral and medial collateral ligament areas No bruising, swelling or erythema. No deformities.  Skin:    General: Skin is warm and dry.  Neurological:     Mental Status: She is alert.  Psychiatric:        Mood and Affect: Mood normal.      UC Treatments / Results  Labs (all labs ordered are listed, but only abnormal results are displayed) Labs Reviewed - No data to display  EKG None  Radiology Dg Knee Complete 4 Views Left  Result Date: 05/20/2018 CLINICAL DATA:  Knee pain, fall EXAM: LEFT KNEE - COMPLETE 4+ VIEW COMPARISON:  06/07/2015 FINDINGS: No evidence of fracture, dislocation, or joint effusion. No evidence of arthropathy or other focal bone abnormality. Soft tissues are unremarkable. IMPRESSION: Negative. Electronically Signed   By: Charlett Nose M.D.   On: 05/20/2018 09:31    Procedures Procedures (including critical care time)  Medications Ordered in UC Medications - No data to display  Initial Impression / Assessment and Plan / UC Course  I have reviewed the triage vital signs and the nursing notes.  Pertinent labs & imaging results that were available during my care of the patient were reviewed by me and considered in my medical decision making (see chart for details).     Ligamentous sprain X-ray negative for any acute abnormality Knee sleeve given in clinic Rest, ice, elevate Ibuprofen 60 mg every 6 hours as needed for pain and inflammation Follow-up with  orthopedics for continued or worsening symptoms Final Clinical Impressions(s) / UC Diagnoses   Final diagnoses:  Sprain of collateral ligament of left knee, initial encounter     Discharge Instructions  I believe you have sprained 1 of the ligaments in your knee Your x-ray was negative for any acute abnormalities We will place you in a knee brace We will have you follow-up with orthopedics in the next couple weeks if you are still having symptoms Rest, ice, elevate the knee You can do ibuprofen 600 mg every 6 hours for pain inflammation    ED Prescriptions    None     Controlled Substance Prescriptions Heidi Pearson consulted? Not Applicable   Janace Aris, NP 05/20/18 2130    Janace Aris, NP 05/20/18 1004

## 2018-07-12 ENCOUNTER — Other Ambulatory Visit: Payer: Self-pay

## 2018-07-12 ENCOUNTER — Encounter (HOSPITAL_COMMUNITY): Payer: Self-pay | Admitting: Emergency Medicine

## 2018-07-12 ENCOUNTER — Ambulatory Visit (HOSPITAL_COMMUNITY)
Admission: EM | Admit: 2018-07-12 | Discharge: 2018-07-12 | Disposition: A | Discharge status: Home / Self Care | Payer: 59 | Attending: Emergency Medicine | Admitting: Emergency Medicine

## 2018-07-12 DIAGNOSIS — N390 Urinary tract infection, site not specified: Secondary | ICD-10-CM

## 2018-07-12 DIAGNOSIS — R310 Gross hematuria: Secondary | ICD-10-CM

## 2018-07-12 DIAGNOSIS — N10 Acute tubulo-interstitial nephritis: Secondary | ICD-10-CM | POA: Diagnosis not present

## 2018-07-12 LAB — COMPREHENSIVE METABOLIC PANEL
ALT: 75 U/L — ABNORMAL HIGH (ref 0–44)
AST: 37 U/L (ref 15–41)
Albumin: 3.8 g/dL (ref 3.5–5.0)
Alkaline Phosphatase: 132 U/L — ABNORMAL HIGH (ref 38–126)
Anion gap: 7 (ref 5–15)
BUN: 8 mg/dL (ref 6–20)
CO2: 26 mmol/L (ref 22–32)
Calcium: 9.4 mg/dL (ref 8.9–10.3)
Chloride: 104 mmol/L (ref 98–111)
Creatinine, Ser: 0.74 mg/dL (ref 0.44–1.00)
GFR calc non Af Amer: >60 mL/min (ref >60)
Glucose, Bld: 101 mg/dL — ABNORMAL HIGH (ref 70–99)
Potassium: 3.8 mmol/L (ref 3.5–5.1)
Sodium: 137 mmol/L (ref 135–145)
Total Bilirubin: 0.4 mg/dL (ref 0.3–1.2)
Total Protein: 7.3 g/dL (ref 6.5–8.1)

## 2018-07-12 LAB — CBC
HCT: 37.6 % (ref 36.0–46.0)
Hemoglobin: 12.1 g/dL (ref 12.0–15.0)
MCH: 27.9 pg (ref 26.0–34.0)
MCHC: 32.2 g/dL (ref 30.0–36.0)
MCV: 86.8 fL (ref 80.0–100.0)
Platelets: 304 10*3/uL (ref 150–400)
RBC: 4.33 MIL/uL (ref 3.87–5.11)
RDW: 12 % (ref 11.5–15.5)
WBC: 9.1 10*3/uL (ref 4.0–10.5)
nRBC: 0 % (ref 0.0–0.2)

## 2018-07-12 LAB — POCT URINALYSIS DIP (DEVICE)
Bilirubin Urine: NEGATIVE
GLUCOSE, UA: NEGATIVE mg/dL
Ketones, ur: NEGATIVE mg/dL
Nitrite: NEGATIVE
Protein, ur: >=300 mg/dL — AB
SPECIFIC GRAVITY, URINE: 1.02 (ref 1.005–1.030)
UROBILINOGEN UA: 0.2 mg/dL (ref 0.0–1.0)
pH: 7.5 (ref 5.0–8.0)

## 2018-07-12 MED ORDER — KETOROLAC TROMETHAMINE 60 MG/2ML IM SOLN
INTRAMUSCULAR | Status: AC
  Filled 2018-07-12: qty 2

## 2018-07-12 MED ORDER — ONDANSETRON 4 MG PO TBDP
ORAL_TABLET | ORAL | Status: AC
  Filled 2018-07-12: qty 1

## 2018-07-12 MED ORDER — KETOROLAC TROMETHAMINE 60 MG/2ML IM SOLN
60.0000 mg | Freq: Once | INTRAMUSCULAR | Status: AC
  Administered 2018-07-12: 60 mg via INTRAMUSCULAR

## 2018-07-12 MED ORDER — SULFAMETHOXAZOLE-TRIMETHOPRIM 800-160 MG PO TABS: 1.0000 | ORAL_TABLET | Freq: Two times a day (BID) | ORAL | 0 refills | Status: AC

## 2018-07-12 MED ORDER — ONDANSETRON 4 MG PO TBDP
4.0000 mg | ORAL_TABLET | Freq: Once | ORAL | Status: AC
  Administered 2018-07-12: 4 mg via ORAL

## 2018-07-12 MED ORDER — CEFTRIAXONE SODIUM 1 G IJ SOLR
1.0000 g | Freq: Once | INTRAMUSCULAR | Status: AC
  Administered 2018-07-12: 1 g via INTRAMUSCULAR

## 2018-07-12 MED ORDER — TRAMADOL HCL 50 MG PO TABS: 50.0000 mg | ORAL_TABLET | Freq: Four times a day (QID) | ORAL | 0 refills | Status: DC | PRN

## 2018-07-12 MED ORDER — LIDOCAINE HCL (PF) 2 % IJ SOLN
INTRAMUSCULAR | Status: AC
  Filled 2018-07-12: qty 2

## 2018-07-12 MED ORDER — CEFTRIAXONE SODIUM 1 G IJ SOLR
INTRAMUSCULAR | Status: AC
  Filled 2018-07-12: qty 10

## 2018-07-12 MED ORDER — ONDANSETRON 4 MG PO TBDP: 4.0000 mg | ORAL_TABLET | Freq: Three times a day (TID) | ORAL | 0 refills | Status: DC | PRN

## 2018-07-12 NOTE — ED Provider Notes (Signed)
[facility]    CSN: [id number] Arrival date & time: 07/12/18  1906     History   Chief Complaint Chief Complaint  Patient presents with  . Hematuria    HPI Heidi Pearson is a 43 y.o. female.   Heidi Pearson presents with complaints of urinary frequency, pain with urination, pelvic pressure and blood in urine, which started today. States has feel not well for a week with some nausea, but today worsened. States has had kidney infection in the past but this is different. Pain was waxing and waning but now is constant. Some nausea, no vomiting. Patient did compare pain to labor pain. No vaginal symptoms. No fevers. States has passed blood clots. Hx of add, anxiety, depression.    ROS per HPI.      Past Medical History:  Diagnosis Date  . ADD (attention deficit disorder)   . Anxiety   . Anxiety   . Chronic knee pain   . Depression   . PTSD (post-traumatic stress disorder)     Patient Active Problem List   Diagnosis Date Noted  . Alcohol dependence (HCC) 12/12/2013  . ADHD (attention deficit hyperactivity disorder) 10/09/2012  . Panic attacks 10/08/2012  . PTSD (post-traumatic stress disorder) 10/08/2012  . MDD (major depressive disorder) 10/08/2012  . Chronic knee pain   . Anxiety     Past Surgical History:  Procedure Laterality Date  . CESAREAN SECTION      OB History   No obstetric history on file.      Home Medications    Prior to Admission medications   Medication Sig Start Date End Date Taking? Authorizing Provider  acetaminophen (TYLENOL) 500 MG tablet Take 500 mg by mouth every 6 (six) hours as needed for mild pain, moderate pain, fever or headache.    [provider]  Biotin 1000 MCG tablet Take 1,000 mcg by mouth 3 (three) times daily.    [provider]  Cholecalciferol (VITAMIN D PO) Take 1 tablet by mouth daily.    [provider]  ibuprofen (ADVIL,MOTRIN) 200 MG tablet Take 200 mg by mouth every 6 (six)  hours as needed.    [provider]  loratadine (CLARITIN) 10 MG tablet Take 10 mg by mouth daily.    [provider]  medroxyPROGESTERone (DEPO-PROVERA) 150 MG/ML injection Inject 150 mg into the muscle every 3 (three) months.    [provider]  Multiple Vitamins-Minerals (ZINC PO) Take 1 tablet by mouth daily.    [provider]  omeprazole (PRILOSEC) 20 MG capsule Take 1 capsule (20 mg total) by mouth 2 (two) times daily before a meal. 04/19/17   Loren Racer, MD  ondansetron (ZOFRAN-ODT) 4 MG disintegrating tablet Take 1 tablet (4 mg total) by mouth every 8 (eight) hours as needed for nausea or vomiting. 07/12/18   Georgetta Haber, NP  sulfamethoxazole-trimethoprim (BACTRIM DS) 800-160 MG tablet Take 1 tablet by mouth 2 (two) times daily for 14 days. 07/12/18 07/26/18  Georgetta Haber, NP  traMADol (ULTRAM) 50 MG tablet Take 1 tablet (50 mg total) by mouth every 6 (six) hours as needed. 07/12/18   Georgetta Haber, NP    Family History Family History  Problem Relation Age of Onset  . Hypertension Mother   . Cancer Mother   . Hypertension Father     Social History Social History   Tobacco Use  . Smoking status: Never Smoker  . Smokeless tobacco: Never Used  Substance Use Topics  .  Alcohol use: No  . Drug use: No    Types: Oxycodone, Hydrocodone    Comment: Quit 04/11/2012 herion two years ago     Allergies   Morphine and related   Review of Systems Review of Systems   Physical Exam Triage Vital Signs ED Triage Vitals [07/12/18 1951]  Enc Vitals Group     BP (!) 146/87     Pulse Rate 89     Resp      Temp 98.5 F (36.9 C)     Temp Source Temporal     SpO2 100 %     Weight 180 lb (81.6 kg)     Height  (1.6 m)     Head Circumference      Peak Flow      Pain Score 10     Pain Loc      Pain Edu?      Excl. in GC?    No data found.  Updated Vital Signs BP (!) 146/87 (BP Location: Right Arm)   Pulse 89   Temp 98.5  F (36.9 C) (Temporal)   Ht  (1.6 m)   Wt 180 lb (81.6 kg)   SpO2 100%   BMI 31.89 kg/m    Physical Exam Constitutional:      General: She is not in acute distress.    Appearance: She is well-developed.     Comments: Patient tearful in pain   Cardiovascular:     Rate and Rhythm: Normal rate and regular rhythm.     Heart sounds: Normal heart sounds.  Pulmonary:     Effort: Pulmonary effort is normal.     Breath sounds: Normal breath sounds.  Abdominal:     General: Abdomen is flat.     Tenderness: There is abdominal tenderness in the suprapubic area. There is no right CVA tenderness or left CVA tenderness.     Comments: Very mild generalized pelvis pain, palpation does not necessarily worsen pain, L>R  Genitourinary:    Comments: Gross hematuria noted with urine specimen Skin:    General: Skin is warm and dry.  Neurological:     Mental Status: She is alert and oriented to person, place, and time.      UC Treatments / Results  Labs (all labs ordered are listed, but only abnormal results are displayed) Labs Reviewed  POCT URINALYSIS DIP (DEVICE) - Abnormal; Notable for the following components:      Result Value   Hgb urine dipstick LARGE (*)    Protein, ur >=300 (*)    Leukocytes,Ua SMALL (*)    All other components within normal limits  URINE CULTURE  CBC  COMPREHENSIVE METABOLIC PANEL    EKG None  Radiology No results found.  Procedures Procedures (including critical care time)  Medications Ordered in UC Medications  ondansetron (ZOFRAN-ODT) disintegrating tablet 4 mg (4 mg Oral Given 07/12/18 2045)  ketorolac (TORADOL) injection 60 mg (60 mg Intramuscular Given 07/12/18 2045)  cefTRIAXone (ROCEPHIN) injection 1 g (1 g Intramuscular Given 07/12/18 2045)    Initial Impression / Assessment and Plan / UC Course  I have reviewed the triage vital signs and the nursing notes.  Pertinent labs & imaging results that were available during my care of the  patient were reviewed by me and considered in my medical decision making (see chart for details).     Quite significant pain with urinary symptoms. leuks to urine and hematuria. Treat with pyelo as well as with  toradol, concern for kidney stone. Discussed this with patient. CBC wnl at this time, cmp still pending. Will notify patient of any concerning results. Strict er return precautions as may need CT if persistent or worsening of symptoms. Patient verbalized understanding and agreeable to plan.    Final Clinical Impressions(s) / UC Diagnoses   Final diagnoses:  Gross hematuria  Pyelonephritis     Discharge Instructions     I am worried about possible kidney stone.  These often are passed without treatment.  However, if worsening pain or does not improve in the next 48 hours I recommend going to the ER for further evaluation and definitive diagnosis.  If unable to urinate, develop fevers or chills, worsening or persistent pain please go to the ER.  Your urine is also concerning for infection. We have started treatment with shot today and to complete course of antibiotics as well.  Drink plenty of fluids and empty your bladder regularly.  Zofran as needed for nausea. Ibuprofen every 8 hours as needed. Tramadol for breakthrough pain. May cause drowsiness. Please do not take if driving or drinking alcohol.  I see you have skin reaction to morphine, this medication may cause similar.     ED Prescriptions    Medication Sig Dispense Auth. Provider   sulfamethoxazole-trimethoprim (BACTRIM DS) 800-160 MG tablet Take 1 tablet by mouth 2 (two) times daily for 14 days. 28 tablet Burky, Natalie B, NP   ondansetron (ZOFRAN-ODT) 4 MG disintegrating tablet Take 1 tablet (4 mg total) by mouth every 8 (eight) hours as needed for nausea or vomiting. 12 tablet Linus Mako B, NP   traMADol (ULTRAM) 50 MG tablet Take 1 tablet (50 mg total) by mouth every 6 (six) hours as needed. 5 tablet Georgetta Haber, NP     Controlled Substance Prescriptions [place] Controlled Substance Registry consulted? Not Applicable   Georgetta Haber, NP 07/12/18 [phone]

## 2018-07-12 NOTE — ED Triage Notes (Signed)
Per pt she woke up this morning with some uncomfortable feeling when she peed and having some urinary frequency and now having blood in her urine. Lots of pressure in her pelvic area. No fevers or chills.

## 2018-07-12 NOTE — Discharge Instructions (Addendum)
I am worried about possible kidney stone.  These often are passed without treatment.  However, if worsening pain or does not improve in the next 48 hours I recommend going to the ER for further evaluation and definitive diagnosis.  If unable to urinate, develop fevers or chills, worsening or persistent pain please go to the ER.  Your urine is also concerning for infection. We have started treatment with shot today and to complete course of antibiotics as well.  Drink plenty of fluids and empty your bladder regularly.  Zofran as needed for nausea. Ibuprofen every 8 hours as needed. Tramadol for breakthrough pain. May cause drowsiness. Please do not take if driving or drinking alcohol.  I see you have skin reaction to morphine, this medication may cause similar.

## 2018-07-15 ENCOUNTER — Telehealth (HOSPITAL_COMMUNITY): Payer: Self-pay | Admitting: Emergency Medicine

## 2018-07-15 LAB — URINE CULTURE

## 2018-07-15 NOTE — Telephone Encounter (Signed)
Patient contacted and made aware of all results, all questions answered.  Urine treated with bactrim.

## 2019-08-30 ENCOUNTER — Other Ambulatory Visit: Payer: Self-pay

## 2019-08-30 ENCOUNTER — Encounter (HOSPITAL_BASED_OUTPATIENT_CLINIC_OR_DEPARTMENT_OTHER): Payer: Self-pay | Admitting: Emergency Medicine

## 2019-08-30 ENCOUNTER — Emergency Department (HOSPITAL_BASED_OUTPATIENT_CLINIC_OR_DEPARTMENT_OTHER)
Admission: EM | Admit: 2019-08-30 | Discharge: 2019-08-30 | Disposition: A | Discharge status: Home / Self Care | Payer: 59 | Attending: Emergency Medicine | Admitting: Emergency Medicine

## 2019-08-30 DIAGNOSIS — M5489 Other dorsalgia: Secondary | ICD-10-CM | POA: Diagnosis present

## 2019-08-30 DIAGNOSIS — Z79899 Other long term (current) drug therapy: Secondary | ICD-10-CM | POA: Insufficient documentation

## 2019-08-30 DIAGNOSIS — M5440 Lumbago with sciatica, unspecified side: Secondary | ICD-10-CM

## 2019-08-30 LAB — PREGNANCY, URINE: Preg Test, Ur: NEGATIVE

## 2019-08-30 MED ORDER — METHYLPREDNISOLONE 4 MG PO TBPK: ORAL_TABLET | ORAL | 0 refills | Status: DC

## 2019-08-30 NOTE — ED Provider Notes (Signed)
[facility] EMERGENCY DEPARTMENT Provider Note   CSN: [id number] Arrival date & time: 08/30/19  9509     History Chief Complaint  Patient presents with  . Back Pain    Heidi Pearson is a 44 y.o. female.  Patient with intermittent left-sided low back pain shooting down to the left hip and left knee.  Has tried Mobic and muscle relaxant with minimal relief.  Denies any loss of bowel or bladder.  No weakness or numbness to lower extremities.  The history is provided by the patient.  Back Pain Location:  Lumbar spine Radiates to:  L knee Pain severity:  Mild Onset quality:  Gradual Timing:  Intermittent Progression:  Waxing and waning Chronicity:  New Relieved by:  NSAIDs Worsened by:  Movement Associated symptoms: no abdominal pain, no chest pain, no dysuria and no fever        Past Medical History:  Diagnosis Date  . ADD (attention deficit disorder)   . Anxiety   . Anxiety   . Chronic knee pain   . Depression   . PTSD (post-traumatic stress disorder)     Patient Active Problem List   Diagnosis Date Noted  . Alcohol dependence (HCC) 12/12/2013  . ADHD (attention deficit hyperactivity disorder) 10/09/2012  . Panic attacks 10/08/2012  . PTSD (post-traumatic stress disorder) 10/08/2012  . MDD (major depressive disorder) 10/08/2012  . Chronic knee pain   . Anxiety     Past Surgical History:  Procedure Laterality Date  . CESAREAN SECTION       OB History   No obstetric history on file.     Family History  Problem Relation Age of Onset  . Hypertension Mother   . Cancer Mother   . Hypertension Father     Social History   Tobacco Use  . Smoking status: Never Smoker  . Smokeless tobacco: Never Used  Substance Use Topics  . Alcohol use: No  . Drug use: No    Types: Oxycodone, Hydrocodone    Comment: Quit 04/11/2012 herion two years ago    Home Medications Prior to Admission medications   Medication Sig Start Date End Date Taking?  Authorizing Provider  acetaminophen (TYLENOL) 500 MG tablet Take 500 mg by mouth every 6 (six) hours as needed for mild pain, moderate pain, fever or headache.    [provider]  Biotin 1000 MCG tablet Take 1,000 mcg by mouth 3 (three) times daily.    [provider]  Cholecalciferol (VITAMIN D PO) Take 1 tablet by mouth daily.    [provider]  ibuprofen (ADVIL,MOTRIN) 200 MG tablet Take 200 mg by mouth every 6 (six) hours as needed.    [provider]  loratadine (CLARITIN) 10 MG tablet Take 10 mg by mouth daily.    [provider]  medroxyPROGESTERone (DEPO-PROVERA) 150 MG/ML injection Inject 150 mg into the muscle every 3 (three) months.    [provider]  methylPREDNISolone (MEDROL DOSEPAK) 4 MG TBPK tablet Follow package insert 08/30/19   Marialena Wollen, DO  Multiple Vitamins-Minerals (ZINC PO) Take 1 tablet by mouth daily.    [provider]  omeprazole (PRILOSEC) 20 MG capsule Take 1 capsule (20 mg total) by mouth 2 (two) times daily before a meal. 04/19/17   Loren Racer, MD  ondansetron (ZOFRAN-ODT) 4 MG disintegrating tablet Take 1 tablet (4 mg total) by mouth every 8 (eight) hours as needed for nausea or vomiting. 07/12/18   Georgetta Haber, NP  traMADol (ULTRAM) 50 MG tablet Take 1 tablet (50 mg total) by mouth every 6 (six) hours as needed. 07/12/18   Georgetta Haber, NP    Allergies    Morphine and related  Review of Systems   Review of Systems  Constitutional: Negative for chills and fever.  HENT: Negative for ear pain and sore throat.   Eyes: Negative for pain and visual disturbance.  Respiratory: Negative for cough and shortness of breath.   Cardiovascular: Negative for chest pain and palpitations.  Gastrointestinal: Negative for abdominal pain and vomiting.  Genitourinary: Negative for dysuria and hematuria.  Musculoskeletal: Positive for back pain and gait problem. Negative for arthralgias.  Skin:  Negative for color change and rash.  Neurological: Negative for seizures and syncope.  All other systems reviewed and are negative.   Physical Exam Updated Vital Signs BP 123/80 (BP Location: Right Arm)   Pulse 80   Temp 98 F (36.7 C) (Oral)   Resp 18   SpO2 100%   Physical Exam Vitals and nursing note reviewed.  Constitutional:      General: She is not in acute distress.    Appearance: She is well-developed. She is not ill-appearing.  HENT:     Head: Normocephalic and atraumatic.     Nose: Nose normal.     Mouth/Throat:     Mouth: Mucous membranes are moist.  Eyes:     Extraocular Movements: Extraocular movements intact.     Conjunctiva/sclera: Conjunctivae normal.     Pupils: Pupils are equal, round, and reactive to light.  Cardiovascular:     Rate and Rhythm: Normal rate and regular rhythm.     Heart sounds: No murmur.  Pulmonary:     Effort: Pulmonary effort is normal. No respiratory distress.     Breath sounds: Normal breath sounds.  Abdominal:     General: Abdomen is flat.     Palpations: Abdomen is soft.     Tenderness: There is no abdominal tenderness.  Musculoskeletal:        General: Tenderness present. Normal range of motion.     Cervical back: Normal range of motion and neck supple.  Skin:    General: Skin is warm and dry.  Neurological:     General: No focal deficit present.     Mental Status: She is alert.     Cranial Nerves: No cranial nerve deficit.     Sensory: No sensory deficit.     Motor: No weakness.     Coordination: Coordination normal.     Comments: 5+ out of 5 strength throughout, normal sensation, antalgic gait     ED Results / Procedures / Treatments   Labs (all labs ordered are listed, but only abnormal results are displayed) Labs Reviewed  PREGNANCY, URINE    EKG None  Radiology No results found.  Procedures Procedures (including critical care time)  Medications Ordered in ED Medications - No data to display  ED  Course  I have reviewed the triage vital signs and the nursing notes.  Pertinent labs & imaging results that were available during my care of the patient were reviewed by me and considered in my medical decision making (see chart for details).    MDM Rules/Calculators/A&P                      Heidi Pearson is a 44 year old female with history of anxiety who presents to the ED with low back pain.  Patient with  pain on and off for the last several weeks.  Has tried anti-inflammatories and muscle relaxant with minimal relief.  History and physical is consistent with a pinched nerve likely possibly sciatica also.  Has no midline spinal tenderness.  No specific trauma.  No concern for cauda equina given no loss of bowel or bladder, no saddle anesthesia, no weakness or numbness.  Neuro exam is normal except for antalgic gait.  Pregnancy test is negative.  Will trial steroids as believe nerve root irritation/mild disc herniation.  Recommend follow-up with primary care doctor she may need referral to PT/spine.  Recommend continued use of Motrin, Tylenol, muscle relaxant as well.  This chart was dictated using voice recognition software.  Despite best efforts to proofread,  errors can occur which can change the documentation meaning.    Final Clinical Impression(s) / ED Diagnoses Final diagnoses:  Acute left-sided low back pain with sciatica, sciatica laterality unspecified    Rx / DC Orders ED Discharge Orders         Ordered    methylPREDNISolone (MEDROL DOSEPAK) 4 MG TBPK tablet     08/30/19 0801           Lennice Sites, DO 08/30/19 0802

## 2019-08-30 NOTE — ED Triage Notes (Signed)
Pt here with left sided back pain x 1 month that shoots down leg.

## 2019-08-30 NOTE — Discharge Instructions (Signed)
Continue Mobic, muscle relaxant as needed.  Recommend also using 1000 mg of Tylenol up to 4 times a day.  Consider buying lidocaine patches over-the-counter as well.  Recommend light stretching.  Follow-up with your primary care doctor as I suspect you may need physical therapy.

## 2019-11-25 ENCOUNTER — Encounter (HOSPITAL_COMMUNITY): Payer: Self-pay | Admitting: Emergency Medicine

## 2019-11-25 ENCOUNTER — Ambulatory Visit (HOSPITAL_COMMUNITY)
Admission: EM | Admit: 2019-11-25 | Discharge: 2019-11-25 | Disposition: A | Discharge status: Home / Self Care | Payer: HRSA Program | Attending: Emergency Medicine | Admitting: Emergency Medicine

## 2019-11-25 ENCOUNTER — Other Ambulatory Visit: Payer: Self-pay

## 2019-11-25 DIAGNOSIS — Z20822 Contact with and (suspected) exposure to covid-19: Secondary | ICD-10-CM | POA: Insufficient documentation

## 2019-11-25 DIAGNOSIS — J069 Acute upper respiratory infection, unspecified: Secondary | ICD-10-CM

## 2019-11-25 MED ORDER — IBUPROFEN 800 MG PO TABS: 800.0000 mg | ORAL_TABLET | Freq: Three times a day (TID) | ORAL | 0 refills | Status: DC

## 2019-11-25 NOTE — Discharge Instructions (Signed)
Covid test pending, monitor my chart for results Tylenol and ibuprofen for body aches, fever, headache Please begin daily allergy tablet, Flonase nasal spray and continue Singulair to help with nasal congestion and drainage Rest and fluids Follow-up if any symptoms not improving or worsening

## 2019-11-25 NOTE — ED Triage Notes (Signed)
Pt c/o of exposure to covid. Pt c/o of body aches, chills, fatigue and  100.1 fever yesterday. Pt sts she works in a [employer] and her dentist tested positive 1.5 weeks ago.

## 2019-11-26 LAB — SARS CORONAVIRUS 2 (TAT 6-24 HRS): SARS Coronavirus 2: NEGATIVE

## 2019-11-27 NOTE — ED Provider Notes (Signed)
[facility]    CSN: [id number] Arrival date & time: 11/25/19  1638      History   Chief Complaint Chief Complaint  Patient presents with  . Generalized Body Aches    HPI Heidi Pearson is a 44 y.o. female presenting today for evaluation of URI symptoms and body aches.  Patient reports that beginning Sunday she has had body aches, chills and fatigue.  She has had mild sore throat as well as some ear discomfort.  She has noticed decreased appetite.  She denies chest pain or shortness of breath.  Does report exposure to Covid at work.  Fever up to 100.1 yesterday.  HPI  Past Medical History:  Diagnosis Date  . ADD (attention deficit disorder)   . Anxiety   . Anxiety   . Chronic knee pain   . Depression   . PTSD (post-traumatic stress disorder)     Patient Active Problem List   Diagnosis Date Noted  . Alcohol dependence (HCC) 12/12/2013  . ADHD (attention deficit hyperactivity disorder) 10/09/2012  . Panic attacks 10/08/2012  . PTSD (post-traumatic stress disorder) 10/08/2012  . MDD (major depressive disorder) 10/08/2012  . Chronic knee pain   . Anxiety     Past Surgical History:  Procedure Laterality Date  . CESAREAN SECTION      OB History   No obstetric history on file.      Home Medications    Prior to Admission medications   Medication Sig Start Date End Date Taking? Authorizing Provider  Cholecalciferol (VITAMIN D PO) Take 1 tablet by mouth daily.   Yes [provider]  clonazePAM (KLONOPIN) 0.5 MG tablet Take 0.5 mg by mouth 2 (two) times daily as needed. 07/31/19  Yes [provider]  Multiple Vitamins-Minerals (ZINC PO) Take 1 tablet by mouth daily.   Yes [provider]  VYVANSE 50 MG capsule Take 50 mg by mouth daily. 08/28/19  Yes [provider]  acetaminophen (TYLENOL) 500 MG tablet Take 500 mg by mouth every 6 (six) hours as needed for mild pain, moderate pain, fever or headache.    [provider]  ALPRAZolam Prudy Feeler) 0.5 MG tablet alprazolam 0.5 mg tablet  TK 1-2 TS PO TID PRN    [provider]  Biotin 1000 MCG tablet Take 1,000 mcg by mouth 3 (three) times daily.    [provider]  ibuprofen (ADVIL) 800 MG tablet Take 1 tablet (800 mg total) by mouth 3 (three) times daily. 11/25/19   Deysy Schabel C, PA-C  loratadine (CLARITIN) 10 MG tablet Take 10 mg by mouth daily.    [provider]  medroxyPROGESTERone (DEPO-PROVERA) 150 MG/ML injection Inject 150 mg into the muscle every 3 (three) months.    [provider]  methylPREDNISolone (MEDROL DOSEPAK) 4 MG TBPK tablet Follow package insert 08/30/19   Curatolo, Adam, DO  omeprazole (PRILOSEC) 20 MG capsule Take 1 capsule (20 mg total) by mouth 2 (two) times daily before a meal. 04/19/17   Loren Racer, MD  ondansetron (ZOFRAN-ODT) 4 MG disintegrating tablet Take 1 tablet (4 mg total) by mouth every 8 (eight) hours as needed for nausea or vomiting. 07/12/18   Georgetta Haber, NP  traMADol (ULTRAM) 50 MG tablet Take 1 tablet (50 mg total) by mouth every 6 (six) hours as needed. 07/12/18   Georgetta Haber, NP    Family History Family History  Problem Relation Age of Onset  . Hypertension Mother   .  Cancer Mother   . Hypertension Father     Social History Social History   Tobacco Use  . Smoking status: Never Smoker  . Smokeless tobacco: Never Used  Vaping Use  . Vaping Use: Never used  Substance Use Topics  . Alcohol use: Yes    Comment: socially   . Drug use: No    Types: Oxycodone, Hydrocodone    Comment: Quit 04/11/2012 herion two years ago     Allergies   Morphine and related   Review of Systems Review of Systems  Constitutional: Positive for appetite change and fatigue. Negative for activity change, chills and fever.  HENT: Positive for congestion, rhinorrhea and sore throat. Negative for ear pain, sinus pressure and trouble swallowing.   Eyes: Negative for  discharge and redness.  Respiratory: Negative for cough, chest tightness and shortness of breath.   Cardiovascular: Negative for chest pain.  Gastrointestinal: Negative for abdominal pain, diarrhea, nausea and vomiting.  Musculoskeletal: Negative for myalgias.  Skin: Negative for rash.  Neurological: Negative for dizziness, light-headedness and headaches.     Physical Exam Triage Vital Signs ED Triage Vitals  Enc Vitals Group     BP 11/25/19 1858 116/85     Pulse Rate 11/25/19 1858 82     Resp 11/25/19 1858 16     Temp 11/25/19 1858 98.1 F (36.7 C)     Temp Source 11/25/19 1858 Oral     SpO2 11/25/19 1858 100 %     Weight --      Height --      Head Circumference --      Peak Flow --      Pain Score 11/25/19 1923 4     Pain Loc --      Pain Edu? --      Excl. in GC? --    No data found.  Updated Vital Signs BP 116/85 (BP Location: Right Arm)   Pulse 82   Temp 98.1 F (36.7 C) (Oral)   Resp 16   LMP  (LMP Unknown)   SpO2 100%   Visual Acuity Right Eye Distance:   Left Eye Distance:   Bilateral Distance:    Right Eye Near:   Left Eye Near:    Bilateral Near:     Physical Exam Vitals and nursing note reviewed.  Constitutional:      Appearance: She is well-developed.     Comments: No acute distress  HENT:     Head: Normocephalic and atraumatic.     Ears:     Comments: Bilateral ears without tenderness to palpation of external auricle, tragus and mastoid, EAC's without erythema or swelling, TM's with good bony landmarks and cone of light. Non erythematous.     Nose: Nose normal.     Mouth/Throat:     Comments: Oral mucosa pink and moist, no tonsillar enlargement or exudate. Posterior pharynx patent and nonerythematous, no uvula deviation or swelling. Normal phonation.  Eyes:     Conjunctiva/sclera: Conjunctivae normal.  Cardiovascular:     Rate and Rhythm: Normal rate.  Pulmonary:     Effort: Pulmonary effort is normal. No respiratory distress.      Comments: Breathing comfortably at rest, CTABL, no wheezing, rales or other adventitious sounds auscultated  Abdominal:     General: There is no distension.  Musculoskeletal:        General: Normal range of motion.     Cervical back: Neck supple.  Skin:    General: Skin is  warm and dry.  Neurological:     Mental Status: She is alert and oriented to person, place, and time.      UC Treatments / Results  Labs (all labs ordered are listed, but only abnormal results are displayed) Labs Reviewed  SARS CORONAVIRUS 2 (TAT 6-24 HRS)    EKG   Radiology No results found.  Procedures Procedures (including critical care time)  Medications Ordered in UC Medications - No data to display  Initial Impression / Assessment and Plan / UC Course  I have reviewed the triage vital signs and the nursing notes.  Pertinent labs & imaging results that were available during my care of the patient were reviewed by me and considered in my medical decision making (see chart for details).     COVID exposure, PCR pending, symptoms times few days.  Recommending symptomatic and supportive care, suspect viral etiology.  Lungs clear, vital signs stable.Discussed strict return precautions. Patient verbalized understanding and is agreeable with plan.  Final Clinical Impressions(s) / UC Diagnoses   Final diagnoses:  Viral URI with cough     Discharge Instructions     Covid test pending, monitor my chart for results Tylenol and ibuprofen for body aches, fever, headache Please begin daily allergy tablet, Flonase nasal spray and continue Singulair to help with nasal congestion and drainage Rest and fluids Follow-up if any symptoms not improving or worsening   ED Prescriptions    Medication Sig Dispense Auth. Provider   ibuprofen (ADVIL) 800 MG tablet Take 1 tablet (800 mg total) by mouth 3 (three) times daily. 21 tablet Esraa Seres, [place] C, PA-C     PDMP not reviewed this encounter.   Lew Dawes, PA-C 11/27/19 1131

## 2021-01-13 ENCOUNTER — Encounter: Payer: Self-pay | Admitting: Internal Medicine

## 2021-02-11 ENCOUNTER — Ambulatory Visit: Payer: Self-pay | Admitting: Internal Medicine

## 2021-02-18 ENCOUNTER — Encounter: Payer: Self-pay | Admitting: Internal Medicine

## 2021-03-11 ENCOUNTER — Ambulatory Visit: Payer: BC Managed Care – PPO | Admitting: Internal Medicine

## 2021-03-15 ENCOUNTER — Encounter: Payer: Self-pay | Admitting: Physician Assistant

## 2022-01-11 ENCOUNTER — Other Ambulatory Visit: Payer: Self-pay

## 2022-01-11 ENCOUNTER — Emergency Department (HOSPITAL_BASED_OUTPATIENT_CLINIC_OR_DEPARTMENT_OTHER)
Admission: EM | Admit: 2022-01-11 | Discharge: 2022-01-11 | Disposition: A | Discharge status: Home / Self Care | Payer: BC Managed Care – PPO | Attending: Emergency Medicine | Admitting: Emergency Medicine

## 2022-01-11 ENCOUNTER — Encounter (HOSPITAL_BASED_OUTPATIENT_CLINIC_OR_DEPARTMENT_OTHER): Payer: Self-pay

## 2022-01-11 DIAGNOSIS — F1910 Other psychoactive substance abuse, uncomplicated: Secondary | ICD-10-CM | POA: Insufficient documentation

## 2022-01-11 DIAGNOSIS — R059 Cough, unspecified: Secondary | ICD-10-CM | POA: Insufficient documentation

## 2022-01-11 MED ORDER — BUPRENORPHINE HCL-NALOXONE HCL 8-2 MG SL SUBL
1.0000 | SUBLINGUAL_TABLET | Freq: Every day | SUBLINGUAL | Status: DC
  Administered 2022-01-11: 1 via SUBLINGUAL
  Filled 2022-01-11: qty 1

## 2022-01-11 MED ORDER — CLONAZEPAM 2 MG PO TABS
2.0000 mg | ORAL_TABLET | Freq: Once | ORAL | Status: DC
  Filled 2022-01-11: qty 1

## 2022-01-11 MED ORDER — CHLORDIAZEPOXIDE HCL 25 MG PO CAPS
100.0000 mg | ORAL_CAPSULE | Freq: Once | ORAL | Status: AC
  Administered 2022-01-11: 100 mg via ORAL
  Filled 2022-01-11: qty 4

## 2022-01-11 NOTE — ED Notes (Signed)
Pt states  she is suicidal without a plan. Charge notified, room cleared and cabinets locked, MD notified.

## 2022-01-11 NOTE — ED Notes (Signed)
Pt friend at bedside 

## 2022-01-11 NOTE — ED Triage Notes (Signed)
Pt BIB EMS for withdrawal. Stopped taking klonipin, gabapentin, suboxone, vraylar, wellbutrin 3 days ago.  Scheduled detox for tomorrow, but couldn't wait. Having bodyaches, chills, leg pain, abdominal pain/nausea.

## 2022-01-11 NOTE — ED Triage Notes (Signed)
Pt stopped taking all meds she was taking, suboxin, clonopin, gabapentin ,Wellbutrin, vylar. Pt took zofran today for n/v. Pt did drink a 1.5 cup of wine today. Has a appt tomarrow at detox in [place].  Has body aches , headache, feels like hot pins are sticking  her. Alert to herself , took 3 min to tell me VP, only knows she is in a hospital.

## 2022-01-11 NOTE — ED Provider Notes (Signed)
[facility] EMERGENCY DEPARTMENT Provider Note   CSN: [id number] Arrival date & time: 01/11/22  1743     History  Chief Complaint  Patient presents with   Withdrawal   Suicidal    Heidi Pearson is a 46 y.o. female.  46 yo F with a chief complaints of not feeling well.  The patient told me that she decided to stop taking all of her medications earlier today because she did not want to have any of that stuff in her body.  She has an appointment for detox tomorrow and aspirin but started feeling so bad that she decided come here to be evaluated.  She has been coughing a little bit but denies any fevers or chills.  She did drink before coming into the emergency department.        Home Medications Prior to Admission medications   Medication Sig Start Date End Date Taking? Authorizing Provider  acetaminophen (TYLENOL) 500 MG tablet Take 500 mg by mouth every 6 (six) hours as needed for mild pain, moderate pain, fever or headache.    [provider]  ALPRAZolam Prudy Feeler) 0.5 MG tablet alprazolam 0.5 mg tablet  TK 1-2 TS PO TID PRN    [provider]  Biotin 1000 MCG tablet Take 1,000 mcg by mouth 3 (three) times daily.    [provider]  Cholecalciferol (VITAMIN D PO) Take 1 tablet by mouth daily.    [provider]  clonazePAM (KLONOPIN) 0.5 MG tablet Take 0.5 mg by mouth 2 (two) times daily as needed. 07/31/19   [provider]  ibuprofen (ADVIL) 800 MG tablet Take 1 tablet (800 mg total) by mouth 3 (three) times daily. 11/25/19   Wieters, Hallie C, PA-C  loratadine (CLARITIN) 10 MG tablet Take 10 mg by mouth daily.    [provider]  medroxyPROGESTERone (DEPO-PROVERA) 150 MG/ML injection Inject 150 mg into the muscle every 3 (three) months.    [provider]  methylPREDNISolone (MEDROL DOSEPAK) 4 MG TBPK tablet Follow package insert 08/30/19   Curatolo, Adam, DO  Multiple Vitamins-Minerals (ZINC PO) Take 1  tablet by mouth daily.    [provider]  omeprazole (PRILOSEC) 20 MG capsule Take 1 capsule (20 mg total) by mouth 2 (two) times daily before a meal. 04/19/17   Loren Racer, MD  ondansetron (ZOFRAN-ODT) 4 MG disintegrating tablet Take 1 tablet (4 mg total) by mouth every 8 (eight) hours as needed for nausea or vomiting. 07/12/18   Georgetta Haber, NP  traMADol (ULTRAM) 50 MG tablet Take 1 tablet (50 mg total) by mouth every 6 (six) hours as needed. 07/12/18   Georgetta Haber, NP  VYVANSE 50 MG capsule Take 50 mg by mouth daily. 08/28/19   [provider]      Allergies    Morphine and related    Review of Systems   Review of Systems  Physical Exam Updated Vital Signs BP 122/73 (BP Location: Left Arm)   Pulse 93   Temp 98.9 F (37.2 C)   Resp 20   Ht 5\' 3"  (1.6 m)   Wt 83.9 kg   SpO2 92%   BMI 32.77 kg/m  Physical Exam Vitals and nursing note reviewed.  Constitutional:      General: She is not in acute distress.    Appearance: She is well-developed. She is not diaphoretic.     Comments: Patient is clinically intoxicated  HENT:     Head: Normocephalic and  atraumatic.  Eyes:     Pupils: Pupils are equal, round, and reactive to light.  Cardiovascular:     Rate and Rhythm: Normal rate and regular rhythm.     Heart sounds: No murmur heard.    No friction rub. No gallop.  Pulmonary:     Effort: Pulmonary effort is normal.     Breath sounds: No wheezing or rales.  Abdominal:     General: There is no distension.     Palpations: Abdomen is soft.     Tenderness: There is no abdominal tenderness.  Musculoskeletal:        General: No tenderness.     Cervical back: Normal range of motion and neck supple.  Skin:    General: Skin is warm and dry.  Neurological:     Mental Status: She is alert and oriented to person, place, and time.  Psychiatric:        Behavior: Behavior normal.     ED Results / Procedures / Treatments   Labs (all labs ordered are  listed, but only abnormal results are displayed) Labs Reviewed - No data to display  EKG EKG Interpretation  Date/Time:  Wednesday January 11 2022 18:01:01 EDT Ventricular Rate:  91 PR Interval:  172 QRS Duration: 110 QT Interval:  375 QTC Calculation: 462 R Axis:   71 Text Interpretation: Sinus rhythm Low voltage, precordial leads Probable anteroseptal infarct, old No significant change since last tracing Confirmed by Melene Plan [phone]) on 01/11/2022 6:15:07 PM  Radiology No results found.  Procedures Procedures    Medications Ordered in ED Medications  buprenorphine-naloxone (SUBOXONE) 8-2 mg per SL tablet 1 tablet (1 tablet Sublingual Given 01/11/22 1826)  chlordiazePOXIDE (LIBRIUM) capsule 100 mg (100 mg Oral Given 01/11/22 1834)    ED Course/ Medical Decision Making/ A&P                           Medical Decision Making Risk Prescription drug management.   46 yo F with a chief complaint patient feeling unwell after stopping her Suboxone and Klonopin earlier today.  She did drink somewhat heavily today but that did not seem to help and so she came here for evaluation.  She has an appointment at a rehab facility and after up tomorrow.  We will give a dose of her home Suboxone here.  Dose of Klonopin.  We do not have Klonopin on formulary.  Patient was given a dose of Librium.  Patient's vital signs are stable.  I feel she is medically clear.  I discussed with her about following up with her scheduled rehab but did give her information for the behavioral health urgent care center if she so chooses.  6:36 PM:  I have discussed the diagnosis/risks/treatment options with the patient and family.  Evaluation and diagnostic testing in the emergency department does not suggest an emergent condition requiring admission or immediate intervention beyond what has been performed at this time.  They will follow up with  PCP, BH, rehab. We also discussed returning to the ED immediately if  new or worsening sx occur. We discussed the sx which are most concerning (e.g., sudden worsening pain, fever, inability to tolerate by mouth) that necessitate immediate return. Medications administered to the patient during their visit and any new prescriptions provided to the patient are listed below.  Medications given during this visit Medications  buprenorphine-naloxone (SUBOXONE) 8-2 mg per SL tablet 1 tablet (1 tablet Sublingual Given  01/11/22 1826)  chlordiazePOXIDE (LIBRIUM) capsule 100 mg (100 mg Oral Given 01/11/22 1834)     The patient appears reasonably screen and/or stabilized for discharge and I doubt any other medical condition or other [facility] requiring further screening, evaluation, or treatment in the ED at this time prior to discharge.          Final Clinical Impression(s) / ED Diagnoses Final diagnoses:  Polysubstance abuse [facility])    Rx / DC Orders ED Discharge Orders     None         Melene Plan, DO 01/11/22 1836

## 2022-01-13 ENCOUNTER — Ambulatory Visit (HOSPITAL_COMMUNITY)
Admission: EM | Admit: 2022-01-13 | Discharge: 2022-01-14 | Disposition: A | Discharge status: Other Acute Inpatient Hospital | Payer: No Payment, Other | Attending: Family | Admitting: Family

## 2022-01-13 DIAGNOSIS — F1910 Other psychoactive substance abuse, uncomplicated: Secondary | ICD-10-CM | POA: Insufficient documentation

## 2022-01-13 DIAGNOSIS — F1020 Alcohol dependence, uncomplicated: Secondary | ICD-10-CM | POA: Diagnosis not present

## 2022-01-13 DIAGNOSIS — Z20822 Contact with and (suspected) exposure to covid-19: Secondary | ICD-10-CM | POA: Diagnosis not present

## 2022-01-13 DIAGNOSIS — F909 Attention-deficit hyperactivity disorder, unspecified type: Secondary | ICD-10-CM | POA: Insufficient documentation

## 2022-01-13 DIAGNOSIS — Z9151 Personal history of suicidal behavior: Secondary | ICD-10-CM | POA: Insufficient documentation

## 2022-01-13 DIAGNOSIS — F332 Major depressive disorder, recurrent severe without psychotic features: Secondary | ICD-10-CM | POA: Diagnosis not present

## 2022-01-13 DIAGNOSIS — R45851 Suicidal ideations: Secondary | ICD-10-CM | POA: Insufficient documentation

## 2022-01-13 DIAGNOSIS — Y929 Unspecified place or not applicable: Secondary | ICD-10-CM | POA: Insufficient documentation

## 2022-01-13 DIAGNOSIS — F4310 Post-traumatic stress disorder, unspecified: Secondary | ICD-10-CM | POA: Insufficient documentation

## 2022-01-13 DIAGNOSIS — Y939 Activity, unspecified: Secondary | ICD-10-CM | POA: Insufficient documentation

## 2022-01-13 DIAGNOSIS — F1310 Sedative, hypnotic or anxiolytic abuse, uncomplicated: Secondary | ICD-10-CM

## 2022-01-13 DIAGNOSIS — Z634 Disappearance and death of family member: Secondary | ICD-10-CM | POA: Insufficient documentation

## 2022-01-13 DIAGNOSIS — S0083XA Contusion of other part of head, initial encounter: Secondary | ICD-10-CM | POA: Insufficient documentation

## 2022-01-13 DIAGNOSIS — F419 Anxiety disorder, unspecified: Secondary | ICD-10-CM | POA: Insufficient documentation

## 2022-01-13 DIAGNOSIS — W19XXXA Unspecified fall, initial encounter: Secondary | ICD-10-CM | POA: Insufficient documentation

## 2022-01-13 LAB — CBC WITH DIFFERENTIAL/PLATELET
Abs Immature Granulocytes: 0.02 10*3/uL (ref 0.00–0.07)
Basophils Absolute: 0 10*3/uL (ref 0.0–0.1)
Basophils Relative: 0 %
Eosinophils Absolute: 0.1 10*3/uL (ref 0.0–0.5)
Eosinophils Relative: 2 %
HCT: 36.4 % (ref 36.0–46.0)
Hemoglobin: 12.3 g/dL (ref 12.0–15.0)
Immature Granulocytes: 0 %
Lymphocytes Relative: 26 %
Lymphs Abs: 1.8 10*3/uL (ref 0.7–4.0)
MCH: 29.7 pg (ref 26.0–34.0)
MCHC: 33.8 g/dL (ref 30.0–36.0)
MCV: 87.9 fL (ref 80.0–100.0)
Monocytes Absolute: 0.5 10*3/uL (ref 0.1–1.0)
Monocytes Relative: 7 %
Neutro Abs: 4.6 10*3/uL (ref 1.7–7.7)
Neutrophils Relative %: 65 %
Platelets: 350 10*3/uL (ref 150–400)
RBC: 4.14 MIL/uL (ref 3.87–5.11)
RDW: 12.1 % (ref 11.5–15.5)
WBC: 7.2 10*3/uL (ref 4.0–10.5)
nRBC: 0 % (ref 0.0–0.2)

## 2022-01-13 LAB — COMPREHENSIVE METABOLIC PANEL
ALT: 35 U/L (ref 0–44)
AST: 30 U/L (ref 15–41)
Albumin: 3.6 g/dL (ref 3.5–5.0)
Alkaline Phosphatase: 82 U/L (ref 38–126)
Anion gap: 12 (ref 5–15)
BUN: 10 mg/dL (ref 6–20)
CO2: 27 mmol/L (ref 22–32)
Calcium: 9.6 mg/dL (ref 8.9–10.3)
Chloride: 102 mmol/L (ref 98–111)
Creatinine, Ser: 0.78 mg/dL (ref 0.44–1.00)
GFR, Estimated: >60 mL/min (ref >60)
Glucose, Bld: 103 mg/dL — ABNORMAL HIGH (ref 70–99)
Potassium: 3.9 mmol/L (ref 3.5–5.1)
Sodium: 141 mmol/L (ref 135–145)
Total Bilirubin: 0.5 mg/dL (ref 0.3–1.2)
Total Protein: 6.5 g/dL (ref 6.5–8.1)

## 2022-01-13 LAB — MAGNESIUM: Magnesium: 1.9 mg/dL (ref 1.7–2.4)

## 2022-01-13 LAB — TSH: TSH: 1.083 u[IU]/mL (ref 0.350–4.500)

## 2022-01-13 LAB — POCT PREGNANCY, URINE: Preg Test, Ur: NEGATIVE

## 2022-01-13 LAB — RESP PANEL BY RT-PCR (FLU A&B, COVID) ARPGX2
Influenza A by PCR: NEGATIVE
Influenza B by PCR: NEGATIVE
SARS Coronavirus 2 by RT PCR: NEGATIVE

## 2022-01-13 LAB — HEMOGLOBIN A1C
Hgb A1c MFr Bld: 5.4 % (ref 4.8–5.6)
Mean Plasma Glucose: 108.28 mg/dL

## 2022-01-13 LAB — LIPID PANEL
Cholesterol: 215 mg/dL — ABNORMAL HIGH (ref 0–200)
HDL: 44 mg/dL (ref >40)
LDL Cholesterol: 129 mg/dL — ABNORMAL HIGH (ref 0–99)
Total CHOL/HDL Ratio: 4.9 RATIO
Triglycerides: 208 mg/dL — ABNORMAL HIGH (ref <150)
VLDL: 42 mg/dL — ABNORMAL HIGH (ref 0–40)

## 2022-01-13 LAB — POCT URINE DRUG SCREEN - MANUAL ENTRY (I-SCREEN)
POC Amphetamine UR: NOT DETECTED
POC Buprenorphine (BUP): POSITIVE — AB
POC Cocaine UR: NOT DETECTED
POC Marijuana UR: POSITIVE — AB
POC Methadone UR: NOT DETECTED
POC Methamphetamine UR: NOT DETECTED
POC Morphine: NOT DETECTED
POC Oxazepam (BZO): POSITIVE — AB
POC Oxycodone UR: NOT DETECTED
POC Secobarbital (BAR): NOT DETECTED

## 2022-01-13 LAB — POC SARS CORONAVIRUS 2 AG: SARSCOV2ONAVIRUS 2 AG: NEGATIVE

## 2022-01-13 LAB — ETHANOL: Alcohol, Ethyl (B): <10 mg/dL (ref <10)

## 2022-01-13 MED ORDER — THIAMINE HCL 100 MG/ML IJ SOLN
100.0000 mg | Freq: Once | INTRAMUSCULAR | Status: DC
  Filled 2022-01-13: qty 2

## 2022-01-13 MED ORDER — LORAZEPAM 1 MG PO TABS: 1.0000 mg | ORAL_TABLET | Freq: Two times a day (BID) | ORAL | Status: DC

## 2022-01-13 MED ORDER — ACETAMINOPHEN 325 MG PO TABS: 650.0000 mg | ORAL_TABLET | Freq: Four times a day (QID) | ORAL | Status: DC | PRN

## 2022-01-13 MED ORDER — DICYCLOMINE HCL 20 MG PO TABS
20.0000 mg | ORAL_TABLET | Freq: Four times a day (QID) | ORAL | Status: DC | PRN
  Administered 2022-01-13: 20 mg via ORAL
  Filled 2022-01-13: qty 1

## 2022-01-13 MED ORDER — NAPROXEN 500 MG PO TABS
500.0000 mg | ORAL_TABLET | Freq: Two times a day (BID) | ORAL | Status: DC | PRN
  Administered 2022-01-13: 500 mg via ORAL
  Filled 2022-01-13: qty 1

## 2022-01-13 MED ORDER — BUPROPION HCL ER (XL) 150 MG PO TB24: 150.0000 mg | ORAL_TABLET | Freq: Every day | ORAL | Status: DC

## 2022-01-13 MED ORDER — TRAZODONE HCL 50 MG PO TABS
50.0000 mg | ORAL_TABLET | Freq: Once | ORAL | Status: DC
  Filled 2022-01-13: qty 1

## 2022-01-13 MED ORDER — CLONIDINE HCL 0.1 MG PO TABS: 0.1000 mg | ORAL_TABLET | ORAL | Status: DC

## 2022-01-13 MED ORDER — METHOCARBAMOL 500 MG PO TABS
500.0000 mg | ORAL_TABLET | Freq: Three times a day (TID) | ORAL | Status: DC | PRN
  Administered 2022-01-13: 500 mg via ORAL
  Filled 2022-01-13: qty 1

## 2022-01-13 MED ORDER — ONDANSETRON 4 MG PO TBDP
4.0000 mg | ORAL_TABLET | Freq: Four times a day (QID) | ORAL | Status: DC | PRN
  Administered 2022-01-13: 4 mg via ORAL
  Filled 2022-01-13: qty 1

## 2022-01-13 MED ORDER — CLONIDINE HCL 0.1 MG PO TABS: 0.1000 mg | ORAL_TABLET | Freq: Every day | ORAL | Status: DC

## 2022-01-13 MED ORDER — HYDROXYZINE HCL 25 MG PO TABS: 25.0000 mg | ORAL_TABLET | Freq: Four times a day (QID) | ORAL | Status: DC | PRN

## 2022-01-13 MED ORDER — MELATONIN 3 MG PO TABS
3.0000 mg | ORAL_TABLET | Freq: Every evening | ORAL | Status: DC | PRN
Start: 2022-01-13 — End: 2022-01-14
  Administered 2022-01-13: 3 mg via ORAL
  Filled 2022-01-13: qty 1

## 2022-01-13 MED ORDER — LORAZEPAM 1 MG PO TABS: 1.0000 mg | ORAL_TABLET | Freq: Three times a day (TID) | ORAL | Status: DC

## 2022-01-13 MED ORDER — MAGNESIUM HYDROXIDE 400 MG/5ML PO SUSP: 30.0000 mL | Freq: Every day | ORAL | Status: DC | PRN

## 2022-01-13 MED ORDER — LOPERAMIDE HCL 2 MG PO CAPS: 2.0000 mg | ORAL_CAPSULE | ORAL | Status: DC | PRN

## 2022-01-13 MED ORDER — LORAZEPAM 1 MG PO TABS: 1.0000 mg | ORAL_TABLET | Freq: Every day | ORAL | Status: DC

## 2022-01-13 MED ORDER — LORAZEPAM 1 MG PO TABS: 1.0000 mg | ORAL_TABLET | Freq: Four times a day (QID) | ORAL | Status: DC | PRN

## 2022-01-13 MED ORDER — THIAMINE MONONITRATE 100 MG PO TABS: 100.0000 mg | ORAL_TABLET | Freq: Every day | ORAL | Status: DC

## 2022-01-13 MED ORDER — ADULT MULTIVITAMIN W/MINERALS CH
1.0000 | ORAL_TABLET | Freq: Every day | ORAL | Status: DC
  Filled 2022-01-13: qty 1

## 2022-01-13 MED ORDER — ALUM & MAG HYDROXIDE-SIMETH 200-200-20 MG/5ML PO SUSP: 30.0000 mL | ORAL | Status: DC | PRN

## 2022-01-13 MED ORDER — CLONIDINE HCL 0.1 MG PO TABS
0.1000 mg | ORAL_TABLET | Freq: Four times a day (QID) | ORAL | Status: DC
  Administered 2022-01-13 (×2): 0.1 mg via ORAL
  Filled 2022-01-13 (×2): qty 1

## 2022-01-13 MED ORDER — LORAZEPAM 1 MG PO TABS
1.0000 mg | ORAL_TABLET | Freq: Four times a day (QID) | ORAL | Status: DC
  Administered 2022-01-13 (×2): 1 mg via ORAL
  Filled 2022-01-13 (×2): qty 1

## 2022-01-13 NOTE — BH Assessment (Deleted)
LCSW Progress Note   Per Doran Heater, FNP, this pt does not require psychiatric hospitalization at this time.  Pt is psychiatrically cleared.  Discharge instructions include several resources for Level I care with outpatient mental health and substance use as well as rehab/detox facilities and PHP/IOP.Marland Kitchen  EDP Doran Heater, FNP, has been notified.  Hansel Starling, MSW, LCSW [facility] [phone] or [phone]

## 2022-01-13 NOTE — ED Provider Notes (Addendum)
Notified by nursing staff that patient sustained an unwitnessed fall. This NP presented to the unit to assess patient. On assessment, patient is sitting on the side of her bed. She is alert and oriented X4,  speech is clear and coherent, facial expression are symmetrical, pupil reactive 4 mm, No motor deficits noted, muscle strength 4/5 bilaterally, sensation is intact bilaterally. She says she bent over to use the restroom, her legs gave out and she woke up on the floor. She is unsure if she loss conscientiousness . Per nursing staff, patient was in the restroom for approximately 10 minutes. She is noted with a hematoma to her right forehead. She is complaining of a slight head pain 3/10. She denies SOB, CP, dizziness, visual disturbances, numbness, tingling, or weakness. See vital signs below.   Patient patient received clonidine 0.2mg , Ativan 2mg , melatonin 3mg , Robaxin 500mg  between 17:08 and 21:08 yesterday (01/13/22).  Patient will be transferred to [facility] for medical clearance. She may return to [facility] post medical clearance. Report called to EDP Dr. .   Patient is under IVC    Today's Vitals   01/13/22 2250 01/13/22 2342 01/13/22 2343 01/14/22 0000  BP: 113/68 116/72 122/84 122/84  Pulse: 62 67 (!) 58 (!) 58  Resp: 18 16 18 18   Temp: 98.1 F (36.7 C)  98.1 F (36.7 C) 98.1 F (36.7 C)  TempSrc: Oral  Oral   SpO2: 97% 98% 98% 98%  PainSc:
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
## 2022-01-13 NOTE — ED Notes (Signed)
Pt just arrived on the unit

## 2022-01-13 NOTE — ED Notes (Signed)
Patient c/o inability to sleep and stomach cramps - prn Bentyl given - will continue to monitor for safety

## 2022-01-13 NOTE — ED Notes (Signed)
Pt is currently IVC'd.  Pt was upset she would not be going home.  Provider spoke with pt.  Pt reports she is worried about not getting her suboxone medication while in facility.  Comfort medications have been provided.  Pt also provided with sweater and warm blanket.  Will continue to monitor for safety.
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
## 2022-01-13 NOTE — ED Notes (Signed)
STAT lab courier called to transport labs to [facility] lab 

## 2022-01-13 NOTE — ED Notes (Signed)
Patient found laying on the floor in the bathroom -  VSS - obvious bump on her right forehead - provider made aware and came to bedside to assess - will send to ED for evaluation

## 2022-01-13 NOTE — Progress Notes (Signed)
Received Heidi Pearson in the OBS area, she looked frighten and this Clinical research associate talked with her one to one. She relaizes she needs help, but does not feel comfortable here on the unit related to the openness of the environment. She was informed of the plan to find her a bed for long term detox. Later she decided to call her friend who brought her here and asked her to come and pick her up.

## 2022-01-13 NOTE — ED Notes (Signed)
Heidi Pearson is sitting at bed side watching television affect flat but mood is quiet and reserved behavior is self controlled.

## 2022-01-13 NOTE — ED Notes (Signed)
Denies SI, Hi, AVH at this time - will continue to monitor for safety

## 2022-01-13 NOTE — ED Provider Notes (Incomplete)
Notified by nursing staff that patient sustained an unwitnessed fall. This NP presented to the unit to assess patient. On assessment, patient is sitting on the side of her bed. She is alert and oriented X4. She says she bent over to use the restroom and woke up on the floor. She is noted with hematoma to her right forehead. She

## 2022-01-14 ENCOUNTER — Emergency Department (HOSPITAL_COMMUNITY): Payer: Self-pay

## 2022-01-14 ENCOUNTER — Emergency Department (HOSPITAL_COMMUNITY)
Admission: EM | Admit: 2022-01-14 | Discharge: 2022-01-14 | Disposition: A | Discharge status: Home / Self Care | Payer: Self-pay | Attending: Emergency Medicine | Admitting: Emergency Medicine

## 2022-01-14 ENCOUNTER — Other Ambulatory Visit: Payer: Self-pay

## 2022-01-14 ENCOUNTER — Other Ambulatory Visit (HOSPITAL_COMMUNITY)
Admission: EM | Admit: 2022-01-14 | Discharge: 2022-01-15 | Disposition: A | Discharge status: Other Acute Inpatient Hospital | Payer: No Payment, Other | Source: Home / Self Care | Admitting: Psychiatry

## 2022-01-14 ENCOUNTER — Encounter (HOSPITAL_COMMUNITY): Payer: Self-pay | Admitting: Emergency Medicine

## 2022-01-14 DIAGNOSIS — M542 Cervicalgia: Secondary | ICD-10-CM | POA: Insufficient documentation

## 2022-01-14 DIAGNOSIS — F1020 Alcohol dependence, uncomplicated: Secondary | ICD-10-CM | POA: Insufficient documentation

## 2022-01-14 DIAGNOSIS — W19XXXA Unspecified fall, initial encounter: Secondary | ICD-10-CM | POA: Insufficient documentation

## 2022-01-14 DIAGNOSIS — Y92121 Bathroom in nursing home as the place of occurrence of the external cause: Secondary | ICD-10-CM | POA: Insufficient documentation

## 2022-01-14 DIAGNOSIS — Z9151 Personal history of suicidal behavior: Secondary | ICD-10-CM | POA: Insufficient documentation

## 2022-01-14 DIAGNOSIS — W182XXA Fall in (into) shower or empty bathtub, initial encounter: Secondary | ICD-10-CM | POA: Insufficient documentation

## 2022-01-14 DIAGNOSIS — F332 Major depressive disorder, recurrent severe without psychotic features: Secondary | ICD-10-CM | POA: Diagnosis present

## 2022-01-14 DIAGNOSIS — Y939 Activity, unspecified: Secondary | ICD-10-CM | POA: Insufficient documentation

## 2022-01-14 DIAGNOSIS — S0990XA Unspecified injury of head, initial encounter: Secondary | ICD-10-CM | POA: Insufficient documentation

## 2022-01-14 DIAGNOSIS — R45851 Suicidal ideations: Secondary | ICD-10-CM | POA: Insufficient documentation

## 2022-01-14 DIAGNOSIS — S0083XA Contusion of other part of head, initial encounter: Secondary | ICD-10-CM | POA: Insufficient documentation

## 2022-01-14 DIAGNOSIS — F419 Anxiety disorder, unspecified: Secondary | ICD-10-CM | POA: Insufficient documentation

## 2022-01-14 DIAGNOSIS — R55 Syncope and collapse: Secondary | ICD-10-CM | POA: Insufficient documentation

## 2022-01-14 DIAGNOSIS — Y929 Unspecified place or not applicable: Secondary | ICD-10-CM | POA: Insufficient documentation

## 2022-01-14 LAB — COMPREHENSIVE METABOLIC PANEL
ALT: 34 U/L (ref 0–44)
AST: 28 U/L (ref 15–41)
Albumin: 3.6 g/dL (ref 3.5–5.0)
Alkaline Phosphatase: 86 U/L (ref 38–126)
Anion gap: 9 (ref 5–15)
BUN: 11 mg/dL (ref 6–20)
CO2: 25 mmol/L (ref 22–32)
Calcium: 9 mg/dL (ref 8.9–10.3)
Chloride: 104 mmol/L (ref 98–111)
Creatinine, Ser: 0.81 mg/dL (ref 0.44–1.00)
GFR, Estimated: >60 mL/min (ref >60)
Glucose, Bld: 115 mg/dL — ABNORMAL HIGH (ref 70–99)
Potassium: 3.8 mmol/L (ref 3.5–5.1)
Sodium: 138 mmol/L (ref 135–145)
Total Bilirubin: 0.4 mg/dL (ref 0.3–1.2)
Total Protein: 6.7 g/dL (ref 6.5–8.1)

## 2022-01-14 LAB — CBG MONITORING, ED: Glucose-Capillary: 117 mg/dL — ABNORMAL HIGH (ref 70–99)

## 2022-01-14 LAB — PROLACTIN: Prolactin: 22.8 ng/mL (ref 4.8–23.3)

## 2022-01-14 LAB — CBC
HCT: 38.1 % (ref 36.0–46.0)
Hemoglobin: 12 g/dL (ref 12.0–15.0)
MCH: 29 pg (ref 26.0–34.0)
MCHC: 31.5 g/dL (ref 30.0–36.0)
MCV: 92 fL (ref 80.0–100.0)
Platelets: 332 10*3/uL (ref 150–400)
RBC: 4.14 MIL/uL (ref 3.87–5.11)
RDW: 12.1 % (ref 11.5–15.5)
WBC: 6.2 10*3/uL (ref 4.0–10.5)
nRBC: 0 % (ref 0.0–0.2)

## 2022-01-14 MED ORDER — LORAZEPAM 1 MG PO TABS
1.0000 mg | ORAL_TABLET | Freq: Four times a day (QID) | ORAL | Status: AC
  Administered 2022-01-14 – 2022-01-15 (×4): 1 mg via ORAL
  Filled 2022-01-14 (×4): qty 1

## 2022-01-14 MED ORDER — LORAZEPAM 1 MG PO TABS: 1.0000 mg | ORAL_TABLET | Freq: Two times a day (BID) | ORAL | Status: DC

## 2022-01-14 MED ORDER — ALUM & MAG HYDROXIDE-SIMETH 200-200-20 MG/5ML PO SUSP
30.0000 mL | ORAL | Status: DC | PRN
  Administered 2022-01-15: 30 mL via ORAL
  Filled 2022-01-14: qty 30

## 2022-01-14 MED ORDER — NAPROXEN 500 MG PO TABS
500.0000 mg | ORAL_TABLET | Freq: Two times a day (BID) | ORAL | Status: DC | PRN
  Administered 2022-01-14: 500 mg via ORAL
  Filled 2022-01-14: qty 1

## 2022-01-14 MED ORDER — LORAZEPAM 1 MG PO TABS
1.0000 mg | ORAL_TABLET | Freq: Four times a day (QID) | ORAL | Status: DC | PRN
  Administered 2022-01-14: 1 mg via ORAL
  Filled 2022-01-14: qty 1

## 2022-01-14 MED ORDER — LORAZEPAM 1 MG PO TABS: 1.0000 mg | ORAL_TABLET | Freq: Every day | ORAL | Status: DC

## 2022-01-14 MED ORDER — LORAZEPAM 1 MG PO TABS
1.0000 mg | ORAL_TABLET | Freq: Three times a day (TID) | ORAL | Status: DC
  Administered 2022-01-15 (×2): 1 mg via ORAL
  Filled 2022-01-14 (×2): qty 1

## 2022-01-14 MED ORDER — ACETAMINOPHEN 325 MG PO TABS
650.0000 mg | ORAL_TABLET | Freq: Four times a day (QID) | ORAL | Status: DC | PRN
  Administered 2022-01-15: 650 mg via ORAL
  Filled 2022-01-14: qty 2

## 2022-01-14 MED ORDER — THIAMINE MONONITRATE 100 MG PO TABS
100.0000 mg | ORAL_TABLET | Freq: Every day | ORAL | Status: DC
  Administered 2022-01-15: 100 mg via ORAL
  Filled 2022-01-14: qty 1

## 2022-01-14 MED ORDER — METHOCARBAMOL 500 MG PO TABS
500.0000 mg | ORAL_TABLET | Freq: Three times a day (TID) | ORAL | Status: DC | PRN
  Administered 2022-01-14 – 2022-01-15 (×3): 500 mg via ORAL
  Filled 2022-01-14 (×3): qty 1

## 2022-01-14 MED ORDER — MELATONIN 5 MG PO TABS
5.0000 mg | ORAL_TABLET | Freq: Every day | ORAL | Status: DC | PRN
  Administered 2022-01-14: 5 mg via ORAL
  Filled 2022-01-14 (×2): qty 1

## 2022-01-14 MED ORDER — ONDANSETRON 4 MG PO TBDP
4.0000 mg | ORAL_TABLET | Freq: Four times a day (QID) | ORAL | Status: DC | PRN
  Administered 2022-01-14 – 2022-01-15 (×3): 4 mg via ORAL
  Filled 2022-01-14 (×3): qty 1

## 2022-01-14 MED ORDER — ADULT MULTIVITAMIN W/MINERALS CH
1.0000 | ORAL_TABLET | Freq: Every day | ORAL | Status: DC
  Administered 2022-01-15: 1 via ORAL
  Filled 2022-01-14 (×2): qty 1

## 2022-01-14 MED ORDER — BUPROPION HCL ER (XL) 150 MG PO TB24
150.0000 mg | ORAL_TABLET | Freq: Every day | ORAL | Status: DC
  Administered 2022-01-15: 150 mg via ORAL
  Filled 2022-01-14 (×2): qty 1

## 2022-01-14 MED ORDER — MAGNESIUM HYDROXIDE 400 MG/5ML PO SUSP: 30.0000 mL | Freq: Every day | ORAL | Status: DC | PRN

## 2022-01-14 MED ORDER — ACETAMINOPHEN 500 MG PO TABS
1000.0000 mg | ORAL_TABLET | Freq: Once | ORAL | Status: AC
  Administered 2022-01-14: 1000 mg via ORAL
  Filled 2022-01-14: qty 2

## 2022-01-14 MED ORDER — HYDROXYZINE HCL 25 MG PO TABS
25.0000 mg | ORAL_TABLET | Freq: Four times a day (QID) | ORAL | Status: DC | PRN
  Administered 2022-01-14 – 2022-01-15 (×4): 25 mg via ORAL
  Filled 2022-01-14 (×4): qty 1

## 2022-01-14 MED ORDER — LOPERAMIDE HCL 2 MG PO CAPS: 2.0000 mg | ORAL_CAPSULE | ORAL | Status: DC | PRN

## 2022-01-14 MED ORDER — DICYCLOMINE HCL 20 MG PO TABS
20.0000 mg | ORAL_TABLET | Freq: Four times a day (QID) | ORAL | Status: DC | PRN
  Administered 2022-01-14 – 2022-01-15 (×2): 20 mg via ORAL
  Filled 2022-01-14 (×2): qty 1

## 2022-01-14 MED ORDER — LORAZEPAM 1 MG PO TABS
1.0000 mg | ORAL_TABLET | Freq: Four times a day (QID) | ORAL | Status: DC | PRN
  Administered 2022-01-15: 1 mg via ORAL
  Filled 2022-01-14: qty 1

## 2022-01-14 NOTE — ED Notes (Signed)
Travis is currently awake and requesting something for anxiety related to withdraw. She was medicated for nausea at 20:50 and for sleep at 22:54 and is now requesting something for anxiety will medicate with Atarax

## 2022-01-14 NOTE — ED Notes (Signed)
Left HIPPA compliant message on her best friend's phone number - EMS present - [facility] going with patient to ED

## 2022-01-14 NOTE — Progress Notes (Signed)
Heidi Pearson requested medications for her physical discomfort and anxiety. She was medicated per order.She ate lunch and returned to her chair bed.

## 2022-01-14 NOTE — Progress Notes (Signed)
Received Heidi Pearson this AM asleep in her chair bed. She woke up on her own, ate and later talked with the provider. She is aware the staff is looking for an inpatient bed  for her. She continues to have difficulty adjusting to the open environment. She refused her medication this and a note was put in the comment area. She endorsed feeling anxious and depressed, but denied feeling suiciudal.

## 2022-01-14 NOTE — ED Provider Notes (Signed)
Behavioral Health Progress Note  Date and Time: 01/14/2022 11:23 AM Name: Heidi Pearson MRN:  [id number]  Subjective:  Heidi Pearson 46 y.o., female patient presented to [facility] and was admitted to the continuous assessment unit while awaiting inpatient psychiatric bed availability.  She was placed under involuntary commitment while on the unit.  Per IVC.  Petitioned by Doran Heater, NP (mental health provider) "patient reports suicidal ideation two days ago, thoughts of "getting heroin from across the street and overdosing".  She has been diagnosed with mental illness and is not currently compliant with medications.  She has a history of previous suicide attempts.  Her primary emotional support individual states "I do not think she is safe, I think she will hurt herself if she leaves 100%".  Recent stressors include the upcoming anniversary of the death of patient's son who overdosed on heroin 6 years ago".    Heidi Pearson, 46 y.o., female patient seen face to face by this provider, consulted with Dr. Gasper Sells; and chart reviewed on 01/14/22.  Per chart review patient has a history of anxiety, panic attacks, PTSD, MDD, ADHD, and alcohol dependence.  While on the unit patient had an unwitnessed fall and hit her head.  She has a small bruised area on the right side of her forehead.  She was sent to the [facility] ED for medical clearance and a CT of the head was performed which was within normal limits.  She was transferred back to [facility] while awaiting inpatient psychiatric bed availability.  Reports she is feeling better today.   On today's reassessment Heidi Pearson is sitting up in her bed eating breakfast in no acute distress.  She is alert/oriented x4, cooperative, and attentive.  She is speaking in a clear tone and moderate pace.  She continues to endorse depression and appears anxious.  She is tearful at times. She is asking if she has been prescribed an Ativan or Klonopin taper.  States  the previous provider had told her she would schedule these medications. Of note: she was prescribed the Ativan taper but when she was readmitted after her fall, the taper was not added. Will start Ativan taper due to her benzodiazapine use. Reports before she presented to the [facility] she was taking Klonopin, Ativan, and Suboxone. UDS on admission was positive for benzodiazapine, buprenorphine and THC. Per PDMR patient was prescribed 10 days of Ativan on 12/28/2021 and her last filled prescription of Suboxone was on 11/12/2021.  She had used Suboxone that belonged to a friend recently.  She also received a bottle with 140 Ativan pills from her boyfriend.  Reports she has been taking multiple pills of Ativan throughout the day, one day she reports she took 20 pills.  She she endorses extreme anxiety as her only withdrawal symptoms at this time.  She attributes her increased depression to losing her father on November 11, 2021 and the upcoming anniversary of the death of her son due to a heroin overdose..  Reports it is difficult to sleep on the unit, she slept 4-6 hours.  She denies any concerns with appetite.  She denies SI/HI/AVH.  She contracts for safety.  She does not appear psychotic/manic.  She denies delusional and paranoid thought content.  Objectively she does not appear to be responding to internal/external stimuli.  We will continue to recommend inpatient psychiatric admission.  Patient has a history of suicide attempts, a recent suicide plan, she is impulsive, and has multiple  risk factors that make her at a high risk for suicide.   Diagnosis:  Final diagnoses:  Uncomplicated alcohol dependence (HCC)  Severe episode of recurrent major depressive disorder, without psychotic features (HCC)    Total Time spent with patient: 30 minutes  Past Psychiatric History: see h&P Past Medical History:  Past Medical History:  Diagnosis Date   ADD (attention deficit disorder)    Anxiety    Anxiety     Chronic knee pain    Depression    PTSD (post-traumatic stress disorder)     Past Surgical History:  Procedure Laterality Date   CESAREAN SECTION     Family History:  Family History  Problem Relation Age of Onset   Hypertension Mother    Cancer Mother    Hypertension Father    Family Psychiatric  History: see h&P Social History:  Social History   Substance and Sexual Activity  Alcohol Use Yes   Comment: socially      Social History   Substance and Sexual Activity  Drug Use No   Types: Oxycodone, Hydrocodone   Comment: Quit 04/11/2012 herion two years ago    Social History   Socioeconomic History   Marital status: Divorced    Spouse name: Not on file   Number of children: Not on file   Years of education: Not on file   Highest education level: Not on file  Occupational History   Not on file  Tobacco Use   Smoking status: Never   Smokeless tobacco: Never  Vaping Use   Vaping Use: Never used  Substance and Sexual Activity   Alcohol use: Yes    Comment: socially    Drug use: No    Types: Oxycodone, Hydrocodone    Comment: Quit 04/11/2012 herion two years ago   Sexual activity: Not Currently    Birth control/protection: None  Other Topics Concern   Not on file  Social History Narrative   Not on file   Social Determinants of Health   Financial Resource Strain: Not on file  Food Insecurity: Not on file  Transportation Needs: Not on file  Physical Activity: Not on file  Stress: Not on file  Social Connections: Not on file   SDOH:  SDOH Screenings   Depression (PHQ2-9): High Risk (01/13/2022)  Tobacco Use: Low Risk  (01/14/2022)   Additional Social History:                         Sleep: Poor  Appetite:  Fair  Current Medications:  Current Facility-Administered Medications  Medication Dose Route Frequency Provider Last Rate Last Admin   acetaminophen (TYLENOL) tablet 650 mg  650 mg Oral Q6H PRN Ajibola, Ene A, NP       alum & mag  hydroxide-simeth (MAALOX/MYLANTA) 200-200-20 MG/5ML suspension 30 mL  30 mL Oral Q4H PRN Ajibola, Ene A, NP       buPROPion (WELLBUTRIN XL) 24 hr tablet 150 mg  150 mg Oral Daily Ajibola, Ene A, NP       dicyclomine (BENTYL) tablet 20 mg  20 mg Oral Q6H PRN Ajibola, Ene A, NP       hydrOXYzine (ATARAX) tablet 25 mg  25 mg Oral Q6H PRN Ajibola, Ene A, NP   25 mg at 01/14/22 0833   loperamide (IMODIUM) capsule 2-4 mg  2-4 mg Oral PRN Ajibola, Ene A, NP       LORazepam (ATIVAN) tablet 1 mg  1 mg Oral  Q6H PRN Ajibola, Ene A, NP   1 mg at 01/14/22 0351   magnesium hydroxide (MILK OF MAGNESIA) suspension 30 mL  30 mL Oral Daily PRN Ajibola, Ene A, NP       methocarbamol (ROBAXIN) tablet 500 mg  500 mg Oral Q8H PRN Ajibola, Ene A, NP   500 mg at 01/14/22 0620   multivitamin with minerals tablet 1 tablet  1 tablet Oral Daily Ajibola, Ene A, NP       naproxen (NAPROSYN) tablet 500 mg  500 mg Oral BID PRN Ajibola, Ene A, NP       ondansetron (ZOFRAN-ODT) disintegrating tablet 4 mg  4 mg Oral Q6H PRN Ajibola, Ene A, NP       Current Outpatient Medications  Medication Sig Dispense Refill   acetaminophen (TYLENOL) 500 MG tablet Take 500 mg by mouth every 6 (six) hours as needed for mild pain, moderate pain, fever or headache.     amphetamine-dextroamphetamine (ADDERALL) 20 MG tablet Take 20 mg by mouth 2 (two) times daily.     aspirin-acetaminophen-caffeine (EXCEDRIN MIGRAINE) 250-250-65 MG tablet Take 1 tablet by mouth every 6 (six) hours as needed for headache or migraine.     buprenorphine-naloxone (SUBOXONE) 8-2 mg SUBL SL tablet Place 1 tablet under the tongue 3 (three) times daily.     buPROPion (WELLBUTRIN XL) 150 MG 24 hr tablet Take 150 mg by mouth daily.     clonazePAM (KLONOPIN) 1 MG tablet Take 1 mg by mouth every 12 (twelve) hours as needed for anxiety.     diphenhydrAMINE (BENADRYL) 25 MG tablet Take 25 mg by mouth every 6 (six) hours as needed for allergies.     gabapentin (NEURONTIN) 100 MG  capsule Take 100-200 mg by mouth at bedtime.     ibuprofen (ADVIL) 200 MG tablet Take 800 mg by mouth every 6 (six) hours as needed for mild pain.     VRAYLAR 1.5 MG capsule Take 1.5 mg by mouth daily.      Labs  Lab Results:  Admission on 01/14/2022, Discharged on 01/14/2022  Component Date Value Ref Range Status   Glucose-Capillary 01/14/2022 117 (H)  70 - 99 mg/dL Final   Glucose reference range applies only to samples taken after fasting for at least 8 hours.   Comment 1 01/14/2022 Notify RN   Final   Comment 2 01/14/2022 Document in Chart   Final   WBC 01/14/2022 6.2  4.0 - 10.5 K/uL Final   RBC 01/14/2022 4.14  3.87 - 5.11 MIL/uL Final   Hemoglobin 01/14/2022 12.0  12.0 - 15.0 g/dL Final   HCT 16/02/9603 38.1  36.0 - 46.0 % Final   MCV 01/14/2022 92.0  80.0 - 100.0 fL Final   MCH 01/14/2022 29.0  26.0 - 34.0 pg Final   MCHC 01/14/2022 31.5  30.0 - 36.0 g/dL Final   RDW 54/01/8118 12.1  11.5 - 15.5 % Final   Platelets 01/14/2022 332  150 - 400 K/uL Final   nRBC 01/14/2022 0.0  0.0 - 0.2 % Final   Performed at [facility], [address], [place], [place] [address]   Sodium 01/14/2022 138  135 - 145 mmol/L Final   Potassium 01/14/2022 3.8  3.5 - 5.1 mmol/L Final   Chloride 01/14/2022 104  98 - 111 mmol/L Final   CO2 01/14/2022 25  22 - 32 mmol/L Final   Glucose, Bld 01/14/2022 115 (H)  70 - 99 mg/dL Final   Glucose reference  range applies only to samples taken after fasting for at least 8 hours.   BUN 01/14/2022 11  6 - 20 mg/dL Final   Creatinine, Ser 01/14/2022 0.81  0.44 - 1.00 mg/dL Final   Calcium 40/98/1191 9.0  8.9 - 10.3 mg/dL Final   Total Protein 47/82/9562 6.7  6.5 - 8.1 g/dL Final   Albumin 13/12/6576 3.6  3.5 - 5.0 g/dL Final   AST 46/96/2952 28  15 - 41 U/L Final   ALT 01/14/2022 34  0 - 44 U/L Final   Alkaline Phosphatase 01/14/2022 86  38 - 126 U/L Final   Total Bilirubin 01/14/2022 0.4  0.3 - 1.2 mg/dL Final   GFR, Estimated 01/14/2022 >60  >60  mL/min Final   Comment: (NOTE) Calculated using the CKD-EPI Creatinine Equation (2021)    Anion gap 01/14/2022 9  5 - 15 Final   Performed at [facility], [address], [place], [place] [address]  Admission on 01/13/2022, Discharged on 01/14/2022  Component Date Value Ref Range Status   SARS Coronavirus 2 by RT PCR 01/13/2022 NEGATIVE  NEGATIVE Final   Comment: (NOTE) SARS-CoV-2 target nucleic acids are NOT DETECTED.  The SARS-CoV-2 RNA is generally detectable in upper respiratory specimens during the acute phase of infection. The lowest concentration of SARS-CoV-2 viral copies this assay can detect is 138 copies/mL. A negative result does not preclude SARS-Cov-2 infection and should not be used as the sole basis for treatment or other patient management decisions. A negative result may occur with  improper specimen collection/handling, submission of specimen other than nasopharyngeal swab, presence of viral mutation(s) within the areas targeted by this assay, and inadequate number of viral copies(<138 copies/mL). A negative result must be combined with clinical observations, patient history, and epidemiological information. The expected result is Negative.  Fact Sheet for Patients:  BloggerCourse.com  Fact Sheet for Healthcare Providers:  SeriousBroker.it  This test is no                          t yet approved or cleared by the [place] FDA and  has been authorized for detection and/or diagnosis of SARS-CoV-2 by FDA under an Emergency Use Authorization (EUA). This EUA will remain  in effect (meaning this test can be used) for the duration of the COVID-19 declaration under Section 564(b)(1) of the Act, 21 U.S.C.section 360bbb-3(b)(1), unless the authorization is terminated  or revoked sooner.       Influenza A by PCR 01/13/2022 NEGATIVE  NEGATIVE Final   Influenza B by PCR 01/13/2022 NEGATIVE  NEGATIVE Final    Comment: (NOTE) The Xpert Xpress SARS-CoV-2/FLU/RSV plus assay is intended as an aid in the diagnosis of influenza from Nasopharyngeal swab specimens and should not be used as a sole basis for treatment. Nasal washings and aspirates are unacceptable for Xpert Xpress SARS-CoV-2/FLU/RSV testing.  Fact Sheet for Patients: BloggerCourse.com  Fact Sheet for Healthcare Providers: SeriousBroker.it  This test is not yet approved or cleared by the [place] FDA and has been authorized for detection and/or diagnosis of SARS-CoV-2 by FDA under an Emergency Use Authorization (EUA). This EUA will remain in effect (meaning this test can be used) for the duration of the COVID-19 declaration under Section 564(b)(1) of the Act, 21 U.S.C. section 360bbb-3(b)(1), unless the authorization is terminated or revoked.  Performed at [facility], [address] [place], [place] [address]    WBC 01/13/2022 7.2  4.0 -  10.5 K/uL Final   RBC 01/13/2022 4.14  3.87 - 5.11 MIL/uL Final   Hemoglobin 01/13/2022 12.3  12.0 - 15.0 g/dL Final   HCT 16/10/960409/05/2021 36.4  36.0 - 46.0 % Final   MCV 01/13/2022 87.9  80.0 - 100.0 fL Final   MCH 01/13/2022 29.7  26.0 - 34.0 pg Final   MCHC 01/13/2022 33.8  30.0 - 36.0 g/dL Final   RDW 54/09/811909/05/2021 12.1  11.5 - 15.5 % Final   Platelets 01/13/2022 350  150 - 400 K/uL Final   nRBC 01/13/2022 0.0  0.0 - 0.2 % Final   Neutrophils Relative % 01/13/2022 65  % Final   Neutro Abs 01/13/2022 4.6  1.7 - 7.7 K/uL Final   Lymphocytes Relative 01/13/2022 26  % Final   Lymphs Abs 01/13/2022 1.8  0.7 - 4.0 K/uL Final   Monocytes Relative 01/13/2022 7  % Final   Monocytes Absolute 01/13/2022 0.5  0.1 - 1.0 K/uL Final   Eosinophils Relative 01/13/2022 2  % Final   Eosinophils Absolute 01/13/2022 0.1  0.0 - 0.5 K/uL Final   Basophils Relative 01/13/2022 0  % Final   Basophils Absolute 01/13/2022 0.0  0.0 - 0.1 K/uL Final   Immature  Granulocytes 01/13/2022 0  % Final   Abs Immature Granulocytes 01/13/2022 0.02  0.00 - 0.07 K/uL Final   Performed at [facility], [address] [place], [place] [address]   Sodium 01/13/2022 141  135 - 145 mmol/L Final   Potassium 01/13/2022 3.9  3.5 - 5.1 mmol/L Final   Chloride 01/13/2022 102  98 - 111 mmol/L Final   CO2 01/13/2022 27  22 - 32 mmol/L Final   Glucose, Bld 01/13/2022 103 (H)  70 - 99 mg/dL Final   Glucose reference range applies only to samples taken after fasting for at least 8 hours.   BUN 01/13/2022 10  6 - 20 mg/dL Final   Creatinine, Ser 01/13/2022 0.78  0.44 - 1.00 mg/dL Final   Calcium 95/62/130809/05/2021 9.6  8.9 - 10.3 mg/dL Final   Total Protein 65/78/469609/05/2021 6.5  6.5 - 8.1 g/dL Final   Albumin 29/52/841309/05/2021 3.6  3.5 - 5.0 g/dL Final   AST 24/40/102709/05/2021 30  15 - 41 U/L Final   ALT 01/13/2022 35  0 - 44 U/L Final   Alkaline Phosphatase 01/13/2022 82  38 - 126 U/L Final   Total Bilirubin 01/13/2022 0.5  0.3 - 1.2 mg/dL Final   GFR, Estimated 01/13/2022 >60  >60 mL/min Final   Comment: (NOTE) Calculated using the CKD-EPI Creatinine Equation (2021)    Anion gap 01/13/2022 12  5 - 15 Final   Performed at [facility], [address] [place], [place] [address]   Hgb A1c MFr Bld 01/13/2022 5.4  4.8 - 5.6 % Final   Comment: (NOTE) Pre diabetes:          5.7%-6.4%  Diabetes:              >6.4%  Glycemic control for   <7.0% adults with diabetes    Mean Plasma Glucose 01/13/2022 108.28  mg/dL Final   Performed at [facility], [address] [place], [place] [address]   Magnesium 01/13/2022 1.9  1.7 - 2.4 mg/dL Final   Performed at [facility], [address] [place], [place] [address]   Alcohol, Ethyl (B) 01/13/2022 <10  <10 mg/dL Final   Comment: (NOTE) Lowest detectable limit for serum alcohol  is 10 mg/dL.  For medical purposes only. Performed at [facility], [address], [place], [place] [address]    Cholesterol 01/13/2022 215  (H)  0 - 200 mg/dL Final   Triglycerides 84/13/2440 208 (H)  <150 mg/dL Final   HDL 03/11/2535 44  >40 mg/dL Final   Total CHOL/HDL Ratio 01/13/2022 4.9  RATIO Final   VLDL 01/13/2022 42 (H)  0 - 40 mg/dL Final   LDL Cholesterol 01/13/2022 129 (H)  0 - 99 mg/dL Final   Comment:        Total Cholesterol/HDL:CHD Risk Coronary Heart Disease Risk Table                     Men   Women  1/2 Average Risk   3.4   3.3  Average Risk       5.0   4.4  2 X Average Risk   9.6   7.1  3 X Average Risk  23.4   11.0        Use the calculated Patient Ratio above and the CHD Risk Table to determine the patient's CHD Risk.        ATP III CLASSIFICATION (LDL):  <100     mg/dL   Optimal  644-034  mg/dL   Near or Above                    Optimal  130-159  mg/dL   Borderline  742-595  mg/dL   High  >638     mg/dL   Very High Performed at [facility], [address], [place], [place] [address]    TSH 01/13/2022 1.083  0.350 - 4.500 uIU/mL Final   Comment: Performed by a 3rd Generation assay with a functional sensitivity of <=0.01 uIU/mL. Performed at [facility], [address], [place], [place] [address]    Prolactin 01/13/2022 22.8  4.8 - 23.3 ng/mL Final   Comment: (NOTE) Performed At: [facility] [address] [place], [place] [id number] Jolene Schimke MD KZ:[id number]    POC Amphetamine UR 01/13/2022 None Detected  NONE DETECTED (Cut Off Level 1000 ng/mL) Final   POC Secobarbital (BAR) 01/13/2022 None Detected  NONE DETECTED (Cut Off Level 300 ng/mL) Final   POC Buprenorphine (BUP) 01/13/2022 Positive (A)  NONE DETECTED (Cut Off Level 10 ng/mL) Final   POC Oxazepam (BZO) 01/13/2022 Positive (A)  NONE DETECTED (Cut Off Level 300 ng/mL) Final   POC Cocaine UR 01/13/2022 None Detected  NONE DETECTED (Cut Off Level 300 ng/mL) Final   POC Methamphetamine UR 01/13/2022 None Detected  NONE DETECTED (Cut Off Level 1000 ng/mL) Final   POC Morphine 01/13/2022 None Detected  NONE  DETECTED (Cut Off Level 300 ng/mL) Final   POC Methadone UR 01/13/2022 None Detected  NONE DETECTED (Cut Off Level 300 ng/mL) Final   POC Oxycodone UR 01/13/2022 None Detected  NONE DETECTED (Cut Off Level 100 ng/mL) Final   POC Marijuana UR 01/13/2022 Positive (A)  NONE DETECTED (Cut Off Level 50 ng/mL) Final   SARSCOV2ONAVIRUS 2 AG 01/13/2022 NEGATIVE  NEGATIVE Final   Comment: (NOTE) SARS-CoV-2 antigen NOT DETECTED.   Negative results are presumptive.  Negative results do not preclude SARS-CoV-2 infection and should not be used as the sole basis for treatment or other patient management decisions, including infection  control decisions, particularly in the presence of clinical signs and  symptoms consistent with COVID-19, or in those who  have been in contact with the virus.  Negative results must be combined with clinical observations, patient history, and epidemiological information. The expected result is Negative.  Fact Sheet for Patients: https://www.jennings-kim.com/  Fact Sheet for Healthcare Providers: https://alexander-rogers.biz/  This test is not yet approved or cleared by the [place] FDA and  has been authorized for detection and/or diagnosis of SARS-CoV-2 by FDA under an Emergency Use Authorization (EUA).  This EUA will remain in effect (meaning this test can be used) for the duration of  the COV                          ID-19 declaration under Section 564(b)(1) of the Act, 21 U.S.C. section 360bbb-3(b)(1), unless the authorization is terminated or revoked sooner.     Preg Test, Ur 01/13/2022 NEGATIVE  NEGATIVE Final   Comment:        THE SENSITIVITY OF THIS METHODOLOGY IS >24 mIU/mL     Blood Alcohol level:  Lab Results  Component Value Date   ETH <10 01/13/2022   ETH 96 (H) 12/11/2013    Metabolic Disorder Labs: Lab Results  Component Value Date   HGBA1C 5.4 01/13/2022   MPG 108.28 01/13/2022   Lab Results  Component  Value Date   PROLACTIN 22.8 01/13/2022   Lab Results  Component Value Date   CHOL 215 (H) 01/13/2022   TRIG 208 (H) 01/13/2022   HDL 44 01/13/2022   CHOLHDL 4.9 01/13/2022   VLDL 42 (H) 01/13/2022   LDLCALC 129 (H) 01/13/2022    Therapeutic Lab Levels: No results found for: "LITHIUM" No results found for: "VALPROATE" No results found for: "CBMZ"  Physical Findings   AUDIT    Flowsheet Row [facility]) [facility] (Discharged) [facility]Discharged) [facility]) [facility]  Alcohol Use Disorder Identification Test Final Score (AUDIT) 30 1 0 0      PHQ2-9    Flowsheet Row ED from 01/13/2022 in [facility]  PHQ-2 Total Score 4  PHQ-9 Total Score 18      Flowsheet Row ED from 01/14/2022 in [facility] from [facility] from 01/11/2022 [facility]  C-SSRS RISK CATEGORY High Risk High Risk High Risk        Musculoskeletal  Strength & Muscle Tone: within normal limits Gait & Station: normal Patient leans: N/A  Psychiatric Specialty Exam  Presentation  General Appearance: Casual  Eye Contact:Good  Speech:Clear and Coherent; Normal Rate  Speech Volume:Normal  Handedness:Right   Mood and Affect  Mood:Anxious; Depressed  Affect:Congruent   Thought Process  Thought Processes:Coherent  Descriptions of Associations:Intact  Orientation:Full (Time, Place and Person)  Thought Content:Logical  Diagnosis of Schizophrenia or Schizoaffective disorder in past: No    Hallucinations:Hallucinations: None  Ideas of Reference:None  Suicidal Thoughts:Suicidal  Thoughts: No  Homicidal Thoughts:Homicidal Thoughts: No   Sensorium  Memory:Immediate Good; Recent Good; Remote Good  Judgment:Fair  Insight:Fair   Executive Functions  Concentration:Good  Attention Span:Good  Recall:Good  Fund of Knowledge:Good  Language:Good   Psychomotor Activity  Psychomotor Activity:Psychomotor Activity: Normal   Assets  Assets:Communication Skills; Desire for Improvement; Financial Resources/Insurance; Social Support; Resilience   Sleep  Sleep:Sleep: Poor Number of Hours of Sleep: 4   Nutritional Assessment (For OBS and [facility] admissions only) Has the patient had a weight loss or gain of 10 pounds or more in the last 3 months?: No Has the patient had a decrease in food intake/or appetite?: No Does the patient have dental problems?: No Does the patient have eating habits or behaviors that may be indicators of an eating disorder including binging or inducing vomiting?: No Has the patient recently lost weight without trying?: 0 Has the patient been eating poorly because of a decreased appetite?: 0 Malnutrition Screening Tool Score: 0    Physical Exam  Physical Exam Vitals and nursing note reviewed.  Constitutional:      General: She is not in acute distress.    Appearance: Normal appearance. She is not ill-appearing.  HENT:     Head: Normocephalic.  Eyes:     General:        Right eye: No discharge.        Left eye: No discharge.     Conjunctiva/sclera: Conjunctivae normal.     Pupils: Pupils are equal, round, and reactive to light.  Cardiovascular:     Rate and Rhythm: Normal rate.  Pulmonary:     Effort: Pulmonary effort is normal.  Musculoskeletal:        General: Normal range of motion.     Cervical back: Normal range of motion.  Skin:    General: Skin is warm and dry.     Coloration: Skin is not jaundiced or pale.  Neurological:     Mental Status: She is alert and oriented to person, place, and time.  Psychiatric:         Attention and Perception: Attention and perception normal.        Mood and Affect: Mood is anxious and depressed. Affect is tearful.        Speech: Speech normal.        Thought Content: Thought content normal.        Cognition and Memory: Cognition normal.        Judgment: Judgment normal.    Review of Systems  Constitutional: Negative.   HENT: Negative.    Eyes: Negative.   Respiratory: Negative.    Cardiovascular: Negative.   Gastrointestinal: Negative.   Musculoskeletal: Negative.   Skin: Negative.   Neurological: Negative.   Psychiatric/Behavioral:  Positive for depression. The patient is nervous/anxious.    Blood pressure 115/71, pulse 70, temperature 97.8 F (36.6 C), temperature source Oral, resp. rate 17, last menstrual period 01/07/2022, SpO2 98 %. There is no height or weight on file to calculate BMI.  Treatment Plan Summary: Disposition: Patient continues to meet IP psychiatric admission criteria. [facility] notified and there is no bed availability. SW notified and patient has been faxed out. Will continue to have daily contact with patient to assess and evaluate symptoms and progress in treatment and Medication management   Ativan taper ordered for benzodiazapine abuse.    Ardis Hughs, NP 01/14/2022 11:23 AM

## 2022-01-14 NOTE — ED Notes (Deleted)
Safe transport called 

## 2022-01-14 NOTE — ED Provider Notes (Addendum)
[facility] Continuous Assessment Admission H&P  Date: 01/14/22 Patient Name: Heidi Pearson MRN: [id number] Chief Complaint: No chief complaint on file.     Diagnoses:  Final diagnoses:  Uncomplicated alcohol dependence ([place])  Severe episode of recurrent major depressive disorder, without psychotic features ([place])    HPI: Heidi Pearson is a 46 year old female with psychiatric history significant for polysubstance abuse, ADHD, depression, PTSD, and anxiety. [facility] from [facility] post medical clearance after sustaining an unwitnessed fall while on the unit here at [facility].   Briefly, patient patient initially presented to [facility] on 01/13/2022 with concern for worsening depressive symptoms and suicidal ideation.  Patient was recommended for inpatient psychiatric admission.  Patient was placed on the IVC petition.  She sustained an unwitnessed fall with a hematoma to the right forehead while on the unit at [facility] and was sent to [facility] for medical clearance.  CT head and cervical spine were unremarkable.  This nurse practitioner reviewed patient's chart and met with her face-to-face upon her return to [facility]. On evaluation, patient is alert and oriented x 4, she is calm and cooperative. She continues to deny SOB, CP, dizziness, visual disturbances, numbness, tingling, or weakness.  Blood pressure 115/71, pulse 70, temperature 97.8 F (36.6 C), temperature source Oral, resp. rate 17, last menstrual period 01/07/2022, SpO2 98 %.   Per Jon Gills on 01/13/22: Patient endorses recent stressors include the use of benzodiazepine medications as well as Suboxone that she "purchased from the street or from friends."  She reports after 8 years of sobriety she relapsed on alcohol and benzodiazepine medications approximately 1 month ago.  Last alcohol use on yesterday.  She typically consumes 1 bottle of wine or more per day.  She denies history of alcohol-related seizure.  She  endorses use of Ativan and Klonopin for approximately 1 month.  She has been prescribed clonazepam in the past.  She was last prescribed this medication several months ago as she lost her healthcare insurance several months ago.  Most recent benzodiazepine use, clonazepam, earlier this date.  She states she has been seen in emergency departments 3 times related to benzodiazepine withdrawal symptoms since 12/28/2021.     PHQ 2-9:  [place] [facility] from 01/13/2022 in [facility]  Thoughts that you would be better off dead, or of hurting yourself in some way More than half the days  PHQ-9 Total Score 18       [place] [facility] from 01/14/2022 in [place] [facility] from 01/13/2022 in [facility] ED from 01/11/2022 in [place] CATEGORY High Risk High Risk High Risk        Total Time spent with patient: 15 minutes  Musculoskeletal  Strength & Muscle Tone: within normal limits Gait & Station: normal Patient leans: Right  Psychiatric Specialty Exam  Presentation General Appearance: Appropriate for Environment  Eye Contact:Good  Speech:Clear and Coherent  Speech Volume:Normal  Handedness:Right   Mood and Affect  Mood:Euthymic  Affect:Congruent   Thought Process  Thought Processes:Coherent  Descriptions of Associations:Intact  Orientation:Full (Time, Place and Person)  Thought Content:WDL  Diagnosis of Schizophrenia or Schizoaffective disorder in past: No   Hallucinations:Hallucinations: None  Ideas of Reference:None  Suicidal Thoughts:Suicidal Thoughts: No  Homicidal Thoughts:Homicidal Thoughts: No   Sensorium  Memory:Immediate Good; Recent Good; Remote Good  Judgment:Fair  Insight:Fair   Executive Functions  Concentration:Fair  Attention  Span:Good  [place]  Language:Good   Psychomotor  Activity  Psychomotor Activity:Psychomotor Activity: Normal   Assets  Assets:Communication Skills; Desire for Improvement; Social Support; Housing; Physical Health   Sleep  Sleep:Sleep: Poor Number of Hours of Sleep: 4   Nutritional Assessment (For OBS and FBC admissions only) Has the patient had a weight loss or gain of 10 pounds or more in the last 3 months?: No Has the patient had a decrease in food intake/or appetite?: No Does the patient have dental problems?: No Does the patient have eating habits or behaviors that may be indicators of an eating disorder including binging or inducing vomiting?: No Has the patient recently lost weight without trying?: 0 Has the patient been eating poorly because of a decreased appetite?: 0 Malnutrition Screening Tool Score: 0    Physical Exam Vitals and nursing note reviewed.  Constitutional:      General: She is not in acute distress.    Appearance: She is well-developed. She is not ill-appearing.  HENT:     Head: Normocephalic and atraumatic.  Eyes:     Conjunctiva/sclera: Conjunctivae normal.  Cardiovascular:     Rate and Rhythm: Normal rate.  Pulmonary:     [place]: Pulmonary effort is normal.  Abdominal:     Palpations: Abdomen is soft.     Tenderness: There is no abdominal tenderness.  Musculoskeletal:        General: No swelling.     Cervical back: Neck supple.  Skin:    General: Skin is warm and dry.  Neurological:     Mental Status: She is alert and oriented to person, place, and time.  Psychiatric:        Attention and Perception: Attention and perception normal.        Mood and Affect: Mood is anxious and depressed.        Speech: Speech normal.        Behavior: Behavior normal. Behavior is cooperative.        Thought Content: Thought content is not paranoid or delusional. Thought content does not include homicidal or suicidal ideation. Thought content does not include homicidal or suicidal plan.    Review of  Systems  Constitutional: Negative.   HENT: Negative.    Eyes: Negative.   Respiratory: Negative.    Cardiovascular: Negative.   Gastrointestinal: Negative.   Genitourinary: Negative.   Musculoskeletal: Negative.   Skin: Negative.   Neurological: Negative.   Endo/Heme/Allergies: Negative.   Psychiatric/Behavioral:  Positive for depression and substance abuse. The patient is nervous/anxious.     Blood pressure 115/71, pulse 70, last menstrual period 01/07/2022. There is no height or weight on file to calculate BMI.  Past Psychiatric History:    Is the patient at risk to self? Yes  Has the patient been a risk to self in the past 6 months? No .    Has the patient been a risk to self within the distant past? Yes   Is the patient a risk to others? No   Has the patient been a risk to others in the past 6 months? No   Has the patient been a risk to others within the distant past? No   Past Medical History:  Past Medical History:  Diagnosis Date   ADD (attention deficit disorder)    Anxiety    Anxiety    Chronic knee pain    Depression    PTSD (post-traumatic stress disorder)     Past  Surgical History:  Procedure Laterality Date   CESAREAN SECTION      Family History:  Family History  Problem Relation Age of Onset   Hypertension Mother    Cancer Mother    Hypertension Father     Social History:  Social History   Socioeconomic History   Marital status: Divorced    Spouse name: Not on file   Number of children: Not on file   Years of education: Not on file   Highest education level: Not on file  Occupational History   Not on file  Tobacco Use   Smoking status: Never   Smokeless tobacco: Never  Vaping Use   Vaping Use: Never used  Substance and Sexual Activity   Alcohol use: Yes    Comment: socially    Drug use: No    Types: Oxycodone, Hydrocodone    Comment: Quit 04/11/2012 herion two years ago   Sexual activity: Not Currently    Birth control/protection:  None  Other Topics Concern   Not on file  Social History Narrative   Not on file   Social Determinants of Health   Financial Resource Strain: Not on file  Food Insecurity: Not on file  Transportation Needs: Not on file  Physical Activity: Not on file  Stress: Not on file  Social Connections: Not on file  Intimate Partner Violence: Not on file    SDOH:  SDOH Screenings   Depression (PHQ2-9): High Risk (01/13/2022)  Tobacco Use: Low Risk  (01/14/2022)    Last Labs:  Admission on 01/14/2022, Discharged on 01/14/2022  Component Date Value Ref Range Status   Glucose-Capillary 01/14/2022 117 (H)  70 - 99 mg/dL Final   Glucose reference range applies only to samples taken after fasting for at least 8 hours.   Comment 1 01/14/2022 Notify RN   Final   Comment 2 01/14/2022 Document in Chart   Final   WBC 01/14/2022 6.2  4.0 - 10.5 K/uL Final   RBC 01/14/2022 4.14  3.87 - 5.11 MIL/uL Final   Hemoglobin 01/14/2022 12.0  12.0 - 15.0 g/dL Final   HCT 01/14/2022 38.1  36.0 - 46.0 % Final   MCV 01/14/2022 92.0  80.0 - 100.0 fL Final   MCH 01/14/2022 29.0  26.0 - 34.0 pg Final   MCHC 01/14/2022 31.5  30.0 - 36.0 g/dL Final   RDW 01/14/2022 12.1  11.5 - 15.5 % Final   Platelets 01/14/2022 332  150 - 400 K/uL Final   nRBC 01/14/2022 0.0  0.0 - 0.2 % Final   Performed at [facility] [address], [place], [place] [address]   Sodium 01/14/2022 138  135 - 145 mmol/L Final   Potassium 01/14/2022 3.8  3.5 - 5.1 mmol/L Final   Chloride 01/14/2022 104  98 - 111 mmol/L Final   CO2 01/14/2022 25  22 - 32 mmol/L Final   Glucose, Bld 01/14/2022 115 (H)  70 - 99 mg/dL Final   Glucose reference range applies only to samples taken after fasting for at least 8 hours.   BUN 01/14/2022 11  6 - 20 mg/dL Final   Creatinine, Ser 01/14/2022 0.81  0.44 - 1.00 mg/dL Final   Calcium 01/14/2022 9.0  8.9 - 10.3 mg/dL Final   Total Protein 01/14/2022 6.7  6.5 - 8.1 g/dL Final   Albumin 01/14/2022 3.6   3.5 - 5.0 g/dL Final   AST 01/14/2022 28  15 - 41 U/L Final   ALT 01/14/2022  34  0 - 44 U/L Final   Alkaline Phosphatase 01/14/2022 86  38 - 126 U/L Final   Total Bilirubin 01/14/2022 0.4  0.3 - 1.2 mg/dL Final   GFR, Estimated 01/14/2022 >60  >60 mL/min Final   Comment: (NOTE) Calculated using the CKD-EPI Creatinine Equation (2021)    Anion gap 01/14/2022 9  5 - 15 Final   Performed at [facility], [place] [address], [place], [place] [address]  Admission on 01/13/2022, Discharged on 01/14/2022  Component Date Value Ref Range Status   SARS Coronavirus 2 by RT PCR 01/13/2022 NEGATIVE  NEGATIVE Final   Comment: (NOTE) SARS-CoV-2 target nucleic acids are NOT DETECTED.  The SARS-CoV-2 RNA is generally detectable in upper respiratory specimens during the acute phase of infection. The lowest concentration of SARS-CoV-2 viral copies this assay can detect is 138 copies/mL. A negative result does not preclude SARS-Cov-2 infection and should not be used as the sole basis for treatment or other patient management decisions. A negative result may occur with  improper specimen collection/handling, submission of specimen other than nasopharyngeal swab, presence of viral mutation(s) within the areas targeted by this assay, and inadequate number of viral copies(<138 copies/mL). A negative result must be combined with clinical observations, patient history, and epidemiological information. The expected result is Negative.  Fact Sheet for Patients:  EntrepreneurPulse.com.au  Fact Sheet for Healthcare Providers:  IncredibleEmployment.be  This test is no                          t yet approved or cleared by the [place] FDA and  has been authorized for detection and/or diagnosis of SARS-CoV-2 by FDA under an Emergency Use Authorization (EUA). This EUA will remain  in effect (meaning this test can be used) for the duration of the COVID-19 declaration  under Section 564(b)(1) of the Act, 21 U.S.C.section 360bbb-3(b)(1), unless the authorization is terminated  or revoked sooner.       Influenza A by PCR 01/13/2022 NEGATIVE  NEGATIVE Final   Influenza B by PCR 01/13/2022 NEGATIVE  NEGATIVE Final   Comment: (NOTE) The Xpert Xpress SARS-CoV-2/FLU/RSV plus assay is intended as an aid in the diagnosis of influenza from Nasopharyngeal swab specimens and should not be used as a sole basis for treatment. Nasal washings and aspirates are unacceptable for Xpert Xpress SARS-CoV-2/FLU/RSV testing.  Fact Sheet for Patients: EntrepreneurPulse.com.au  Fact Sheet for Healthcare Providers: IncredibleEmployment.be  This test is not yet approved or cleared by the [place] FDA and has been authorized for detection and/or diagnosis of SARS-CoV-2 by FDA under an Emergency Use Authorization (EUA). This EUA will remain in effect (meaning this test can be used) for the duration of the COVID-19 declaration under Section 564(b)(1) of the Act, 21 U.S.C. section 360bbb-3(b)(1), unless the authorization is terminated or revoked.  Performed at [facility], [place] [address], [place], [place] [address]    WBC 01/13/2022 7.2  4.0 - 10.5 K/uL Final   RBC 01/13/2022 4.14  3.87 - 5.11 MIL/uL Final   Hemoglobin 01/13/2022 12.3  12.0 - 15.0 g/dL Final   HCT 01/13/2022 36.4  36.0 - 46.0 % Final   MCV 01/13/2022 87.9  80.0 - 100.0 fL Final   MCH 01/13/2022 29.7  26.0 - 34.0 pg Final   MCHC 01/13/2022 33.8  30.0 - 36.0 g/dL Final   RDW 01/13/2022 12.1  11.5 - 15.5 % Final   Platelets 01/13/2022 350  150 - 400 K/uL Final   nRBC 01/13/2022 0.0  0.0 - 0.2 % Final   Neutrophils Relative % 01/13/2022 65  % Final   Neutro Abs 01/13/2022 4.6  1.7 - 7.7 K/uL Final   Lymphocytes Relative 01/13/2022 26  % Final   Lymphs Abs 01/13/2022 1.8  0.7 - 4.0 K/uL Final   Monocytes Relative 01/13/2022 7  % Final   Monocytes Absolute  01/13/2022 0.5  0.1 - 1.0 K/uL Final   Eosinophils Relative 01/13/2022 2  % Final   Eosinophils Absolute 01/13/2022 0.1  0.0 - 0.5 K/uL Final   Basophils Relative 01/13/2022 0  % Final   Basophils Absolute 01/13/2022 0.0  0.0 - 0.1 K/uL Final   Immature Granulocytes 01/13/2022 0  % Final   Abs Immature Granulocytes 01/13/2022 0.02  0.00 - 0.07 K/uL Final   Performed at [facility], [place] [address] [place], [place] [address]   Sodium 01/13/2022 141  135 - 145 mmol/L Final   Potassium 01/13/2022 3.9  3.5 - 5.1 mmol/L Final   Chloride 01/13/2022 102  98 - 111 mmol/L Final   CO2 01/13/2022 27  22 - 32 mmol/L Final   Glucose, Bld 01/13/2022 103 (H)  70 - 99 mg/dL Final   Glucose reference range applies only to samples taken after fasting for at least 8 hours.   BUN 01/13/2022 10  6 - 20 mg/dL Final   Creatinine, Ser 01/13/2022 0.78  0.44 - 1.00 mg/dL Final   Calcium 01/13/2022 9.6  8.9 - 10.3 mg/dL Final   Total Protein 01/13/2022 6.5  6.5 - 8.1 g/dL Final   Albumin 01/13/2022 3.6  3.5 - 5.0 g/dL Final   AST 01/13/2022 30  15 - 41 U/L Final   ALT 01/13/2022 35  0 - 44 U/L Final   Alkaline Phosphatase 01/13/2022 82  38 - 126 U/L Final   Total Bilirubin 01/13/2022 0.5  0.3 - 1.2 mg/dL Final   GFR, Estimated 01/13/2022 >60  >60 mL/min Final   Comment: (NOTE) Calculated using the CKD-EPI Creatinine Equation (2021)    Anion gap 01/13/2022 12  5 - 15 Final   Performed at [facility] [address] [place], [place] [address]   Hgb A1c MFr Bld 01/13/2022 5.4  4.8 - 5.6 % Final   Comment: (NOTE) Pre diabetes:          5.7%-6.4%  Diabetes:              >6.4%  Glycemic control for   <7.0% adults with diabetes    Mean Plasma Glucose 01/13/2022 108.28  mg/dL Final   Performed at [facility], [place] [address] [place], [place] [address]   Magnesium 01/13/2022 1.9  1.7 - 2.4 mg/dL Final   Performed at [facility] [address], [place], [place] [address]   Alcohol,  Ethyl (B) 01/13/2022 <10  <10 mg/dL Final   Comment: (NOTE) Lowest detectable limit for serum alcohol is 10 mg/dL.  For medical purposes only. Performed at [facility], [place] [address], [place],  [address]    Cholesterol 01/13/2022 215 (H)  0 - 200 mg/dL Final   Triglycerides 01/13/2022 208 (H)  <150 mg/dL Final   HDL 01/13/2022 44  >40 mg/dL Final   Total CHOL/HDL Ratio 01/13/2022 4.9  RATIO Final   VLDL 01/13/2022 42 (H)  0 - 40 mg/dL Final   LDL Cholesterol 01/13/2022 129 (H)  0 -  99 mg/dL Final   Comment:        Total Cholesterol/HDL:CHD Risk Coronary Heart Disease Risk Table                     Men   Women  1/2 Average Risk   3.4   3.3  Average Risk       5.0   4.4  2 X Average Risk   9.6   7.1  3 X Average Risk  23.4   11.0        Use the calculated Patient Ratio above and the CHD Risk Table to determine the patient's CHD Risk.        ATP III CLASSIFICATION (LDL):  <100     mg/dL   Optimal  100-129  mg/dL   Near or Above                    Optimal  130-159  mg/dL   Borderline  160-189  mg/dL   High  >190     mg/dL   Very High Performed at [facility] [address], [place], [place] [address]    TSH 01/13/2022 1.083  0.350 - 4.500 uIU/mL Final   Comment: Performed by a 3rd Generation assay with a functional sensitivity of <=0.01 uIU/mL. Performed at [facility], [place] [address], [place], [place] [address]    POC Amphetamine UR 01/13/2022 None Detected  NONE DETECTED (Cut Off Level 1000 ng/mL) Final   POC Secobarbital (BAR) 01/13/2022 None Detected  NONE DETECTED (Cut Off Level 300 ng/mL) Final   POC Buprenorphine (BUP) 01/13/2022 Positive (A)  NONE DETECTED (Cut Off Level 10 ng/mL) Final   POC Oxazepam (BZO) 01/13/2022 Positive (A)  NONE DETECTED (Cut Off Level 300 ng/mL) Final   POC Cocaine UR 01/13/2022 None Detected  NONE DETECTED (Cut Off Level 300 ng/mL) Final   POC Methamphetamine UR 01/13/2022 None Detected  NONE DETECTED (Cut Off  Level 1000 ng/mL) Final   POC Morphine 01/13/2022 None Detected  NONE DETECTED (Cut Off Level 300 ng/mL) Final   POC Methadone UR 01/13/2022 None Detected  NONE DETECTED (Cut Off Level 300 ng/mL) Final   POC Oxycodone UR 01/13/2022 None Detected  NONE DETECTED (Cut Off Level 100 ng/mL) Final   POC Marijuana UR 01/13/2022 Positive (A)  NONE DETECTED (Cut Off Level 50 ng/mL) Final   SARSCOV2ONAVIRUS 2 AG 01/13/2022 NEGATIVE  NEGATIVE Final   Comment: (NOTE) SARS-CoV-2 antigen NOT DETECTED.   Negative results are presumptive.  Negative results do not preclude SARS-CoV-2 infection and should not be used as the sole basis for treatment or other patient management decisions, including infection  control decisions, particularly in the presence of clinical signs and  symptoms consistent with COVID-19, or in those who have been in contact with the virus.  Negative results must be combined with clinical observations, patient history, and epidemiological information. The expected result is Negative.  Fact Sheet for Patients: HandmadeRecipes.com.cy  Fact Sheet for Healthcare Providers: FuneralLife.at  This test is not yet approved or cleared by the [place] FDA and  has been authorized for detection and/or diagnosis of SARS-CoV-2 by FDA under an Emergency Use Authorization (EUA).  This EUA will remain in effect (meaning this test can be used) for the duration of  the COV  ID-19 declaration under Section 564(b)(1) of the Act, 21 U.S.C. section 360bbb-3(b)(1), unless the authorization is terminated or revoked sooner.     Preg Test, Ur 01/13/2022 NEGATIVE  NEGATIVE Final   Comment:        THE SENSITIVITY OF THIS METHODOLOGY IS >24 mIU/mL     Allergies: Morphine and related and Paxil [paroxetine]  PTA Medications: (Not in a hospital admission)   Medical Decision Making  Patient continues to meet criteria for  inpatient psychiatric admission.  Patient remains under involuntary commitment. -Resume bupropion XL150 mg daily -Resume COWS protocol and CIWA protocol    Recommendations  Based on my evaluation the patient does not appear to have an emergency medical condition.  Ophelia Shoulder, NP 01/14/22  3:46 AM

## 2022-01-14 NOTE — ED Notes (Signed)
[facility] Called to transport pt back to [facility]

## 2022-01-14 NOTE — ED Notes (Signed)
Pt requesting zofran  fo nausea

## 2022-01-14 NOTE — Discharge Instructions (Signed)
You were evaluated in the Emergency Department and after careful evaluation, we did not find any emergent condition requiring admission or further testing in the hospital.  Your exam/testing today was overall reassuring.  Suspect your fainting spell is related to withdrawal and some dehydration.  Important to drink plenty of fluids and use caution when changing positions or standing from a seated position.  Use Tylenol or Motrin at home for any soreness.  Please return to the Emergency Department if you experience any worsening of your condition.  Thank you for allowing [place] to be a part of your care.

## 2022-01-14 NOTE — ED Provider Notes (Signed)
[facility] DEPT [facility] Emergency Department Provider Note MRN:  [id number]  Arrival date & time: 01/14/22     Chief Complaint   Fall   History of Present Illness   Heidi Pearson is a 46 y.o. year-old female with history of depression presenting to the ED with chief complaint of fall.  Patient transferred from behavioral health after a fall in the bathroom.  She bent over and then potentially passed out.  Endorsing head pain, hematoma to the head, neck pain, otherwise denies back pain, no chest pain or shortness of breath, no abdominal pain, no injuries to the arms or legs.  Explains that she stopped her Suboxone and Klonopin cold [place] recently.  Review of Systems  A thorough review of systems was obtained and all systems are negative except as noted in the HPI and PMH.   Patient's Health History    Past Medical History:  Diagnosis Date   ADD (attention deficit disorder)    Anxiety    Anxiety    Chronic knee pain    Depression    PTSD (post-traumatic stress disorder)     Past Surgical History:  Procedure Laterality Date   CESAREAN SECTION      Family History  Problem Relation Age of Onset   Hypertension Mother    Cancer Mother    Hypertension Father     Social History   Socioeconomic History   Marital status: Divorced    Spouse name: Not on file   Number of children: Not on file   Years of education: Not on file   Highest education level: Not on file  Occupational History   Not on file  Tobacco Use   Smoking status: Never   Smokeless tobacco: Never  Vaping Use   Vaping Use: Never used  Substance and Sexual Activity   Alcohol use: Yes    Comment: socially    Drug use: No    Types: Oxycodone, Hydrocodone    Comment: Quit 04/11/2012 herion two years ago   Sexual activity: Not Currently    Birth control/protection: None  Other Topics Concern   Not on file  Social History Narrative   Not on file   Social Determinants of Health    Financial Resource Strain: Not on file  Food Insecurity: Not on file  Transportation Needs: Not on file  Physical Activity: Not on file  Stress: Not on file  Social Connections: Not on file  Intimate Partner Violence: Not on file     Physical Exam   Vitals:   01/14/22 0050 01/14/22 0052  BP: (!) 66/50 118/84  Pulse: (!) 58   Resp: 16   Temp: 97.6 F (36.4 C)   SpO2: 94%     CONSTITUTIONAL: Well-appearing, NAD NEURO/PSYCH:  Alert and oriented x 3, no focal deficits EYES:  eyes equal and reactive ENT/NECK:  no LAD, no JVD CARDIO: Regular rate, well-perfused, normal S1 and S2 PULM:  CTAB no wheezing or rhonchi GI/GU:  non-distended, non-tender MSK/SPINE:  No gross deformities, no edema SKIN:  no rash, atraumatic   *Additional and/or pertinent findings included in MDM below  Diagnostic and Interventional Summary    EKG Interpretation  Date/Time:  Saturday January 14 2022 01:01:48 EDT Ventricular Rate:  61 PR Interval:  188 QRS Duration: 92 QT Interval:  418 QTC Calculation: 420 R Axis:   76 Text Interpretation: Normal sinus rhythm Septal infarct , age undetermined Abnormal ECG When compared with ECG of 13-Jan-2022 13:52, PREVIOUS ECG IS  PRESENT Confirmed by Kennis Carina [phone]) on 01/14/2022 2:04:15 AM       Labs Reviewed  COMPREHENSIVE METABOLIC PANEL - Abnormal; Notable for the following components:      Result Value   Glucose, Bld 115 (*)    All other components within normal limits  CBG MONITORING, ED - Abnormal; Notable for the following components:   Glucose-Capillary 117 (*)    All other components within normal limits  CBC    CT HEAD WO CONTRAST ( )  Final Result    CT CERVICAL SPINE WO CONTRAST  Final Result      Medications  acetaminophen (TYLENOL) tablet 1,000 mg (1,000 mg Oral Given 01/14/22 0134)     Procedures  /  Critical Care Procedures  ED Course and Medical Decision Making  Initial Impression and Ddx We will need CT imaging  to exclude cervical spinal fracture, intracranial bleeding.  Screening labs to evaluate for etiology of syncope though withdrawal, dehydration, orthostatic event is most likely.  Past medical/surgical history that increases complexity of ED encounter: Depression  Interpretation of Diagnostics I personally reviewed the EKG and my interpretation is as follows: Sinus rhythm, normal intervals  Labs reassuring with no significant blood count or electrolyte disturbance  Patient Reassessment and Ultimate Disposition/Management     Patient feels well on reassessment, continued normal vital signs, with reassuring work-up she is appropriate for discharge.  Patient management required discussion with the following services or consulting groups:  None  Complexity of Problems Addressed Acute illness or injury that poses threat of life of bodily function  Additional Data Reviewed and Analyzed Further history obtained from: Recent Consult notes  Additional Factors Impacting ED Encounter Risk None  Elmer Sow. Pilar Plate, MD [facility] Emergency Medicine [facility] [email or web address]  Final Clinical Impressions(s) / ED Diagnoses     ICD-10-CM   1. Fall, initial encounter  W19.XXXA     2. Injury of head, initial encounter  S09.90XA     3. Syncope, unspecified syncope type  R55       ED Discharge Orders     None        Discharge Instructions Discussed with and Provided to Patient:     Discharge Instructions      You were evaluated in the Emergency Department and after careful evaluation, we did not find any emergent condition requiring admission or further testing in the hospital.  Your exam/testing today was overall reassuring.  Suspect your fainting spell is related to withdrawal and some dehydration.  Important to drink plenty of fluids and use caution when changing positions or standing from a seated position.  Use Tylenol or Motrin at home for any  soreness.  Please return to the Emergency Department if you experience any worsening of your condition.  Thank you for allowing [place] to be a part of your care.        Sabas Sous, MD 01/14/22 [phone]

## 2022-01-14 NOTE — Progress Notes (Signed)
Per Cecilio Asper, NP, patient meets criteria for inpatient treatment. There are no available beds at [facility] today. CSW faxed referrals to the following facilities for review:  [facility]  Pending - No Request Sent N/A [address], [place] [address] [phone] --  [facility]  Pending - No Request Sent N/A [address] [place] [address] [phone] --  [facility]  Pending - No Request Sent N/A [address] [place] [address] [phone] --  [facility]  Pending - No Request Sent N/A [address] [place] [address] [phone] --  [facility]  Pending - No Request Sent N/A [address] [place] [address] [phone] --  [facility]  Pending - No Request Sent N/A [address] [place] [address] [phone] --  [facility]  Pending - No Request Sent N/A [address] [place], [place] [address] [phone] --  [facility]  Pending - No Request Sent N/A [address] [place] [address] [phone] --  [facility]  Pending - No Request Sent N/A [address], [place] [address] [phone] --  [facility]  Pending - No Request Sent N/A [address] [place] [address] [phone] --  [facility]  Pending - No Request Sent N/A [address] [place] [address] [phone] --  [facility]  Pending - No Request Sent N/A [address] [place] [address] [phone] --  [facility]  Pending - No Request Sent N/A [address] [place] [address] [phone] --  [facility]  Pending -  No Request Sent N/A [name] [place] [address] [phone] --  [facility]  Pending - No Request Sent N/A [address] [place] [address] [phone] --  [facility]  Pending - No Request Sent N/A [address] [place] [address] [phone] --   TTS will continue to seek bed placement.  Crissie Reese, MSW, Lenice Pressman Phone: [phone] Disposition/TOC

## 2022-01-14 NOTE — ED Notes (Signed)
Pt sleeping@this time. Breathing even and unlabored. Will continue to monitor for safety 

## 2022-01-14 NOTE — ED Triage Notes (Signed)
Pt BIB [facility] from [facility] after staff found pt in BR floor, pt reports she does not remember falling but waking up after her "legs felt like jello", v/s upon arrival 148/90, HR 60, CBG 92, 96% RA

## 2022-01-14 NOTE — ED Notes (Signed)
Pt laying in bed calm and cooperative. No c/o pain to distress. Will continue to  monitor for safety

## 2022-01-14 NOTE — ED Notes (Signed)
Patient back to unit - will continue to monitor for safety

## 2022-01-14 NOTE — ED Notes (Signed)
Heidi Pearson remained in the milieu throughout the day and sociable with her peers. She was medicated per order and after receiving her first dose of Ativan eventually drifted off to sleep and self woke up at 1700 hrs. She napped for 1.5 hrs. She ate dinner. No c/o of discomfort.

## 2022-01-15 DIAGNOSIS — Z20822 Contact with and (suspected) exposure to covid-19: Secondary | ICD-10-CM | POA: Diagnosis not present

## 2022-01-15 DIAGNOSIS — F909 Attention-deficit hyperactivity disorder, unspecified type: Secondary | ICD-10-CM | POA: Diagnosis not present

## 2022-01-15 DIAGNOSIS — F1020 Alcohol dependence, uncomplicated: Secondary | ICD-10-CM | POA: Diagnosis not present

## 2022-01-15 DIAGNOSIS — F332 Major depressive disorder, recurrent severe without psychotic features: Secondary | ICD-10-CM | POA: Diagnosis not present

## 2022-01-15 MED ORDER — GABAPENTIN 100 MG PO CAPS
ORAL_CAPSULE | ORAL | Status: AC
  Filled 2022-01-15: qty 1

## 2022-01-15 MED ORDER — GABAPENTIN 300 MG PO CAPS
300.0000 mg | ORAL_CAPSULE | Freq: Three times a day (TID) | ORAL | Status: DC
Start: 2022-01-15 — End: 2022-01-16
  Administered 2022-01-15 (×3): 300 mg via ORAL
  Filled 2022-01-15 (×4): qty 1

## 2022-01-15 MED ORDER — TRAZODONE HCL 50 MG PO TABS
50.0000 mg | ORAL_TABLET | Freq: Every evening | ORAL | Status: DC | PRN
  Administered 2022-01-15: 50 mg via ORAL
  Filled 2022-01-15: qty 1

## 2022-01-15 NOTE — ED Provider Notes (Cosign Needed)
Notified by patient's nurse that patient is complaining of chest pain 7/10. This NP presented to the unit for a face to face evaluation. On assessment, patient is sitting at nurse's station. She is complaining of "dizziness, chest tightness and shooting pain 6/10." She states "this is not anxiety, something is wrong, I've never had this type of pain before." EKG obtained and no new changes noted. PRN Tylenol, Hydroxyzine, and Maalox given. Patient denies relief. Will transfer patient to [facility] for medical clearance; report called to EDP Dr. Hyacinth Meeker. Patient may return to [facility] when medically clear.  Patient is under Involuntary Commitment    BP 125/77 (BP Location: Right Arm)   Pulse 78   Temp 97.8 F (36.6 C) (Oral)   Resp 18   LMP 01/07/2022 (Exact Date)   SpO2 94%

## 2022-01-15 NOTE — ED Notes (Signed)
Pt reports poor sleep overnight, states she was given prn medications but none seemed to help with sleep.  Breathing is even and unlabored.  Pt is Aox4.  Denies SI, HI, and AVH.  Pt reports leg cramps,  comfort measures provided.  Will continue to monitor for safety.

## 2022-01-15 NOTE — ED Notes (Addendum)
Patient was admitted to [facility] from the OBS unit. Patient denies SI/HI and AVH. Patient denies pain reports she is going through withdrawals but not direct pain. "Feels like my skin is crawling". Patient was oriented to the unit and was lying down resting peacefully. Patient is being monitored for safety.

## 2022-01-15 NOTE — ED Notes (Addendum)
Pt up to nurse's station, tearful and reports "stabbing and sharp" pain 7/10 and "tightness" to the center of her chest and "shooting down my back." Pt reports she feels like she is going to pass out. Pt states, "I know this isn't a panic attack. I'm scared because of the medication I'm taking. I'm concerned because this came out of nowhere." Vital signs obtained. Respirations even and unlabored. PRN Tylenol and Hydroxyzine administered per MAR. Cecilio Asper, NP notified and advised to obtain EKG. NP to unit to assess pt. Pt ambulated independently to exam room for MHT to obtain EKG.

## 2022-01-15 NOTE — ED Notes (Signed)
Pt is awake and having c/o not being able to sleep ,and nausea. She was given Zofran. Pt has spoken with Child psychotherapist and now is requesting to speak with NP. She is requesting to go home even though I explained she is IVC. She has also requested eye drops

## 2022-01-15 NOTE — Progress Notes (Signed)
CSW reached out to [facility] in reference to bed availability. It was reported that there are currently no beds available.  Crissie Reese, MSW, Lenice Pressman Phone: [phone] Disposition/TOC

## 2022-01-15 NOTE — ED Notes (Signed)
Pt request to try Trazodone tonight for sleep. Writer notified provider of pt request. Medication ordered. Pt made aware and appreciative. Informed pt to notify staff with any needs or concerns. Will continue to monitor for safety.

## 2022-01-15 NOTE — ED Provider Notes (Signed)
Facility Based Crisis Admission H&P  Date: 01/15/22 Patient Name: Heidi Pearson MRN: [id number] Chief Complaint:  Chief Complaint  Patient presents with   Addiction Problem      Diagnoses:  Final diagnoses:  Uncomplicated alcohol dependence (HCC)  Severe episode of recurrent major depressive disorder, without psychotic features (HCC)    HPI: Heidi Pearson 46 y.o., female patient presented to [facility] and was admitted to the continuous assessment unit while awaiting inpatient psychiatric bed availability.  She was placed under involuntary commitment while on the unit.   Per IVC.  Petitioned by Doran Heaterina Allen, NP (mental health provider) "patient reports suicidal ideation two days ago, thoughts of "getting heroin from across the street and overdosing".  She has been diagnosed with mental illness and is not currently compliant with medications.  She has a history of previous suicide attempts.  Her primary emotional support individual states "I do not think she is safe, I think she will hurt herself if she leaves 100%".  Recent stressors include the upcoming anniversary of the death of patient's son who overdosed on heroin 6 years ago".    Heidi Pearson, 46 y.o., female patient seen face to face by this provider, consulted with Dr. Gasper Sellsinderella; and chart reviewed on 01/15/22.  Per chart review patient has a history of anxiety, panic attacks, PTSD, MDD, ADHD, and alcohol dependence.  During evaluation Heidi Pearson is observed sitting up in her bed awake.  She is alert/oriented x4, cooperative, and attentive.She has normal speech.  Reports she was unable to sleep last night due to the noise on the unit and a patient beside of her snoring all night.  She also expresses how difficult it was to have a patient come in who appeared "high".  States this triggered her cravings for opioids, "I thought man it would be nice to be that relaxed right now".  She continues to endorse anxiety and  depression.  She adamantly denies SI and easily contracts for safety.  She denies any plan, intent, or access to means.  She identifies her son who lives in [place] and her 2 grandchildren as her protective factors. She is adamant she has made no SI attempt. When asking about suicidal thoughts she was having about overdosing with heroin a few days ago.  She responds those were just thoughts and she would have never have acted on them.  She denies having any suicidal thoughts since that time.  States, "I need to be there for them".  She plans to move to [place] to be with her family upon discharge. She is forward thinking.  She denies HI/AVH.  She does not appear to be responding to internal/external stimuli.  She is tolerating the Wellbutrin and Ativan taper without any adverse reactions.  Upon admission patient was initially recommended for inpatient psychiatric admission.  However she has remained calm and cooperative on the unit and has exhibited no unsafe behaviors.  She has continued to deny SI/HI/AVH.  [facility]. Explained milieu and expectations.  Patient is in agreement.   PHQ 2-9:  [facility]  Thoughts that you would be better off dead, or of hurting yourself in some way More than half the days  PHQ-9 Total Score 18       Flowsheet Row [facility]  ED from 01/11/2022 in [facility]  C-SSRS RISK CATEGORY High Risk High Risk High Risk        Total Time spent with patient: 30 minutes  Musculoskeletal  Strength & Muscle Tone: within normal limits Gait & Station: normal Patient leans: N/A  Psychiatric Specialty Exam  Presentation General Appearance: Casual  Eye Contact:Good  Speech:Clear and Coherent; Normal Rate  Speech  Volume:Normal  Handedness:Right   Mood and Affect  Mood:Anxious; Depressed  Affect:Tearful; Congruent; Depressed   Thought Process  Thought Processes:Coherent  Descriptions of Associations:Intact  Orientation:Full (Time, Place and Person)  Thought Content:Logical  Diagnosis of Schizophrenia or Schizoaffective disorder in past: No   Hallucinations:Hallucinations: None  Ideas of Reference:None  Suicidal Thoughts:Suicidal Thoughts: No  Homicidal Thoughts:Homicidal Thoughts: No   Sensorium  Memory:Immediate Good; Recent Good; Remote Good  Judgment:Fair  Insight:Good   Executive Functions  Concentration:Good  Attention Span:Good  Recall:Good  Fund of Knowledge:Good  Language:Good   Psychomotor Activity  Psychomotor Activity:Psychomotor Activity: Normal   Assets  Assets:Communication Skills; Desire for Improvement; Financial Resources/Insurance; Housing; Physical Health; Resilience; Social Support   Sleep  Sleep:Sleep: Poor Number of Hours of Sleep: 4   Nutritional Assessment (For OBS and [facility]BC admissions only) Has the patient had a decrease in food intake/or appetite?: No Does the patient have dental problems?: No Does the patient have eating habits or behaviors that may be indicators of an eating disorder including binging or inducing vomiting?: No Has the patient recently lost weight without trying?: 0 Has the patient been eating poorly because of a decreased appetite?: 0 Malnutrition Screening Tool Score: 0    Physical Exam Vitals and nursing note reviewed.  Constitutional:      General: She is not in acute distress.    Appearance: Normal appearance. She is not ill-appearing.  HENT:     Head: Normocephalic.  Eyes:     General:        Right eye: No discharge.        Left eye: No discharge.     Conjunctiva/sclera: Conjunctivae normal.  Cardiovascular:     Rate and Rhythm: Normal rate.  Pulmonary:     Effort: Pulmonary effort is normal.   Musculoskeletal:        General: Normal range of motion.     Cervical back: Normal range of motion.  Skin:    Coloration: Skin is not jaundiced or pale.  Neurological:     Mental Status: She is alert and oriented to person, place, and time.  Psychiatric:        Attention and Perception: Attention and perception normal.        Mood and Affect: Mood is anxious and depressed. Affect is tearful.        Speech: Speech normal.        Behavior: Behavior normal. Behavior is cooperative.        Thought Content: Thought content normal.        Cognition and Memory: Cognition normal.        Judgment: Judgment is impulsive.    Review of Systems  Constitutional: Negative.   HENT: Negative.    Eyes: Negative.   Respiratory: Negative.    Cardiovascular: Negative.   Gastrointestinal: Negative.   Musculoskeletal: Negative.   Skin: Negative.   Neurological: Negative.   Psychiatric/Behavioral:  Positive for depression. The patient is nervous/anxious.     Blood pressure 108/74, pulse 86, temperature 98.6 F (37 C), temperature source Oral, resp. rate 18,  last menstrual period 01/07/2022, SpO2 98 %. There is no height or weight on file to calculate BMI.  Past Psychiatric History: anxiety, panic attacks, PTSD, MDD, ADHD, and alcohol dependence.  Is the patient at risk to self? No  Has the patient been a risk to self in the past 6 months? Yes .    Has the patient been a risk to self within the distant past? Yes   Is the patient a risk to others? No   Has the patient been a risk to others in the past 6 months? No   Has the patient been a risk to others within the distant past? No   Past Medical History:  Past Medical History:  Diagnosis Date   ADD (attention deficit disorder)    Anxiety    Anxiety    Chronic knee pain    Depression    PTSD (post-traumatic stress disorder)     Past Surgical History:  Procedure Laterality Date   CESAREAN SECTION      Family History:  Family History   Problem Relation Age of Onset   Hypertension Mother    Cancer Mother    Hypertension Father     Social History:  Social History   Socioeconomic History   Marital status: Divorced    Spouse name: Not on file   Number of children: Not on file   Years of education: Not on file   Highest education level: Not on file  Occupational History   Not on file  Tobacco Use   Smoking status: Never   Smokeless tobacco: Never  Vaping Use   Vaping Use: Never used  Substance and Sexual Activity   Alcohol use: Yes    Comment: socially    Drug use: No    Types: Oxycodone, Hydrocodone    Comment: Quit 04/11/2012 herion two years ago   Sexual activity: Not Currently    Birth control/protection: None  Other Topics Concern   Not on file  Social History Narrative   Not on file   Social Determinants of Health   Financial Resource Strain: Not on file  Food Insecurity: Not on file  Transportation Needs: Not on file  Physical Activity: Not on file  Stress: Not on file  Social Connections: Not on file  Intimate Partner Violence: Not on file    SDOH:  SDOH Screenings   Depression (PHQ2-9): High Risk (01/13/2022)  Tobacco Use: Low Risk  (01/14/2022)    Last Labs:  Admission on 01/14/2022, Discharged on 01/14/2022  Component Date Value Ref Range Status   Glucose-Capillary 01/14/2022 117 (H)  70 - 99 mg/dL Final   Glucose reference range applies only to samples taken after fasting for at least 8 hours.   Comment 1 01/14/2022 Notify RN   Final   Comment 2 01/14/2022 Document in Chart   Final   WBC 01/14/2022 6.2  4.0 - 10.5 K/uL Final   RBC 01/14/2022 4.14  3.87 - 5.11 MIL/uL Final   Hemoglobin 01/14/2022 12.0  12.0 - 15.0 g/dL Final   HCT 03/17/1593 38.1  36.0 - 46.0 % Final   MCV 01/14/2022 92.0  80.0 - 100.0 fL Final   MCH 01/14/2022 29.0  26.0 - 34.0 pg Final   MCHC 01/14/2022 31.5  30.0 - 36.0 g/dL Final   RDW 58/59/2924 12.1  11.5 - 15.5 % Final   Platelets 01/14/2022 332  150 -  400 K/uL Final   nRBC 01/14/2022 0.0  0.0 - 0.2 % Final  Performed at [facility], [address], [place], [place] [address]   Sodium 01/14/2022 138  135 - 145 mmol/L Final   Potassium 01/14/2022 3.8  3.5 - 5.1 mmol/L Final   Chloride 01/14/2022 104  98 - 111 mmol/L Final   CO2 01/14/2022 25  22 - 32 mmol/L Final   Glucose, Bld 01/14/2022 115 (H)  70 - 99 mg/dL Final   Glucose reference range applies only to samples taken after fasting for at least 8 hours.   BUN 01/14/2022 11  6 - 20 mg/dL Final   Creatinine, Ser 01/14/2022 0.81  0.44 - 1.00 mg/dL Final   Calcium 29/51/8841 9.0  8.9 - 10.3 mg/dL Final   Total Protein 66/10/3014 6.7  6.5 - 8.1 g/dL Final   Albumin 05/23/3233 3.6  3.5 - 5.0 g/dL Final   AST 57/32/2025 28  15 - 41 U/L Final   ALT 01/14/2022 34  0 - 44 U/L Final   Alkaline Phosphatase 01/14/2022 86  38 - 126 U/L Final   Total Bilirubin 01/14/2022 0.4  0.3 - 1.2 mg/dL Final   GFR, Estimated 01/14/2022 >60  >60 mL/min Final   Comment: (NOTE) Calculated using the CKD-EPI Creatinine Equation (2021)    Anion gap 01/14/2022 9  5 - 15 Final   Performed at [facility], [address] [place], [place] [address]  Admission on 01/13/2022, Discharged on 01/14/2022  Component Date Value Ref Range Status   SARS Coronavirus 2 by RT PCR 01/13/2022 NEGATIVE  NEGATIVE Final   Comment: (NOTE) SARS-CoV-2 target nucleic acids are NOT DETECTED.  The SARS-CoV-2 RNA is generally detectable in upper respiratory specimens during the acute phase of infection. The lowest concentration of SARS-CoV-2 viral copies this assay can detect is 138 copies/mL. A negative result does not preclude SARS-Cov-2 infection and should not be used as the sole basis for treatment or other patient management decisions. A negative result may occur with  improper specimen collection/handling, submission of specimen other than nasopharyngeal swab, presence of viral mutation(s) within the areas  targeted by this assay, and inadequate number of viral copies(<138 copies/mL). A negative result must be combined with clinical observations, patient history, and epidemiological information. The expected result is Negative.  Fact Sheet for Patients:  BloggerCourse.com  Fact Sheet for Healthcare Providers:  SeriousBroker.it  This test is no                          t yet approved or cleared by the [place] FDA and  has been authorized for detection and/or diagnosis of SARS-CoV-2 by FDA under an Emergency Use Authorization (EUA). This EUA will remain  in effect (meaning this test can be used) for the duration of the COVID-19 declaration under Section 564(b)(1) of the Act, 21 U.S.C.section 360bbb-3(b)(1), unless the authorization is terminated  or revoked sooner.       Influenza A by PCR 01/13/2022 NEGATIVE  NEGATIVE Final   Influenza B by PCR 01/13/2022 NEGATIVE  NEGATIVE Final   Comment: (NOTE) The Xpert Xpress SARS-CoV-2/FLU/RSV plus assay is intended as an aid in the diagnosis of influenza from Nasopharyngeal swab specimens and should not be used as a sole basis for treatment. Nasal washings and aspirates are unacceptable for Xpert Xpress SARS-CoV-2/FLU/RSV testing.  Fact Sheet for Patients: BloggerCourse.com  Fact Sheet for Healthcare Providers: SeriousBroker.it  This test is not yet approved or cleared by the [place] FDA and has been  authorized for detection and/or diagnosis of SARS-CoV-2 by FDA under an Emergency Use Authorization (EUA). This EUA will remain in effect (meaning this test can be used) for the duration of the COVID-19 declaration under Section 564(b)(1) of the Act, 21 U.S.C. section 360bbb-3(b)(1), unless the authorization is terminated or revoked.  Performed at [facility], [address] [place], [place] [address]    WBC 01/13/2022  7.2  4.0 - 10.5 K/uL Final   RBC 01/13/2022 4.14  3.87 - 5.11 MIL/uL Final   Hemoglobin 01/13/2022 12.3  12.0 - 15.0 g/dL Final   HCT 53/97/6734 36.4  36.0 - 46.0 % Final   MCV 01/13/2022 87.9  80.0 - 100.0 fL Final   MCH 01/13/2022 29.7  26.0 - 34.0 pg Final   MCHC 01/13/2022 33.8  30.0 - 36.0 g/dL Final   RDW 19/37/9024 12.1  11.5 - 15.5 % Final   Platelets 01/13/2022 350  150 - 400 K/uL Final   nRBC 01/13/2022 0.0  0.0 - 0.2 % Final   Neutrophils Relative % 01/13/2022 65  % Final   Neutro Abs 01/13/2022 4.6  1.7 - 7.7 K/uL Final   Lymphocytes Relative 01/13/2022 26  % Final   Lymphs Abs 01/13/2022 1.8  0.7 - 4.0 K/uL Final   Monocytes Relative 01/13/2022 7  % Final   Monocytes Absolute 01/13/2022 0.5  0.1 - 1.0 K/uL Final   Eosinophils Relative 01/13/2022 2  % Final   Eosinophils Absolute 01/13/2022 0.1  0.0 - 0.5 K/uL Final   Basophils Relative 01/13/2022 0  % Final   Basophils Absolute 01/13/2022 0.0  0.0 - 0.1 K/uL Final   Immature Granulocytes 01/13/2022 0  % Final   Abs Immature Granulocytes 01/13/2022 0.02  0.00 - 0.07 K/uL Final   Performed at [facility], [address], [place], [place] [address]   Sodium 01/13/2022 141  135 - 145 mmol/L Final   Potassium 01/13/2022 3.9  3.5 - 5.1 mmol/L Final   Chloride 01/13/2022 102  98 - 111 mmol/L Final   CO2 01/13/2022 27  22 - 32 mmol/L Final   Glucose, Bld 01/13/2022 103 (H)  70 - 99 mg/dL Final   Glucose reference range applies only to samples taken after fasting for at least 8 hours.   BUN 01/13/2022 10  6 - 20 mg/dL Final   Creatinine, Ser 01/13/2022 0.78  0.44 - 1.00 mg/dL Final   Calcium 32/99/2426 9.6  8.9 - 10.3 mg/dL Final   Total Protein 83/41/9622 6.5  6.5 - 8.1 g/dL Final   Albumin 29/79/8921 3.6  3.5 - 5.0 g/dL Final   AST 19/41/7408 30  15 - 41 U/L Final   ALT 01/13/2022 35  0 - 44 U/L Final   Alkaline Phosphatase 01/13/2022 82  38 - 126 U/L Final   Total Bilirubin 01/13/2022 0.5  0.3 - 1.2 mg/dL Final    GFR, Estimated 01/13/2022 >60  >60 mL/min Final   Comment: (NOTE) Calculated using the CKD-EPI Creatinine Equation (2021)    Anion gap 01/13/2022 12  5 - 15 Final   Performed at [facility], [address] [place], [place] [address]   Hgb A1c MFr Bld 01/13/2022 5.4  4.8 - 5.6 % Final   Comment: (NOTE) Pre diabetes:          5.7%-6.4%  Diabetes:              >6.4%  Glycemic control for   <7.0% adults  with diabetes    Mean Plasma Glucose 01/13/2022 108.28  mg/dL Final   Performed at [facility], [address], [place], [place] [address]   Magnesium 01/13/2022 1.9  1.7 - 2.4 mg/dL Final   Performed at [facility], [address] [place], [place] [address]   Alcohol, Ethyl (B) 01/13/2022 <10  <10 mg/dL Final   Comment: (NOTE) Lowest detectable limit for serum alcohol is 10 mg/dL.  For medical purposes only. Performed at [facility], [address] [place], [place] [address]    Cholesterol 01/13/2022 215 (H)  0 - 200 mg/dL Final   Triglycerides 12/87/8676 208 (H)  <150 mg/dL Final   HDL 72/01/4708 44  >40 mg/dL Final   Total CHOL/HDL Ratio 01/13/2022 4.9  RATIO Final   VLDL 01/13/2022 42 (H)  0 - 40 mg/dL Final   LDL Cholesterol 01/13/2022 129 (H)  0 - 99 mg/dL Final   Comment:        Total Cholesterol/HDL:CHD Risk Coronary Heart Disease Risk Table                     Men   Women  1/2 Average Risk   3.4   3.3  Average Risk       5.0   4.4  2 X Average Risk   9.6   7.1  3 X Average Risk  23.4   11.0        Use the calculated Patient Ratio above and the CHD Risk Table to determine the patient's CHD Risk.        ATP III CLASSIFICATION (LDL):  <100     mg/dL   Optimal  628-366  mg/dL   Near or Above                    Optimal  130-159  mg/dL   Borderline  294-765  mg/dL   High  >465     mg/dL   Very High Performed at [facility], [address] [place], [place] [address]    TSH 01/13/2022 1.083  0.350 - 4.500 uIU/mL Final    Comment: Performed by a 3rd Generation assay with a functional sensitivity of <=0.01 uIU/mL. Performed at [facility], [address], [place], [place] [address]    Prolactin 01/13/2022 22.8  4.8 - 23.3 ng/mL Final   Comment: (NOTE) Performed At: [facility] [address] [place], [place] [id number] Jolene Schimke MD BS:[id number]    POC Amphetamine UR 01/13/2022 None Detected  NONE DETECTED (Cut Off Level 1000 ng/mL) Final   POC Secobarbital (BAR) 01/13/2022 None Detected  NONE DETECTED (Cut Off Level 300 ng/mL) Final   POC Buprenorphine (BUP) 01/13/2022 Positive (A)  NONE DETECTED (Cut Off Level 10 ng/mL) Final   POC Oxazepam (BZO) 01/13/2022 Positive (A)  NONE DETECTED (Cut Off Level 300 ng/mL) Final   POC Cocaine UR 01/13/2022 None Detected  NONE DETECTED (Cut Off Level 300 ng/mL) Final   POC Methamphetamine UR 01/13/2022 None Detected  NONE DETECTED (Cut Off Level 1000 ng/mL) Final   POC Morphine 01/13/2022 None Detected  NONE DETECTED (Cut Off Level 300 ng/mL) Final   POC Methadone UR 01/13/2022 None Detected  NONE DETECTED (Cut Off Level 300 ng/mL) Final   POC Oxycodone UR 01/13/2022 None Detected  NONE DETECTED (Cut Off Level 100 ng/mL) Final   POC Marijuana UR 01/13/2022 Positive (A)  NONE DETECTED (Cut Off  Level 50 ng/mL) Final   SARSCOV2ONAVIRUS 2 AG 01/13/2022 NEGATIVE  NEGATIVE Final   Comment: (NOTE) SARS-CoV-2 antigen NOT DETECTED.   Negative results are presumptive.  Negative results do not preclude SARS-CoV-2 infection and should not be used as the sole basis for treatment or other patient management decisions, including infection  control decisions, particularly in the presence of clinical signs and  symptoms consistent with COVID-19, or in those who have been in contact with the virus.  Negative results must be combined with clinical observations, patient history, and epidemiological information. The expected result is Negative.  Fact Sheet for  Patients: https://www.jennings-kim.com/  Fact Sheet for Healthcare Providers: https://alexander-rogers.biz/  This test is not yet approved or cleared by the [place] FDA and  has been authorized for detection and/or diagnosis of SARS-CoV-2 by FDA under an Emergency Use Authorization (EUA).  This EUA will remain in effect (meaning this test can be used) for the duration of  the COV                          ID-19 declaration under Section 564(b)(1) of the Act, 21 U.S.C. section 360bbb-3(b)(1), unless the authorization is terminated or revoked sooner.     Preg Test, Ur 01/13/2022 NEGATIVE  NEGATIVE Final   Comment:        THE SENSITIVITY OF THIS METHODOLOGY IS >24 mIU/mL     Allergies: Morphine and related and Paxil [paroxetine]  PTA Medications: (Not in a hospital admission)   Long Term Goals: Improvement in symptoms so as ready for discharge  Short Term Goals: Patient will verbalize feelings in meetings with treatment team members., Patient will attend at least of 50% of the groups daily., Pt will complete the PHQ9 on admission, day 3 and discharge., Patient will participate in completing the [place] Suicide Severity Rating Scale, Patient will score a low risk of violence for 24 hours prior to discharge, and Patient will take medications as prescribed daily.  Medical Decision Making  Upon admission patient was initially recommended for inpatient psychiatric admission.  However she has remained calm and cooperative on the unit and has exhibited no unsafe behaviors.  She has continued to deny SI/HI/AVH.  She meets criteria for treatment in the [facility]. Explained milieu and expectations.  Patient is in agreement. She will continue the ativan taper.     Recommendations  Based on my evaluation the patient does not appear to have an emergency medical condition.  Disposition: Patient meets criteria for treatment in the [facility]. Explained milieu and expectations.   Patient is in agreement.  continue the ativan taper.     Ardis Hughs, NP 01/15/22  12:28 PM

## 2022-01-15 NOTE — Progress Notes (Addendum)
CSW met with pt privately to discuss pt's goal for treatment. Pt shares that she wants to be sober, but also admits that she is strongly struggling with detox symptoms.Pt reports that she has restless legs, and feels overall uncomfortable. Pt reports that she is triggered seeing other pt's actively intoxicated and shares with this CSW that if she could use right now she would. Pt reports that her drug of choice is opiates. Pt informed this CSW that her youngest son overdosed and passed away due to Fentanyl.  Pt shares that she has an older son and two grandchildren who she is looking to spend time with. Pt reports that her support system is strong, and advised that her son wants her to come live with him and his family. Pt shares that she needs to be sober first before being around her grandchildren. CSW encouraged pt and pt informed that she is willing to try to stay calm and make things work while waiting for an inpatient bed.  CSW reached out to [place], RN to review pt at [facility]. CSW was informed that there is no beds for pt at [facility] but to follow up after discharges. CSW added first shift for follow up. CSW sent referral for the out of network providers:  Service Provider Address Phone Fax  [facility]  [address], [place] [address] [phone]  [address] [place] [address] [phone]  [facility]  [address] [place] [address] [phone]  [facility]  [address] [place] [address] [place]  [facility]  [address], [place] [address] [phone]  [place], [place] [address] [phone]  [facility]  [address] [place], [place] [address] [phone] [facility]  [address] [place] [address] [place] [address] [place]  [facility]  [address] [place] [address] [phone]  [facility]  [address], [place] [address] [phone]  [facility]  [address] [place] [address] [phone] [place], [place] [address] [phone] [facility]  [address] [place] [address] [phone]  [facility]  [address] [place] [address] [phone] [facility]  [address] [place]  [address] [phone]  [facility]  [address] [place] [address] [place]  [facility]  [place], [place] [address] [phone]  [facility]  [address] [place] [address] [phone]  [facility]  [address] [place] [address] [phone]  [facility]  [address], [place] [address] [phone] [facility]  [address], [place] [address] [facility]  [address] [place] [address] [phone]

## 2022-01-15 NOTE — ED Notes (Addendum)
Pt sitting in dining room watching TV. A&O x4, calm and cooperative. Denies current SI/HI/AVH. No signs of distress noted. Pt reports nausea due to something she ate earlier. PRN Zofran administered. Monitoring for safety.

## 2022-01-15 NOTE — ED Notes (Signed)
Pt is stating that people are here to just get a bed and I told her she cannot assume why people are here and it can not be explain to here why people are here. She only need to worry about herself. She is stating she wish she could break out of the door I explained to her she is IVC. She constantly  keep asking for medications. She constantly hitting the wall with her feet

## 2022-01-15 NOTE — ED Notes (Incomplete)
Heidi Pearson is currently awake and requesting something for anxiety related to withdraw. She was medicated for nausea at 20:50 and for sleep at 22:54 and is now requesting something for anxiety

## 2022-01-15 NOTE — ED Notes (Addendum)
Pt refused to take Gabapentin. Pt states, It makes me too sleepy. I would rather take it tonight and skip this one". Writer returned Gabapentin to [employer]. Pt returned to room. Currently, laying on bed with eyes closed. Will continue to monitor for safety.

## 2022-01-15 NOTE — Progress Notes (Signed)
CSW spoke with Becky with [facility] admissions. It was reported that the patient is under further review.  Rhia Blatchford, MSW, LCSW-A, LCAS Phone: [phone] Disposition/TOC   

## 2022-01-15 NOTE — ED Notes (Signed)
Report called to Callie Fielding

## 2022-01-15 NOTE — ED Notes (Signed)
Emergent EMS called for transport to [facility]

## 2022-01-15 NOTE — ED Notes (Signed)
EMS arrived to transport pt to [facility]. Pt A&O x4, ambulatory. Reports she feels worse and pain is now radiating down left leg. Pt escorted on stretcher to back [facility] to ambulance without issue. MHT to ride with pt to ED due to IVC status. Safety maintained.

## 2022-01-16 ENCOUNTER — Emergency Department (HOSPITAL_COMMUNITY): Payer: Self-pay

## 2022-01-16 ENCOUNTER — Other Ambulatory Visit (HOSPITAL_COMMUNITY)
Admission: EM | Admit: 2022-01-16 | Discharge: 2022-01-18 | Disposition: A | Discharge status: Other Acute Inpatient Hospital | Payer: No Payment, Other | Attending: Psychiatry | Admitting: Psychiatry

## 2022-01-16 ENCOUNTER — Encounter (HOSPITAL_COMMUNITY): Payer: Self-pay | Admitting: Psychiatry

## 2022-01-16 ENCOUNTER — Emergency Department (HOSPITAL_COMMUNITY)
Admission: EM | Admit: 2022-01-16 | Discharge: 2022-01-16 | Disposition: A | Discharge status: Other Acute Inpatient Hospital | Payer: Self-pay | Attending: Emergency Medicine | Admitting: Emergency Medicine

## 2022-01-16 DIAGNOSIS — R45851 Suicidal ideations: Secondary | ICD-10-CM | POA: Insufficient documentation

## 2022-01-16 DIAGNOSIS — F4310 Post-traumatic stress disorder, unspecified: Secondary | ICD-10-CM | POA: Diagnosis not present

## 2022-01-16 DIAGNOSIS — F332 Major depressive disorder, recurrent severe without psychotic features: Secondary | ICD-10-CM | POA: Diagnosis not present

## 2022-01-16 DIAGNOSIS — F1020 Alcohol dependence, uncomplicated: Secondary | ICD-10-CM | POA: Insufficient documentation

## 2022-01-16 DIAGNOSIS — Z9151 Personal history of suicidal behavior: Secondary | ICD-10-CM | POA: Insufficient documentation

## 2022-01-16 DIAGNOSIS — M545 Low back pain, unspecified: Secondary | ICD-10-CM | POA: Insufficient documentation

## 2022-01-16 DIAGNOSIS — F909 Attention-deficit hyperactivity disorder, unspecified type: Secondary | ICD-10-CM | POA: Diagnosis not present

## 2022-01-16 DIAGNOSIS — R0789 Other chest pain: Secondary | ICD-10-CM | POA: Insufficient documentation

## 2022-01-16 DIAGNOSIS — M5442 Lumbago with sciatica, left side: Secondary | ICD-10-CM | POA: Insufficient documentation

## 2022-01-16 DIAGNOSIS — W2201XA Walked into wall, initial encounter: Secondary | ICD-10-CM | POA: Insufficient documentation

## 2022-01-16 DIAGNOSIS — Z765 Malingerer [conscious simulation]: Secondary | ICD-10-CM | POA: Insufficient documentation

## 2022-01-16 DIAGNOSIS — F410 Panic disorder [episodic paroxysmal anxiety] without agoraphobia: Secondary | ICD-10-CM | POA: Insufficient documentation

## 2022-01-16 MED ORDER — ADULT MULTIVITAMIN W/MINERALS CH
1.0000 | ORAL_TABLET | Freq: Every day | ORAL | Status: DC
  Administered 2022-01-16 – 2022-01-18 (×3): 1 via ORAL
  Filled 2022-01-16 (×3): qty 1

## 2022-01-16 MED ORDER — METHOCARBAMOL 500 MG PO TABS
500.0000 mg | ORAL_TABLET | Freq: Three times a day (TID) | ORAL | Status: DC | PRN
  Administered 2022-01-18: 500 mg via ORAL
  Filled 2022-01-16: qty 1

## 2022-01-16 MED ORDER — ALUM & MAG HYDROXIDE-SIMETH 200-200-20 MG/5ML PO SUSP
15.0000 mL | Freq: Four times a day (QID) | ORAL | Status: DC | PRN
  Administered 2022-01-16: 15 mL via ORAL
  Filled 2022-01-16: qty 30

## 2022-01-16 MED ORDER — HYDROXYZINE HCL 25 MG PO TABS
25.0000 mg | ORAL_TABLET | Freq: Four times a day (QID) | ORAL | Status: DC | PRN
  Administered 2022-01-16 – 2022-01-18 (×4): 25 mg via ORAL
  Filled 2022-01-16 (×4): qty 1

## 2022-01-16 MED ORDER — NAPROXEN 500 MG PO TABS
500.0000 mg | ORAL_TABLET | Freq: Two times a day (BID) | ORAL | Status: DC | PRN
  Administered 2022-01-18: 500 mg via ORAL
  Filled 2022-01-16 (×3): qty 1

## 2022-01-16 MED ORDER — MELATONIN 3 MG PO TABS
3.0000 mg | ORAL_TABLET | Freq: Every day | ORAL | Status: DC
  Administered 2022-01-16 – 2022-01-17 (×2): 3 mg via ORAL
  Filled 2022-01-16 (×2): qty 1

## 2022-01-16 MED ORDER — TRAZODONE HCL 50 MG PO TABS: 50.0000 mg | ORAL_TABLET | Freq: Every evening | ORAL | Status: DC | PRN

## 2022-01-16 MED ORDER — THIAMINE MONONITRATE 100 MG PO TABS
100.0000 mg | ORAL_TABLET | Freq: Every day | ORAL | Status: DC
  Administered 2022-01-17 – 2022-01-18 (×2): 100 mg via ORAL
  Filled 2022-01-16 (×2): qty 1

## 2022-01-16 MED ORDER — GABAPENTIN 300 MG PO CAPS
300.0000 mg | ORAL_CAPSULE | Freq: Three times a day (TID) | ORAL | Status: DC
  Administered 2022-01-16 – 2022-01-18 (×6): 300 mg via ORAL
  Filled 2022-01-16 (×6): qty 1

## 2022-01-16 MED ORDER — LORAZEPAM 1 MG PO TABS: 1.0000 mg | ORAL_TABLET | Freq: Two times a day (BID) | ORAL | Status: DC

## 2022-01-16 MED ORDER — KETOROLAC TROMETHAMINE 15 MG/ML IJ SOLN
15.0000 mg | Freq: Once | INTRAMUSCULAR | Status: AC
  Administered 2022-01-16: 15 mg via INTRAVENOUS
  Filled 2022-01-16: qty 1

## 2022-01-16 MED ORDER — LORAZEPAM 1 MG PO TABS
1.0000 mg | ORAL_TABLET | Freq: Four times a day (QID) | ORAL | Status: DC | PRN
  Administered 2022-01-17 (×2): 1 mg via ORAL
  Filled 2022-01-16 (×2): qty 1

## 2022-01-16 MED ORDER — DICYCLOMINE HCL 10 MG PO CAPS
20.0000 mg | ORAL_CAPSULE | Freq: Four times a day (QID) | ORAL | Status: DC | PRN
  Administered 2022-01-16 – 2022-01-18 (×2): 20 mg via ORAL
  Filled 2022-01-16 (×2): qty 2

## 2022-01-16 MED ORDER — ONDANSETRON 4 MG PO TBDP
4.0000 mg | ORAL_TABLET | Freq: Four times a day (QID) | ORAL | Status: DC | PRN
  Administered 2022-01-16 – 2022-01-18 (×2): 4 mg via ORAL
  Filled 2022-01-16 (×2): qty 1

## 2022-01-16 MED ORDER — METHOCARBAMOL 500 MG PO TABS
1000.0000 mg | ORAL_TABLET | Freq: Once | ORAL | Status: AC
  Administered 2022-01-16: 1000 mg via ORAL
  Filled 2022-01-16: qty 2

## 2022-01-16 MED ORDER — LOPERAMIDE HCL 2 MG PO CAPS
2.0000 mg | ORAL_CAPSULE | ORAL | Status: DC | PRN
  Administered 2022-01-16 – 2022-01-17 (×2): 2 mg via ORAL
  Administered 2022-01-17: 4 mg via ORAL
  Filled 2022-01-16 (×2): qty 1
  Filled 2022-01-16: qty 2

## 2022-01-16 MED ORDER — LORAZEPAM 1 MG PO TABS: 1.0000 mg | ORAL_TABLET | Freq: Three times a day (TID) | ORAL | Status: DC

## 2022-01-16 MED ORDER — BUPROPION HCL ER (XL) 150 MG PO TB24
150.0000 mg | ORAL_TABLET | Freq: Every day | ORAL | Status: DC
  Filled 2022-01-16 (×3): qty 1

## 2022-01-16 MED ORDER — LORAZEPAM 1 MG PO TABS: 1.0000 mg | ORAL_TABLET | Freq: Once | ORAL | Status: DC

## 2022-01-16 MED ORDER — ACETAMINOPHEN 325 MG PO TABS
650.0000 mg | ORAL_TABLET | Freq: Four times a day (QID) | ORAL | Status: DC | PRN
  Administered 2022-01-16 – 2022-01-18 (×3): 650 mg via ORAL
  Filled 2022-01-16 (×2): qty 2

## 2022-01-16 MED ORDER — LORAZEPAM 1 MG PO TABS
1.0000 mg | ORAL_TABLET | Freq: Two times a day (BID) | ORAL | Status: AC
  Administered 2022-01-16 (×2): 1 mg via ORAL
  Filled 2022-01-16 (×2): qty 1

## 2022-01-16 MED ORDER — LORAZEPAM 1 MG PO TABS
1.0000 mg | ORAL_TABLET | Freq: Once | ORAL | Status: AC
  Administered 2022-01-17: 1 mg via ORAL
  Filled 2022-01-16: qty 1

## 2022-01-16 MED ORDER — THIAMINE HCL 100 MG/ML IJ SOLN: 100.0000 mg | Freq: Once | INTRAMUSCULAR | Status: DC

## 2022-01-16 MED ORDER — ACETAMINOPHEN 325 MG PO TABS
650.0000 mg | ORAL_TABLET | Freq: Four times a day (QID) | ORAL | Status: DC | PRN
Start: 2022-01-16 — End: 2022-01-16
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
## 2022-01-16 NOTE — [facility] Group Notes (Signed)
LCSW Wellness Group Note   01/16/2022 1:00pm  Type of Group and Topic: Psychoeducational Group:  Wellness  Participation Level:  did not attend  Description of Group  Wellness group introduces the topic and its focus on developing healthy habits across the spectrum and its relationship to a decrease in hospital admissions.  Six areas of wellness are discussed: physical, social spiritual, intellectual, occupational, and emotional.  Patients are asked to consider their current wellness habits and to identify areas of wellness where they are interested and able to focus on improvements.    Therapeutic Goals Patients will understand components of wellness and how they can positively impact overall health.  Patients will identify areas of wellness where they have developed good habits. Patients will identify areas of wellness where they would like to make improvements.    Summary of Patient Progress     Therapeutic Modalities: Cognitive Behavioral Therapy Psychoeducation    Andrez Lieurance Jon, LCSW 

## 2022-01-16 NOTE — ED Notes (Signed)
Patient readmitted to unit after being transferred to ED last night.  Patient cleared and returned to [facility].  Patient is calm and cooperative with readmission process. Patient given breakfast and has returned to her room at this time.  She remains IVC'd .  Will monitor and provide support as needed.

## 2022-01-16 NOTE — ED Provider Notes (Signed)
[place] EMERGENCY DEPARTMENT Provider Note  CSN: [id number] Arrival date & time: 01/16/22 0018  Chief Complaint(s) No chief complaint on file.  HPI Heidi Pearson is a 46 y.o. 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  Chest pain is sharp shooting pains.  Pain radiating to her back.  No alleviating factors.  Worse with palpation.  No shortness of breath.  No coughing.  No nausea or vomiting.  Lower back pain radiates down to the left leg.  Came on around the same time as her chest.   Of note patient had a fall 2 days ago.  Had negative CT head and cervical spine.  There is no complaints of back pain at that time.  No fall since.  No bladder/bowel incontinence.  No lower extremity weakness or loss of sensation.  HPI  Past Medical History Past Medical History:  Diagnosis Date   ADD (attention deficit disorder)    Anxiety    Anxiety    Chronic knee pain    Depression    PTSD (post-traumatic stress disorder)    Patient Active Problem List   Diagnosis Date Noted   MDD (major depressive disorder), recurrent severe, without psychosis ([place]) 01/15/2022   Alcohol dependence ([place]) 12/12/2013   ADHD (attention deficit hyperactivity disorder) 10/09/2012   Panic attacks 10/08/2012   PTSD (post-traumatic stress disorder) 10/08/2012   MDD (major depressive disorder) 10/08/2012   Chronic knee pain    Anxiety    Home Medication(s) Prior to Admission medications   Medication Sig Start Date End Date Taking? Authorizing Provider  acetaminophen (TYLENOL) 500 MG tablet Take 500 mg by mouth every 6 (six) hours as needed for mild pain, moderate pain, fever or headache.    [provider]  amphetamine-dextroamphetamine (ADDERALL) 20 MG tablet Take 20 mg by mouth 2 (two) times daily.  11/11/21   [provider]  aspirin-acetaminophen-caffeine (EXCEDRIN MIGRAINE) [phone] MG tablet Take 1 tablet by mouth every 6 (six) hours as needed for headache or migraine.    [provider]  buprenorphine-naloxone (SUBOXONE) 8-2 mg SUBL SL tablet Place 1 tablet under the tongue 3 (three) times daily. 11/25/21   [provider]  buPROPion (WELLBUTRIN XL) 150 MG 24 hr tablet Take 150 mg by mouth daily. 10/09/21   [provider]  clonazePAM (KLONOPIN) 1 MG tablet Take 1 mg by mouth every 12 (twelve) hours as needed for anxiety. 12/28/21   [provider]  diphenhydrAMINE (BENADRYL) 25 MG tablet Take 25 mg by mouth every 6 (six) hours as needed for allergies.    [provider]  gabapentin (NEURONTIN) 100 MG capsule Take 100-200 mg by mouth at bedtime. 10/14/21   [provider]  ibuprofen (ADVIL) 200 MG tablet Take 800 mg by mouth every 6 (six) hours as needed for mild pain.    [provider]  VRAYLAR 1.5 MG capsule Take 1.5 mg by mouth daily. 10/09/21   [provider]  Allergies Morphine and related and Paxil [paroxetine]  Review of Systems Review of Systems As noted in HPI  Physical Exam Vital Signs  I have reviewed the triage vital signs BP 110/66   Pulse 62   Temp 98.2 F (36.8 C)   Resp 16   LMP 01/07/2022 (Exact Date)   SpO2 96%   Physical Exam Vitals reviewed.  Constitutional:      General: She is not in acute distress.    Appearance: She is well-developed. She is not diaphoretic.  HENT:     Head: Normocephalic and atraumatic.     Nose: Nose normal.  Eyes:     General: No scleral icterus.       Right eye: No discharge.        Left eye: No discharge.     Conjunctiva/sclera: Conjunctivae normal.     Pupils: Pupils are equal, round, and reactive to light.   Cardiovascular:     Rate and Rhythm: Normal rate and regular rhythm.     Heart sounds: No murmur heard.    No friction rub. No gallop.  Pulmonary:     Effort: Pulmonary effort is normal. No respiratory distress.     Breath sounds: Normal breath sounds. No stridor. No rales.  Chest:     Chest wall: Tenderness present.    Abdominal:     General: There is no distension.     Palpations: Abdomen is soft.     Tenderness: There is no abdominal tenderness.  Musculoskeletal:        General: No tenderness.     Cervical back: Normal range of motion and neck supple. No bony tenderness.     Thoracic back: No bony tenderness.     Lumbar back: Spasms present. No bony tenderness.       Back:  Skin:    General: Skin is warm and dry.     Findings: No erythema or rash.  Neurological:     Mental Status: She is alert and oriented to person, place, and time.     ED Results and Treatments Labs (all labs ordered are listed, but only abnormal results are displayed) Labs Reviewed - No data to display                                                                                                                       EKG  EKG Interpretation  Date/Time:  Monday January 16 2022 03:34:34 EDT Ventricular Rate:  66 PR Interval:  190 QRS Duration: 92 QT Interval:  402 QTC Calculation: 421 R Axis:   70 Text Interpretation: Normal sinus rhythm Low voltage QRS No significant change was found When compared with ECG of 14-Jan-2022 01:01, PREVIOUS ECG IS PRESENT Confirmed by Drema Pry [phone]) on 01/16/2022 3:50:32 AM       Radiology CT Chest Wo Contrast  Result Date: 01/16/2022 CLINICAL DATA:  Chest wall pain, abnormal chest x-ray EXAM: CT CHEST WITHOUT CONTRAST TECHNIQUE: Multidetector CT imaging of the chest  was performed following the standard protocol without IV contrast. RADIATION DOSE REDUCTION: This exam was performed according to the departmental dose-optimization program which includes  automated exposure control, adjustment of the mA and/or kV according to patient size and/or use of iterative reconstruction technique. COMPARISON:  None Available. FINDINGS: Cardiovascular: No significant vascular findings. Normal heart size. No pericardial effusion. Mediastinum/Nodes: No enlarged mediastinal or axillary lymph nodes. Thyroid gland, trachea, and esophagus demonstrate no significant findings. Lungs/Pleura: Scattered ground-glass opacity within the lungs bilaterally demonstrating a dependent gradient likely reflects atelectasis. No pneumothorax or pleural effusion. Central airways are widely patent. Upper Abdomen: Moderate hepatic steatosis.  No acute abnormality. Musculoskeletal: No chest wall mass or suspicious bone lesions identified. IMPRESSION: 1. No acute intrathoracic pathology identified. 2. Moderate hepatic steatosis. Electronically Signed   By: Helyn Numbers M.D.   On: 01/16/2022 02:43   DG Chest 2 View  Result Date: 01/16/2022 CLINICAL DATA:  92569.  Encounter for chest wall pain EXAM: CHEST - 2 VIEW COMPARISON:  Chest x-ray 11/04/2015 FINDINGS: The heart and mediastinal contours are within normal limits. Query developing retrocardiac airspace opacity on lateral view. No pulmonary edema. No pleural effusion. No pneumothorax. No acute osseous abnormality. IMPRESSION: Query developing retrocardiac airspace opacity on lateral view. Electronically Signed   By: Tish Frederickson M.D.   On: 01/16/2022 01:57    Medications Ordered in ED Medications  ketorolac (TORADOL) 15 MG/ML injection 15 mg (15 mg Intravenous Given 01/16/22 0208)  methocarbamol (ROBAXIN) tablet 1,000 mg (1,000 mg Oral Given 01/16/22 0408)                                                                                                                                     Procedures Procedures  (including critical care time)  Medical Decision Making / ED Course   Medical Decision Making Amount and/or Complexity of Data  Reviewed Radiology: ordered and independent interpretation performed. Decision-making details documented in ED Course. ECG/medicine tests: ordered and independent interpretation performed. Decision-making details documented in ED Course.  Risk Prescription drug management.    Highly atypical pain inconsistent with ACS. EKG without acute ischemic changes or evidence of pericarditis.  Do not believe patient requires additional work-up for ACS. Presentation not classic for arctic dissection or esophageal perforation. Doubt PE.  Chest x-ray obtained to assess for any pneumonia, pneumothorax.  Retrocardiac haziness noted.  CT scan obtained and negative for pneumonia.  No acute injuries either.  Patient provided with Toradol.  Lower back pain is consistent with muscular strain/spasm.  Low suspicion for any bony injury from the fall given the timing.  Doubt cauda equina.  No emergent need for imaging at this time.        Final Clinical Impression(s) / ED Diagnoses Final diagnoses:  Chest wall pain  Acute left-sided low back pain with left-sided sciatica   The patient appears reasonably screened and/or stabilized for discharge and I doubt any  other medical condition or other [facility] requiring further screening, evaluation, or treatment in the ED at this time. I have discussed the findings, Dx and Tx plan with the patient/family who expressed understanding and agree(s) with the plan. Discharge instructions discussed at length. The patient/family was given strict return precautions who verbalized understanding of the instructions. No further questions at time of discharge.  Disposition: Discharge  Condition: Good  ED Discharge Orders     None       Patient discharged back to behavioral health.          This chart was dictated using voice recognition software.  Despite best efforts to proofread,  errors can occur which can change the documentation meaning.    Fatima Blank, MD 01/16/22 [phone]

## 2022-01-16 NOTE — Progress Notes (Signed)
Patient resting in bed.  Refused to attend social work meeting despite encouragement to do so.

## 2022-01-16 NOTE — Progress Notes (Signed)
Patient complained of indigestion.  Maalox given.  Patient also reports she does not want gabapentin in the day and is going to refuse next dose due at 4pm.  MD made aware.

## 2022-01-16 NOTE — ED Notes (Signed)
Patient attending AA meeting and appears attentive.

## 2022-01-16 NOTE — Progress Notes (Signed)
Patient complained of a headache number 6.  650mg  tylenol given.  Awaiting result.

## 2022-01-16 NOTE — ED Triage Notes (Signed)
Pt via [facility] from [facility] for eval of 4/10 CP/pressure, radiates to back & LLE. Hx anxiety, detoxing from suboxone  20RAX 4mg  zofran   130/90 HR 94 A&O4

## 2022-01-16 NOTE — Progress Notes (Signed)
Pt requested to see the provider on duty and the provider R. Mayford Knife came per her request and spoke with her.  He offered her additional melatonin to help her sleep and she refused it.

## 2022-01-16 NOTE — Progress Notes (Signed)
Patient complained of diarrhea and was given immodium.

## 2022-01-16 NOTE — Progress Notes (Signed)
Patient spent the first two hours of night shift in the day room with her peers.  She spent all of that time talking and laughing.  She complains that she hasn't slept in "days and days" and that she needs medication for sleep.  She was informed that she is receiving melatonin for sleep.  Patient displays no signs/symptoms of alcohol, benzodiazapine, nor narcotic withdrawals.

## 2022-01-16 NOTE — ED Provider Notes (Signed)
NP was notified that patient wanted to speak with a provider, having spoken with the patient patient keeps stating that she can sleep however patient's RN did give patient night medicines that include sleeping medicines.  NP offered patient an extra dose of melatonin patient stated if not going to work, patient stated I just not myself about them.  Discussed with patient that melatonin is available to her if she needed it because patient was already given hydroxyzine and melatonin.

## 2022-01-16 NOTE — Progress Notes (Signed)
Patient refused wellbutrin.  States "I don't want any anti depressants."  MD made aware.

## 2022-01-16 NOTE — ED Notes (Signed)
Patient refused to attend group with Child psychotherapist, she wanted to sleep instead.

## 2022-01-17 DIAGNOSIS — R45851 Suicidal ideations: Secondary | ICD-10-CM | POA: Diagnosis not present

## 2022-01-17 DIAGNOSIS — F4310 Post-traumatic stress disorder, unspecified: Secondary | ICD-10-CM | POA: Diagnosis not present

## 2022-01-17 DIAGNOSIS — F909 Attention-deficit hyperactivity disorder, unspecified type: Secondary | ICD-10-CM | POA: Diagnosis not present

## 2022-01-17 DIAGNOSIS — F332 Major depressive disorder, recurrent severe without psychotic features: Secondary | ICD-10-CM | POA: Diagnosis not present

## 2022-01-17 LAB — POC SARS CORONAVIRUS 2 AG: SARSCOV2ONAVIRUS 2 AG: NEGATIVE

## 2022-01-17 MED ORDER — DIPHENHYDRAMINE HCL 25 MG PO CAPS
25.0000 mg | ORAL_CAPSULE | Freq: Once | ORAL | Status: DC
  Filled 2022-01-17: qty 1

## 2022-01-17 MED ORDER — OLANZAPINE 5 MG PO TBDP: 5.0000 mg | ORAL_TABLET | Freq: Three times a day (TID) | ORAL | Status: DC | PRN

## 2022-01-17 MED ORDER — LORAZEPAM 1 MG PO TABS: 1.0000 mg | ORAL_TABLET | ORAL | Status: DC | PRN

## 2022-01-17 MED ORDER — TRAZODONE HCL 50 MG PO TABS
50.0000 mg | ORAL_TABLET | Freq: Once | ORAL | Status: AC
  Administered 2022-01-17: 50 mg via ORAL
  Filled 2022-01-17: qty 1

## 2022-01-17 MED ORDER — ZIPRASIDONE MESYLATE 20 MG IM SOLR
20.0000 mg | INTRAMUSCULAR | Status: AC | PRN
  Administered 2022-01-17: 20 mg via INTRAMUSCULAR
  Filled 2022-01-17: qty 20

## 2022-01-17 NOTE — ED Notes (Signed)
Pt  remains agitated, refusing care, slamming her hand on the wall, requesting to go to the ED.

## 2022-01-17 NOTE — ED Notes (Signed)
Pt complaining of pain in the right hand. Pt had strong radial pulse . Capillary refil less than 3secs. Skin color pink. Full ROM noted. Pt offered ice pack refused. Nurse practitioner Sindy Guadeloupe notified. Will continue to monitor for safety.

## 2022-01-17 NOTE — ED Notes (Signed)
Provider  Sindy Guadeloupe came to see patient.

## 2022-01-17 NOTE — ED Notes (Signed)
Patient refused medication ordered by provider.

## 2022-01-17 NOTE — ED Notes (Signed)
Pt took a shower, comfort measures given.  Resting at present.  Monitoring for safety.

## 2022-01-17 NOTE — ED Provider Notes (Signed)
Patient has increased agitation and had to be transferred from [facility] to observation where she could be better observed by one-to-one staff.  Patient continued to be agitated patient was given Geodon.  After giving the Geodon patient still agitated pacing back and forth.  At 1 point patient complained of her arm offered patient pain medicine or Benadryl patient refused.  Patient keeps saying she wanted to go to the bathroom constantly.  At this time patient continued to display malingering behaviors.  Nursing staff to continue one-to-one observation of patient.

## 2022-01-17 NOTE — ED Provider Notes (Signed)
FBC/OBS ASAP Discharge Summary  Date and Time: 01/17/2022 4:14 PM  Name: Heidi Pearson  MRN:  [id number]   Discharge Diagnoses:  Final diagnoses:  MDD (major depressive disorder), recurrent severe, without psychosis (HCC)    Subjective: Heidi Pearson is a 46 yr old female who presented to [facility] voluntarily on 9/1 with depression and SI, after coming onto the unit she wanted to discharge so was placed under IVC and awaited inpatient placement, she was sent to [facility] the evening of 9/1 after passing out and hitting her head and was cleared, she was readmitted to [facility] on 9/2, on 9/3 she no longer had active SI and was admitted to [facility], she was sent the evening of 9/3 to [facility] for severe chest/back pain and was cleared to be sent back to [facility], she was admitted to [facility] on 9/4.  PPHx significant for Anxiety, Panic Attacks, PTSD, MDD, ADHD, and alcohol dependence, and 1 previous suicide attempt (Cut wrists 8 yrs ago).  Heidi Pearson, 46 y.o., female patient seen face to face by this provider, consulted with Dr. Lucianne Muss; and chart reviewed on 01/17/22.    Upon assessment patient is alert/oriented x4 and cooperative.  She has normal speech. She is tearful and discusses her remorse about her actions throughout the night.  Her eyes are red and puffy.  Per nursing notes patient became extremely anxious and started banging her head on the wall.  At that time she was transferred from the facility base crisis unit to the continuous assessment unit for observation.  She was given IM Geodon at that time and slept throughout the night.  States, "I do not know what come over me I think it is the withdrawal from the ativan".  She feels her depression and anxiety is worsening and that she is not getting better.  She endorses feelings of hopelessness.  She endorses passive suicidal ideations at this time  which is in contrast to her denying SI during her time on the unit.  She is unable to contract for safety if she were to  be discharged home.  States, "I am afraid to be left alone with my thoughts".  She now says that her plan on discharge is to go live with friends.  She is no longer talking about living with her son in [place].  She denies HI/AVH.  She is able to converse coherently, has no distractibility or preoccupation.  She is able to answer questions appropriately.   Stay Summary:   When discussing plan of care for patient she is unable to identify anything that would make her feel better at this time.  Discussed case with Dr. Lucianne Muss.  Patient has been on the unit for 5 days.  She appeared to be improving but has regressed and due to patient's increase in depression, the return of her suicidal ideations and her risk factors that include the loss of her son 6 years ago, recent loss of her romantic relationship, and the death of her father 3 months ago she needs a higher level of care.  She will be recommended for inpatient psychiatric admission.   Patient has been accepted at [facility].    Total Time spent with patient: 30 minutes  Past Psychiatric History: See H&P Past Medical History:  Past Medical History:  Diagnosis Date   ADD (attention deficit disorder)    Anxiety    Anxiety    Chronic knee pain    Depression    PTSD (post-traumatic stress  disorder)     Past Surgical History:  Procedure Laterality Date   CESAREAN SECTION     Family History:  Family History  Problem Relation Age of Onset   Hypertension Mother    Cancer Mother    Hypertension Father    Family Psychiatric History: See H&P Social History:  Social History   Substance and Sexual Activity  Alcohol Use Yes   Comment: socially      Social History   Substance and Sexual Activity  Drug Use No   Types: Oxycodone, Hydrocodone   Comment: Quit 04/11/2012 herion two years ago    Social History   Socioeconomic History   Marital status: Divorced    Spouse name: Not on file   Number of children: Not on file   Years of  education: Not on file   Highest education level: Not on file  Occupational History   Not on file  Tobacco Use   Smoking status: Never   Smokeless tobacco: Never  Vaping Use   Vaping Use: Never used  Substance and Sexual Activity   Alcohol use: Yes    Comment: socially    Drug use: No    Types: Oxycodone, Hydrocodone    Comment: Quit 04/11/2012 herion two years ago   Sexual activity: Not Currently    Birth control/protection: None  Other Topics Concern   Not on file  Social History Narrative   Not on file   Social Determinants of Health   Financial Resource Strain: Not on file  Food Insecurity: Not on file  Transportation Needs: Not on file  Physical Activity: Not on file  Stress: Not on file  Social Connections: Not on file   SDOH:  SDOH Screenings   Depression (PHQ2-9): High Risk (01/16/2022)  Tobacco Use: Low Risk  (01/16/2022)    Tobacco Cessation:  N/A, patient does not currently use tobacco products  Current Medications:  Current Facility-Administered Medications  Medication Dose Route Frequency Provider Last Rate Last Admin   acetaminophen (TYLENOL) tablet 650 mg  650 mg Oral Q6H PRN Lauro FranklinPashayan, Alexander S, MD   650 mg at 01/16/22 1740   alum & mag hydroxide-simeth (MAALOX/MYLANTA) 200-200-20 MG/5ML suspension 15 mL  15 mL Oral Q6H PRN Lauro FranklinPashayan, Alexander S, MD   15 mL at 01/16/22 1457   buPROPion (WELLBUTRIN XL) 24 hr tablet 150 mg  150 mg Oral Daily Pashayan, Mardelle MatteAlexander S, MD       dicyclomine (BENTYL) capsule 20 mg  20 mg Oral Q6H PRN Lauro FranklinPashayan, Alexander S, MD   20 mg at 01/16/22 2121   diphenhydrAMINE (BENADRYL) capsule 25 mg  25 mg Oral Once Sindy GuadeloupeWilliams, Roy, NP       gabapentin (NEURONTIN) capsule 300 mg  300 mg Oral TID Lauro FranklinPashayan, Alexander S, MD   300 mg at 01/17/22 1231   hydrOXYzine (ATARAX) tablet 25 mg  25 mg Oral Q6H PRN Lauro FranklinPashayan, Alexander S, MD   25 mg at 01/16/22 2123   loperamide (IMODIUM) capsule 2-4 mg  2-4 mg Oral PRN Lauro FranklinPashayan, Alexander S, MD   4 mg  at 01/17/22 1428   LORazepam (ATIVAN) tablet 1 mg  1 mg Oral Q6H PRN Lauro FranklinPashayan, Alexander S, MD   1 mg at 01/17/22 0237   OLANZapine zydis (ZYPREXA) disintegrating tablet 5 mg  5 mg Oral Q8H PRN Sindy GuadeloupeWilliams, Roy, NP       And   LORazepam (ATIVAN) tablet 1 mg  1 mg Oral PRN Sindy GuadeloupeWilliams, Roy, NP  melatonin tablet 3 mg  3 mg Oral QHS Lauro Franklin, MD   3 mg at 01/16/22 2121   methocarbamol (ROBAXIN) tablet 500 mg  500 mg Oral Q8H PRN Lauro Franklin, MD       multivitamin with minerals tablet 1 tablet  1 tablet Oral Daily Lauro Franklin, MD   1 tablet at 01/17/22 1230   naproxen (NAPROSYN) tablet 500 mg  500 mg Oral BID PRN Lauro Franklin, MD       ondansetron (ZOFRAN-ODT) disintegrating tablet 4 mg  4 mg Oral Q6H PRN Lauro Franklin, MD   4 mg at 01/16/22 2119   thiamine (VITAMIN B1) tablet 100 mg  100 mg Oral Daily Lauro Franklin, MD   100 mg at 01/17/22 1232   Current Outpatient Medications  Medication Sig Dispense Refill   acetaminophen (TYLENOL) 500 MG tablet Take 500 mg by mouth every 6 (six) hours as needed for mild pain, moderate pain, fever or headache.     amphetamine-dextroamphetamine (ADDERALL) 20 MG tablet Take 20 mg by mouth 2 (two) times daily.     aspirin-acetaminophen-caffeine (EXCEDRIN MIGRAINE) 250-250-65 MG tablet Take 1 tablet by mouth every 6 (six) hours as needed for headache or migraine.     buprenorphine-naloxone (SUBOXONE) 8-2 mg SUBL SL tablet Place 1 tablet under the tongue 3 (three) times daily.     buPROPion (WELLBUTRIN XL) 150 MG 24 hr tablet Take 150 mg by mouth daily.     clonazePAM (KLONOPIN) 1 MG tablet Take 1 mg by mouth every 12 (twelve) hours as needed for anxiety.     diphenhydrAMINE (BENADRYL) 25 MG tablet Take 25 mg by mouth every 6 (six) hours as needed for allergies.     gabapentin (NEURONTIN) 100 MG capsule Take 100-200 mg by mouth at bedtime.     ibuprofen (ADVIL) 200 MG tablet Take 800 mg by mouth every 6 (six)  hours as needed for mild pain.     VRAYLAR 1.5 MG capsule Take 1.5 mg by mouth daily.      PTA Medications: (Not in a hospital admission)      01/16/2022   10:58 AM 01/15/2022   11:45 AM 01/13/2022   12:49 PM  Depression screen PHQ 2/9  Decreased Interest 1 2 2   Down, Depressed, Hopeless 2 1 2   PHQ - 2 Score 3 3 4   Altered sleeping 3 3 2   Tired, decreased energy 3 3 2   Change in appetite 3 3 2   Feeling bad or failure about yourself  2 3 2   Trouble concentrating 2 3 2   Moving slowly or fidgety/restless 0 2 2  Suicidal thoughts 1 1 2   PHQ-9 Score 17 21 18   Difficult doing work/chores Somewhat difficult Very difficult Very difficult    Flowsheet Row ED from 01/16/2022 in [facility] Most recent reading at 01/16/2022  8:37 AM ED from 01/16/2022 in [facility] EMERGENCY DEPARTMENT Most recent reading at 01/16/2022 12:57 AM ED from 01/14/2022 in [facility] Most recent reading at 01/15/2022  4:36 PM  C-SSRS RISK CATEGORY No Risk Error: Q3, 4, or 5 should not be populated when Q2 is No No Risk       Musculoskeletal  Strength & Muscle Tone: within normal limits Gait & Station: normal Patient leans: N/A  Psychiatric Specialty Exam  Presentation  General Appearance: Casual; Appropriate for Environment  Eye Contact:Good  Speech:Clear and Coherent; Normal Rate  Speech Volume:Normal  Handedness:Right   Mood and Affect  Mood:Anxious; Depressed; Hopeless  Affect:Tearful; Depressed; Congruent   Thought Process  Thought Processes:Coherent  Descriptions of Associations:Circumstantial  Orientation:Full (Time, Place and Person)  Thought Content:Logical  Diagnosis of Schizophrenia or Schizoaffective disorder in past: No    Hallucinations:Hallucinations: None  Ideas of Reference:None  Suicidal Thoughts:Suicidal Thoughts: Yes, Passive SI Passive Intent and/or Plan: Without Intent; Without Plan; Without Means  to Carry Out  Homicidal Thoughts:Homicidal Thoughts: No   Sensorium  Memory:Immediate Good; Recent Good; Remote Good  Judgment:Good  Insight:Good   Executive Functions  Concentration:Good  Attention Span:Good  Recall:Good  Fund of Knowledge:Good  Language:Good   Psychomotor Activity  Psychomotor Activity:Psychomotor Activity: Normal   Assets  Assets:Communication Skills; Desire for Improvement; Financial Resources/Insurance; Housing; Resilience; Physical Health; Social Support   Sleep  Sleep:Sleep: Poor   Nutritional Assessment (For OBS and FBC admissions only) Has the patient had a weight loss or gain of 10 pounds or more in the last 3 months?: No Has the patient had a decrease in food intake/or appetite?: No Does the patient have dental problems?: No Does the patient have eating habits or behaviors that may be indicators of an eating disorder including binging or inducing vomiting?: No Has the patient recently lost weight without trying?: 0 Has the patient been eating poorly because of a decreased appetite?: 0 Malnutrition Screening Tool Score: 0    Physical Exam  Physical Exam Vitals and nursing note reviewed.  Constitutional:      General: She is not in acute distress.    Appearance: Normal appearance. She is not ill-appearing.  HENT:     Head: Normocephalic.  Eyes:     General:        Right eye: No discharge.        Left eye: No discharge.     Conjunctiva/sclera: Conjunctivae normal.     Pupils: Pupils are equal, round, and reactive to light.  Cardiovascular:     Rate and Rhythm: Normal rate.  Pulmonary:     Effort: Pulmonary effort is normal. No respiratory distress.  Musculoskeletal:        General: Normal range of motion.     Cervical back: Normal range of motion.  Skin:    General: Skin is warm and dry.     Coloration: Skin is not jaundiced or pale.  Neurological:     Mental Status: She is alert and oriented to person, place, and time.   Psychiatric:        Attention and Perception: Attention and perception normal.        Mood and Affect: Mood is anxious and depressed. Affect is tearful.        Speech: Speech normal.        Behavior: Behavior is cooperative.        Thought Content: Thought content includes suicidal ideation. Thought content does not include suicidal plan.        Cognition and Memory: Cognition normal.        Judgment: Judgment is impulsive.    Review of Systems  Constitutional: Negative.   HENT: Negative.    Eyes: Negative.   Respiratory: Negative.    Cardiovascular: Negative.   Genitourinary: Negative.   Musculoskeletal: Negative.   Skin: Negative.   Neurological: Negative.   Psychiatric/Behavioral:  Positive for depression and suicidal ideas. The patient is nervous/anxious.    Blood pressure 128/77, pulse 82, temperature 98.7 F (37.1 C), temperature source Oral, resp. rate 16, last menstrual period  01/07/2022, SpO2 98 %. There is no height or weight on file to calculate BMI.   Disposition:   Discharge patient she will be transported to [facility] for inpatient psychiatric admission.  Dr. Toni Amend is the accepting physician.  Ardis Hughs, NP 01/17/2022, 4:14 PM

## 2022-01-17 NOTE — Progress Notes (Signed)
Patient received PRN Geodon in her L deltoid as ordered for agitation.  She had previously received her Ativan at bed time and refused all other PO medications.  Patient advised that we had to keep her safe from herself and others.  She demonstrated through her actions of making threats to bang her head, throwing items in her room and slamming doors that she was unable to control her emotions.    She was advised that she was being moved to continuing assessment in order to be more closely monitored.

## 2022-01-17 NOTE — Progress Notes (Signed)
Patient approached the nurse's station and asked if the provider had ordered her melatonin.  Patient was reminded that she had told the provider that she did not want the melatonin and would not take it.  She then went back to her bedroom.

## 2022-01-17 NOTE — ED Notes (Signed)
Pt is refused vitals.

## 2022-01-17 NOTE — ED Notes (Signed)
Pt was given chicken and veggies for dinner.  

## 2022-01-17 NOTE — ED Notes (Signed)
NP Sindy Guadeloupe at bedside to re-eval pt.

## 2022-01-17 NOTE — Progress Notes (Signed)
Received Heidi Pearson this AM asleep in her chair bed and continued to sleep  until 12noon. She woke up, ate lunch and talked with the provider. Afterwards she was medicated per order, but refused her Wellbutrin. She made several phone calls. She endorsed feeling anxious and depressed.

## 2022-01-17 NOTE — Discharge Instructions (Addendum)
Transfer patient to [facility] for IP psychiatric admission.

## 2022-01-17 NOTE — Progress Notes (Signed)
Pt was accepted to [facility] TODAY 01/17/22; Bed Assignment 312   Pt meets inpatient criteria per Vernard Gambles, NP   Attending Physician will be Dr. Toni Amend   Report can be called to: [phone]   Pt can arrive after: Next shift   Care Team notified: [facility] Rona Ravens, RN, Vernard Gambles, NP, Tyrell Antonio, RN, Jamse Belfast, RN, Mordecai Rasmussen, MD, Rex Kras, RN, and Gilford Rile, Counselor  Maryjean Ka, MSW, [facility] 01/17/2022 3:52 PM

## 2022-01-17 NOTE — ED Notes (Signed)
Heidi Pearson had clothes left on FBC unit in the washing machine when she was transferred to [facility] side. I put all her clothes in locker 21.

## 2022-01-17 NOTE — Progress Notes (Signed)
Patient began making threats to "bang her head" and began throwing things in her room and slamming doors.  Verbal escalation was unsuccessful.  Patient in need of higher level of care and had to be transferred to continuing assessment in order to be monitored more closely.

## 2022-01-17 NOTE — ED Notes (Signed)
Report was called to nurse Susy Frizzle at [facility] and afterwards the sheriff department for transport. A voice message was left with the sheriff department.

## 2022-01-17 NOTE — ED Notes (Signed)
Patient being asked many time to used the bathroom.

## 2022-01-17 NOTE — Progress Notes (Signed)
Pt continued to be visible on unit, making phone calls and several requests.

## 2022-01-18 ENCOUNTER — Inpatient Hospital Stay
Admission: AD | Admit: 2022-01-18 | Discharge: 2022-01-25 | DRG: 882 | Disposition: A | Discharge status: Home / Self Care | Payer: 59 | Source: Intra-hospital | Attending: Psychiatry | Admitting: Psychiatry

## 2022-01-18 ENCOUNTER — Encounter: Payer: Self-pay | Admitting: Psychiatry

## 2022-01-18 ENCOUNTER — Other Ambulatory Visit: Payer: Self-pay

## 2022-01-18 DIAGNOSIS — F419 Anxiety disorder, unspecified: Secondary | ICD-10-CM | POA: Diagnosis present

## 2022-01-18 DIAGNOSIS — F4323 Adjustment disorder with mixed anxiety and depressed mood: Principal | ICD-10-CM

## 2022-01-18 DIAGNOSIS — Z79899 Other long term (current) drug therapy: Secondary | ICD-10-CM | POA: Diagnosis not present

## 2022-01-18 DIAGNOSIS — F13239 Sedative, hypnotic or anxiolytic dependence with withdrawal, unspecified: Secondary | ICD-10-CM | POA: Diagnosis present

## 2022-01-18 DIAGNOSIS — Z8249 Family history of ischemic heart disease and other diseases of the circulatory system: Secondary | ICD-10-CM | POA: Diagnosis not present

## 2022-01-18 DIAGNOSIS — F13939 Sedative, hypnotic or anxiolytic use, unspecified with withdrawal, unspecified: Secondary | ICD-10-CM

## 2022-01-18 DIAGNOSIS — F332 Major depressive disorder, recurrent severe without psychotic features: Secondary | ICD-10-CM | POA: Diagnosis present

## 2022-01-18 DIAGNOSIS — R45851 Suicidal ideations: Secondary | ICD-10-CM | POA: Diagnosis present

## 2022-01-18 DIAGNOSIS — F1193 Opioid use, unspecified with withdrawal: Secondary | ICD-10-CM | POA: Diagnosis not present

## 2022-01-18 DIAGNOSIS — Z9152 Personal history of nonsuicidal self-harm: Secondary | ICD-10-CM | POA: Diagnosis not present

## 2022-01-18 DIAGNOSIS — Z809 Family history of malignant neoplasm, unspecified: Secondary | ICD-10-CM

## 2022-01-18 DIAGNOSIS — G4700 Insomnia, unspecified: Secondary | ICD-10-CM | POA: Diagnosis present

## 2022-01-18 DIAGNOSIS — F4310 Post-traumatic stress disorder, unspecified: Secondary | ICD-10-CM | POA: Diagnosis present

## 2022-01-18 DIAGNOSIS — Z20822 Contact with and (suspected) exposure to covid-19: Secondary | ICD-10-CM | POA: Diagnosis present

## 2022-01-18 DIAGNOSIS — F1123 Opioid dependence with withdrawal: Secondary | ICD-10-CM | POA: Diagnosis present

## 2022-01-18 DIAGNOSIS — F909 Attention-deficit hyperactivity disorder, unspecified type: Secondary | ICD-10-CM | POA: Diagnosis not present

## 2022-01-18 MED ORDER — ALUM & MAG HYDROXIDE-SIMETH 200-200-20 MG/5ML PO SUSP
30.0000 mL | ORAL | Status: DC | PRN
  Administered 2022-01-19 – 2022-01-24 (×5): 30 mL via ORAL
  Filled 2022-01-18 (×5): qty 30

## 2022-01-18 MED ORDER — ACETAMINOPHEN 325 MG PO TABS
650.0000 mg | ORAL_TABLET | Freq: Four times a day (QID) | ORAL | Status: DC | PRN
  Administered 2022-01-23 – 2022-01-25 (×3): 650 mg via ORAL
  Filled 2022-01-18 (×3): qty 2

## 2022-01-18 MED ORDER — METHOCARBAMOL 500 MG PO TABS
500.0000 mg | ORAL_TABLET | Freq: Three times a day (TID) | ORAL | Status: DC | PRN
  Administered 2022-01-18 – 2022-01-24 (×8): 500 mg via ORAL
  Filled 2022-01-18 (×8): qty 1

## 2022-01-18 MED ORDER — LOPERAMIDE HCL 2 MG PO CAPS: 2.0000 mg | ORAL_CAPSULE | ORAL | Status: AC | PRN

## 2022-01-18 MED ORDER — HYDROXYZINE HCL 25 MG PO TABS
25.0000 mg | ORAL_TABLET | Freq: Four times a day (QID) | ORAL | Status: DC | PRN
  Administered 2022-01-18 – 2022-01-19 (×4): 25 mg via ORAL
  Filled 2022-01-18 (×4): qty 1

## 2022-01-18 MED ORDER — LORAZEPAM 1 MG PO TABS
1.0000 mg | ORAL_TABLET | ORAL | Status: AC | PRN
  Administered 2022-01-20: 1 mg via ORAL
  Filled 2022-01-18: qty 1

## 2022-01-18 MED ORDER — MELATONIN 5 MG PO TABS
2.5000 mg | ORAL_TABLET | Freq: Every day | ORAL | Status: DC
  Administered 2022-01-18 – 2022-01-24 (×7): 2.5 mg via ORAL
  Filled 2022-01-18 (×7): qty 1

## 2022-01-18 MED ORDER — MAGNESIUM HYDROXIDE 400 MG/5ML PO SUSP: 30.0000 mL | Freq: Every day | ORAL | Status: DC | PRN

## 2022-01-18 MED ORDER — OLANZAPINE 5 MG PO TBDP
5.0000 mg | ORAL_TABLET | Freq: Three times a day (TID) | ORAL | Status: DC | PRN
  Administered 2022-01-20 – 2022-01-25 (×11): 5 mg via ORAL
  Filled 2022-01-18 (×13): qty 1

## 2022-01-18 MED ORDER — NAPROXEN 500 MG PO TABS
500.0000 mg | ORAL_TABLET | Freq: Two times a day (BID) | ORAL | Status: DC | PRN
  Administered 2022-01-18 – 2022-01-25 (×8): 500 mg via ORAL
  Filled 2022-01-18 (×10): qty 1

## 2022-01-18 MED ORDER — ONDANSETRON 4 MG PO TBDP
4.0000 mg | ORAL_TABLET | Freq: Four times a day (QID) | ORAL | Status: AC | PRN
Start: 2022-01-18 — End: 2022-01-20
  Administered 2022-01-19 (×2): 4 mg via ORAL
  Filled 2022-01-18 (×2): qty 1

## 2022-01-18 MED ORDER — DICYCLOMINE HCL 10 MG PO CAPS
20.0000 mg | ORAL_CAPSULE | Freq: Four times a day (QID) | ORAL | Status: DC | PRN
  Administered 2022-01-19 – 2022-01-24 (×6): 20 mg via ORAL
  Filled 2022-01-18 (×7): qty 2

## 2022-01-18 MED ORDER — THIAMINE MONONITRATE 100 MG PO TABS
100.0000 mg | ORAL_TABLET | Freq: Every day | ORAL | Status: DC
  Administered 2022-01-18: 100 mg via ORAL
  Filled 2022-01-18 (×2): qty 1

## 2022-01-18 MED ORDER — GABAPENTIN 300 MG PO CAPS
300.0000 mg | ORAL_CAPSULE | Freq: Three times a day (TID) | ORAL | Status: DC
  Administered 2022-01-18 – 2022-01-21 (×9): 300 mg via ORAL
  Filled 2022-01-18 (×9): qty 1

## 2022-01-18 MED ORDER — QUETIAPINE FUMARATE 25 MG PO TABS
50.0000 mg | ORAL_TABLET | Freq: Every day | ORAL | Status: DC
  Administered 2022-01-18 – 2022-01-20 (×3): 50 mg via ORAL
  Filled 2022-01-18 (×3): qty 2

## 2022-01-18 MED ORDER — BUPROPION HCL ER (XL) 150 MG PO TB24
150.0000 mg | ORAL_TABLET | Freq: Every day | ORAL | Status: DC
  Filled 2022-01-18: qty 1

## 2022-01-18 MED ORDER — ADULT MULTIVITAMIN W/MINERALS CH
1.0000 | ORAL_TABLET | Freq: Every day | ORAL | Status: DC
  Administered 2022-01-18: 1 via ORAL
  Filled 2022-01-18 (×2): qty 1

## 2022-01-18 NOTE — ED Notes (Signed)
Heidi Pearson washed her clothes and gave them back.

## 2022-01-18 NOTE — ED Notes (Signed)
Patient is still in her recliner, breathing is even and unlabored.   Patient continues to sleep this shift after her shower earlier.  She reported to staff that she is excited to be leaving and going to a place to have groups.  Patient approached this writer earlier in the shift and apologized for her behavior the previous night.

## 2022-01-18 NOTE — ED Notes (Signed)
Pt Aox4, reports headache this mornings due to trazodone medication she took for sleep.  Pt reports this medication commonly gives her a headache in the morning.  Pt reports good sleep over night saying she slept the best since being in our facility.  Denies SI, HI, and AVH.  Breathing is even and unlabored.  Will continue to monitor for safety.

## 2022-01-18 NOTE — BHH Suicide Risk Assessment (Signed)
[facility] Admission Suicide Risk Assessment   Nursing information obtained from:  Patient Demographic factors:  Caucasian, Divorced or widowed, Living alone Current Mental Status:  NA Loss Factors:  Loss of significant relationship Historical Factors:  NA Risk Reduction Factors:  Positive social support  Total Time spent with patient: 45 minutes Principal Problem: Adjustment disorder with mixed anxiety and depressed mood Diagnosis:  Principal Problem:   Adjustment disorder with mixed anxiety and depressed mood Active Problems:   Anxiety   Benzodiazepine withdrawal (HCC)   Opiate withdrawal (HCC)  Subjective Data: Patient seen and chart reviewed.  46 year old woman with a history of anxiety and substance use disorder.  Recently had suicidal thoughts without intent or plan but now denies any suicidal ideation.  Mostly interested in relief from somatic symptoms and anxiety such that she will feel comfortable moving to outpatient treatment.  Cooperative with treatment and no evidence of psychosis.  Continued Clinical Symptoms:  Alcohol Use Disorder Identification Test Final Score (AUDIT): 1 The "Alcohol Use Disorders Identification Test", Guidelines for Use in Primary Care, Second Edition.  World Science writer [facility]). Score between 0-7:  no or low risk or alcohol related problems. Score between 8-15:  moderate risk of alcohol related problems. Score between 16-19:  high risk of alcohol related problems. Score 20 or above:  warrants further diagnostic evaluation for alcohol dependence and treatment.   CLINICAL FACTORS:   Severe Anxiety and/or Agitation Alcohol/Substance Abuse/Dependencies   Musculoskeletal: Strength & Muscle Tone: within normal limits Gait & Station: normal Patient leans: N/A  Psychiatric Specialty Exam:  Presentation  General Appearance: Casual; Appropriate for Environment  Eye Contact:Good  Speech:Clear and Coherent; Normal Rate  Speech  Volume:Normal  Handedness:Right   Mood and Affect  Mood:Anxious; Depressed; Hopeless  Affect:Tearful; Depressed; Congruent   Thought Process  Thought Processes:Coherent  Descriptions of Associations:Circumstantial  Orientation:Full (Time, Place and Person)  Thought Content:Logical  History of Schizophrenia/Schizoaffective disorder:No  Duration of Psychotic Symptoms:No data recorded Hallucinations:Hallucinations: None  Ideas of Reference:None  Suicidal Thoughts:Suicidal Thoughts: Yes, Passive SI Passive Intent and/or Plan: Without Intent; Without Plan; Without Means to Carry Out  Homicidal Thoughts:Homicidal Thoughts: No   Sensorium  Memory:Immediate Good; Recent Good; Remote Good  Judgment:Good  Insight:Good   Executive Functions  Concentration:Good  Attention Span:Good  Recall:Good  Fund of Knowledge:Good  Language:Good   Psychomotor Activity  Psychomotor Activity:Psychomotor Activity: Normal   Assets  Assets:Communication Skills; Desire for Improvement; Financial Resources/Insurance; Housing; Resilience; Physical Health; Social Support   Sleep  Sleep:Sleep: Poor    Physical Exam: Physical Exam Vitals and nursing note reviewed.  Constitutional:      Appearance: Normal appearance.  HENT:     Head: Normocephalic and atraumatic.     Mouth/Throat:     Pharynx: Oropharynx is clear.  Eyes:     Pupils: Pupils are equal, round, and reactive to light.  Cardiovascular:     Rate and Rhythm: Normal rate and regular rhythm.  Pulmonary:     Effort: Pulmonary effort is normal.     Breath sounds: Normal breath sounds.  Abdominal:     General: Abdomen is flat.     Palpations: Abdomen is soft.  Musculoskeletal:        General: Normal range of motion.  Skin:    General: Skin is warm and dry.  Neurological:     General: No focal deficit present.     Mental Status: She is alert. Mental status is at baseline.  Psychiatric:  Attention and  Perception: Attention normal.        Mood and Affect: Mood is anxious.        Speech: Speech normal.        Behavior: Behavior is cooperative.        Thought Content: Thought content normal.        Cognition and Memory: Cognition normal.        Judgment: Judgment normal.    Review of Systems  Constitutional: Negative.   HENT: Negative.    Eyes: Negative.   Respiratory: Negative.    Cardiovascular: Negative.   Gastrointestinal: Negative.   Musculoskeletal: Negative.   Skin: Negative.   Neurological: Negative.   Psychiatric/Behavioral:  Positive for depression and substance abuse. Negative for hallucinations and suicidal ideas. The patient is nervous/anxious and has insomnia.    Blood pressure 132/78, pulse 93, temperature 99.2 F (37.3 C), resp. rate 16, height 5\' 3"  (1.6 m), weight 96.2 kg, last menstrual period 01/07/2022, SpO2 96 %. Body mass index is 37.55 kg/m.   COGNITIVE FEATURES THAT CONTRIBUTE TO RISK:  Polarized thinking    SUICIDE RISK:   Minimal: No identifiable suicidal ideation.  Patients presenting with no risk factors but with morbid ruminations; may be classified as minimal risk based on the severity of the depressive symptoms  PLAN OF CARE: Review medications.  Engage in individual and group therapy.  Daily assessment of dangerousness and symptomatology on the way to working on discharge planning  I certify that inpatient services furnished can reasonably be expected to improve the patient's condition.   01/09/2022, MD 01/18/2022, 6:49 PM

## 2022-01-18 NOTE — ED Notes (Signed)
Pt was given a muffin and juice for breakfast.  

## 2022-01-18 NOTE — Progress Notes (Signed)
Patient calm and pleasant during assessment denying SI/HI/AVH. Pt endorses anxiety. Pt observed interacting appropriately with staff and peers on the unit. Pt compliant with medication administration per MD orders. Pt given education, support, and encouragement to be active in his treatment plan. Pt being monitored Q 15 minutes for safety per unit protocol.

## 2022-01-18 NOTE — H&P (Signed)
Psychiatric Admission Assessment Adult  Patient Identification: Heidi Pearson MRN:  [id number] Date of Evaluation:  01/18/2022 Chief Complaint:  Severe recurrent major depression without psychotic features (HCC) [F33.2] Principal Diagnosis: Adjustment disorder with mixed anxiety and depressed mood Diagnosis:  Principal Problem:   Adjustment disorder with mixed anxiety and depressed mood Active Problems:   Anxiety   Benzodiazepine withdrawal (HCC)   Opiate withdrawal (HCC)  History of Present Illness: Patient seen and chart reviewed.  46 year old woman transferred to [place] from [facility] for ongoing treatment of anxiety and dysphoric mood in the context of substance withdrawal.  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  Several weeks ago she started to withdraw herself from those medicines but was having a lot of complications from withdrawal symptoms.  Became very anxious and had some suicidal thoughts without specific intent or plan.  Presented to [facility] seeking to continue that.  Has been gradually tapered off of medications although still complains of having some nausea and diarrhea and restlessness and difficulty sleeping at night.  Mood is very anxious.  Currently denies any suicidal thought intent or plan.  Not reporting any hallucinations. Associated Signs/Symptoms: Depression Symptoms:  depressed mood, insomnia, difficulty concentrating, Duration of Depression Symptoms: Greater than two weeks  (Hypo) Manic Symptoms:  Distractibility, Anxiety Symptoms:  Excessive Worry, Psychotic Symptoms:   None reported PTSD Symptoms: Negative Total Time spent with patient: 1 hour  Past Psychiatric History: Patient has a history of substance use problems including alcohol and opiates and benzodiazepines going back several years.  Has had some experience with substance abuse therapy  treatment.  No past suicide attempts but had a history of self-mutilation as a child.  Is the patient at risk to self? Yes.    Has the patient been a risk to self in the past 6 months? Yes.    Has the patient been a risk to self within the distant past? Yes.    Is the patient a risk to others? No.  Has the patient been a risk to others in the past 6 months? No.  Has the patient been a risk to others within the distant past? No.   [place] Scale:  Flowsheet Row Admission (Current) from 01/18/2022 in [facility] INPATIENT BEHAVIORAL MEDICINE Most recent reading at 01/18/2022  2:33 PM ED from 01/16/2022 in [facility] Most recent reading at 01/16/2022  8:37 AM ED from 01/16/2022 in [facility] EMERGENCY DEPARTMENT Most recent reading at 01/16/2022 12:57 AM  C-SSRS RISK CATEGORY Error: Q7 should not be populated when Q6 is No No Risk Error: Q3, 4, or 5 should not be populated when Q2 is No        Prior Inpatient Therapy:   Prior Outpatient Therapy:    Alcohol Screening: 1. How often do you have a drink containing alcohol?: Monthly or less 2. How many drinks containing alcohol do you have on a typical day when you are drinking?: 1 or 2 3. How often do you have six or more drinks on one occasion?: Never AUDIT-C Score: 1 4. How often during the last year have you found that you were not able to stop drinking once you had started?: Never 5. How often during the last year have you failed to do what was normally expected from you because of drinking?: Never 6. How often during the last year have you needed a  first drink in the morning to get yourself going after a heavy drinking session?: Never 7. How often during the last year have you had a feeling of guilt of remorse after drinking?: Never 8. How often during the last year have you been unable to remember what happened the night before because you had been drinking?: Never 9. Have you or someone else been injured as  a result of your drinking?: No 10. Has a relative or friend or a doctor or another health worker been concerned about your drinking or suggested you cut down?: No Alcohol Use Disorder Identification Test Final Score (AUDIT): 1 Alcohol Brief Interventions/Follow-up: Patient Refused Substance Abuse History in the last 12 months:  Yes.   Consequences of Substance Abuse: Withdrawal symptoms as well as contributing to chronic anxiety and mood problems Previous Psychotropic Medications: Yes  Psychological Evaluations: Yes  Past Medical History:  Past Medical History:  Diagnosis Date   ADD (attention deficit disorder)    Anxiety    Anxiety    Chronic knee pain    Depression    PTSD (post-traumatic stress disorder)     Past Surgical History:  Procedure Laterality Date   CESAREAN SECTION     Family History:  Family History  Problem Relation Age of Onset   Hypertension Mother    Cancer Mother    Hypertension Father    Family Psychiatric  History: Son died of overdose of opiates although she says she does not think he was a regular user Tobacco Screening:   Social History:  Social History   Substance and Sexual Activity  Alcohol Use Yes   Comment: socially      Social History   Substance and Sexual Activity  Drug Use No   Types: Oxycodone, Hydrocodone   Comment: Quit 04/11/2012 herion two years ago    Additional Social History:                           Allergies:   Allergies  Allergen Reactions   Morphine And Related Hives   Paxil [Paroxetine] Other (See Comments)    "I became manic"   Lab Results:  Results for orders placed or performed during the hospital encounter of 01/16/22 (from the past 48 hour(s))  POC SARS Coronavirus 2 Ag     Status: None   Collection Time: 01/17/22  2:06 PM  Result Value Ref Range   SARSCOV2ONAVIRUS 2 AG NEGATIVE NEGATIVE    Comment: (NOTE) SARS-CoV-2 antigen NOT DETECTED.   Negative results are presumptive.  Negative  results do not preclude SARS-CoV-2 infection and should not be used as the sole basis for treatment or other patient management decisions, including infection  control decisions, particularly in the presence of clinical signs and  symptoms consistent with COVID-19, or in those who have been in contact with the virus.  Negative results must be combined with clinical observations, patient history, and epidemiological information. The expected result is Negative.  Fact Sheet for Patients: https://www.jennings-kim.com/  Fact Sheet for Healthcare Providers: https://alexander-rogers.biz/  This test is not yet approved or cleared by the [place] FDA and  has been authorized for detection and/or diagnosis of SARS-CoV-2 by FDA under an Emergency Use Authorization (EUA).  This EUA will remain in effect (meaning this test can be used) for the duration of  the COV ID-19 declaration under Section 564(b)(1) of the Act, 21 U.S.C. section 360bbb-3(b)(1), unless the authorization is terminated or revoked sooner.  Blood Alcohol level:  Lab Results  Component Value Date   ETH <10 01/13/2022   ETH 96 (H) 12/11/2013    Metabolic Disorder Labs:  Lab Results  Component Value Date   HGBA1C 5.4 01/13/2022   MPG 108.28 01/13/2022   Lab Results  Component Value Date   PROLACTIN 22.8 01/13/2022   Lab Results  Component Value Date   CHOL 215 (H) 01/13/2022   TRIG 208 (H) 01/13/2022   HDL 44 01/13/2022   CHOLHDL 4.9 01/13/2022   VLDL 42 (H) 01/13/2022   LDLCALC 129 (H) 01/13/2022    Current Medications: Current Facility-Administered Medications  Medication Dose Route Frequency Provider Last Rate Last Admin   acetaminophen (TYLENOL) tablet 650 mg  650 mg Oral Q6H PRN Adasia Hoar T, MD       alum & mag hydroxide-simeth (MAALOX/MYLANTA) 200-200-20 MG/5ML suspension 30 mL  30 mL Oral Q4H PRN Mahogany Torrance, Jackquline Denmark, MD       dicyclomine (BENTYL) capsule 20 mg  20 mg  Oral Q6H PRN Aldan Camey, Jackquline Denmark, MD       gabapentin (NEURONTIN) capsule 300 mg  300 mg Oral TID Moua Rasmusson, Jackquline Denmark, MD   300 mg at 01/18/22 1825   hydrOXYzine (ATARAX) tablet 25 mg  25 mg Oral Q6H PRN Dolph Tavano, Jackquline Denmark, MD   25 mg at 01/18/22 1822   loperamide (IMODIUM) capsule 2-4 mg  2-4 mg Oral PRN Nayelly Laughman, Jackquline Denmark, MD       OLANZapine zydis (ZYPREXA) disintegrating tablet 5 mg  5 mg Oral Q8H PRN Paulita Licklider, Jackquline Denmark, MD       And   LORazepam (ATIVAN) tablet 1 mg  1 mg Oral PRN Bowen Kia, Jackquline Denmark, MD       magnesium hydroxide (MILK OF MAGNESIA) suspension 30 mL  30 mL Oral Daily PRN Samona Chihuahua T, MD       melatonin tablet 2.5 mg  2.5 mg Oral QHS Lexus Barletta, Jackquline Denmark, MD       methocarbamol (ROBAXIN) tablet 500 mg  500 mg Oral Q8H PRN Johncharles Fusselman, Jackquline Denmark, MD   500 mg at 01/18/22 1112   naproxen (NAPROSYN) tablet 500 mg  500 mg Oral BID PRN Jeremy Ditullio, Jackquline Denmark, MD   500 mg at 01/18/22 1317   ondansetron (ZOFRAN-ODT) disintegrating tablet 4 mg  4 mg Oral Q6H PRN Kiowa Hollar, Jackquline Denmark, MD       QUEtiapine (SEROQUEL) tablet 50 mg  50 mg Oral QHS Shiree Altemus, Jackquline Denmark, MD       PTA Medications: Medications Prior to Admission  Medication Sig Dispense Refill Last Dose   acetaminophen (TYLENOL) 500 MG tablet Take 500 mg by mouth every 6 (six) hours as needed for mild pain, moderate pain, fever or headache.      amphetamine-dextroamphetamine (ADDERALL) 20 MG tablet Take 20 mg by mouth 2 (two) times daily.      aspirin-acetaminophen-caffeine (EXCEDRIN MIGRAINE) 250-250-65 MG tablet Take 1 tablet by mouth every 6 (six) hours as needed for headache or migraine.      buprenorphine-naloxone (SUBOXONE) 8-2 mg SUBL SL tablet Place 1 tablet under the tongue 3 (three) times daily.      buPROPion (WELLBUTRIN XL) 150 MG 24 hr tablet Take 150 mg by mouth daily.      clonazePAM (KLONOPIN) 1 MG tablet Take 1 mg by mouth every 12 (twelve) hours as needed for anxiety.      diphenhydrAMINE (BENADRYL) 25 MG tablet Take 25 mg by mouth every 6 (six)  hours  as needed for allergies.      gabapentin (NEURONTIN) 100 MG capsule Take 100-200 mg by mouth at bedtime.      ibuprofen (ADVIL) 200 MG tablet Take 800 mg by mouth every 6 (six) hours as needed for mild pain.      VRAYLAR 1.5 MG capsule Take 1.5 mg by mouth daily.       Musculoskeletal: Strength & Muscle Tone: within normal limits Gait & Station: normal Patient leans: N/A            Psychiatric Specialty Exam:  Presentation  General Appearance: Casual; Appropriate for Environment  Eye Contact:Good  Speech:Clear and Coherent; Normal Rate  Speech Volume:Normal  Handedness:Right   Mood and Affect  Mood:Anxious; Depressed; Hopeless  Affect:Tearful; Depressed; Congruent   Thought Process  Thought Processes:Coherent  Duration of Psychotic Symptoms: No data recorded Past Diagnosis of Schizophrenia or Psychoactive disorder: No  Descriptions of Associations:Circumstantial  Orientation:Full (Time, Place and Person)  Thought Content:Logical  Hallucinations:Hallucinations: None  Ideas of Reference:None  Suicidal Thoughts:Suicidal Thoughts: Yes, Passive SI Passive Intent and/or Plan: Without Intent; Without Plan; Without Means to Carry Out  Homicidal Thoughts:Homicidal Thoughts: No   Sensorium  Memory:Immediate Good; Recent Good; Remote Good  Judgment:Good  Insight:Good   Executive Functions  Concentration:Good  Attention Span:Good  Recall:Good  Fund of Knowledge:Good  Language:Good   Psychomotor Activity  Psychomotor Activity:Psychomotor Activity: Normal   Assets  Assets:Communication Skills; Desire for Improvement; Financial Resources/Insurance; Housing; Resilience; Physical Health; Social Support   Sleep  Sleep:Sleep: Poor    Physical Exam: Physical Exam Vitals and nursing note reviewed.  Constitutional:      Appearance: Normal appearance.  HENT:     Head: Normocephalic and atraumatic.     Mouth/Throat:     Pharynx:  Oropharynx is clear.  Eyes:     Pupils: Pupils are equal, round, and reactive to light.  Cardiovascular:     Rate and Rhythm: Normal rate and regular rhythm.  Pulmonary:     Effort: Pulmonary effort is normal.     Breath sounds: Normal breath sounds.  Abdominal:     General: Abdomen is flat.     Palpations: Abdomen is soft.  Musculoskeletal:        General: Normal range of motion.  Skin:    General: Skin is warm and dry.  Neurological:     General: No focal deficit present.     Mental Status: She is alert. Mental status is at baseline.  Psychiatric:        Attention and Perception: Attention normal.        Mood and Affect: Mood is anxious.        Speech: Speech normal.        Behavior: Behavior normal.        Thought Content: Thought content normal.        Cognition and Memory: Cognition normal.    Review of Systems  Constitutional: Negative.   HENT: Negative.    Eyes: Negative.   Respiratory: Negative.    Cardiovascular: Negative.   Gastrointestinal: Negative.   Musculoskeletal: Negative.   Skin: Negative.   Neurological: Negative.   Psychiatric/Behavioral:  Positive for depression, hallucinations, substance abuse and suicidal ideas. The patient is nervous/anxious and has insomnia.    Blood pressure 132/78, pulse 93, temperature 99.2 F (37.3 C), resp. rate 16, height 5\' 3"  (1.6 m), weight 96.2 kg, last menstrual period 01/07/2022, SpO2 96 %. Body mass index is 37.55 kg/m.  Treatment  Plan Summary: Medication management and Plan continue current medication management.  Reviewed medicines for sleep.  Suggest adding Seroquel at this point as she cannot tolerate trazodone.  Engage in individual and group therapy ongoing assessment of dangerousness and symptomatology before discharge planning  Observation Level/Precautions:  15 minute checks  Laboratory:  Chemistry Profile  Psychotherapy:    Medications:    Consultations:    Discharge Concerns:    Estimated LOS:   Other:     Physician Treatment Plan for Primary Diagnosis: Adjustment disorder with mixed anxiety and depressed mood Long Term Goal(s): Improvement in symptoms so as ready for discharge  Short Term Goals: Ability to verbalize feelings will improve, Ability to disclose and discuss suicidal ideas, and Ability to demonstrate self-control will improve  Physician Treatment Plan for Secondary Diagnosis: Principal Problem:   Adjustment disorder with mixed anxiety and depressed mood Active Problems:   Anxiety   Benzodiazepine withdrawal (HCC)   Opiate withdrawal (HCC)  Long Term Goal(s): Improvement in symptoms so as ready for discharge  Short Term Goals: Ability to maintain clinical measurements within normal limits will improve, Compliance with prescribed medications will improve, and Ability to identify triggers associated with substance abuse/mental health issues will improve  I certify that inpatient services furnished can reasonably be expected to improve the patient's condition.    Mordecai Rasmussen, MD 9/6/20236:52 PM

## 2022-01-18 NOTE — Tx Team (Signed)
Initial Treatment Plan 01/18/2022 1:42 PM SETSUKO ROBINS DSK:[id number]    PATIENT STRESSORS: Substance abuse   Traumatic event     PATIENT STRENGTHS: Ability for insight  Average or above average intelligence  Capable of independent living  Personnel officer means  Physical Health  Supportive family/friends  Work skills    PATIENT IDENTIFIED PROBLEMS: Substance abuse  Lonliness                   DISCHARGE CRITERIA:  Ability to meet basic life and health needs Adequate post-discharge living arrangements Medical problems require only outpatient monitoring Motivation to continue treatment in a less acute level of care Safe-care adequate arrangements made Verbal commitment to aftercare and medication compliance  PRELIMINARY DISCHARGE PLAN: Attend aftercare/continuing care group Outpatient therapy Return to previous living arrangement  PATIENT/FAMILY INVOLVEMENT: This treatment plan has been presented to and reviewed with the patient, Heidi Pearson, and/or family member.  The patient and family have been given the opportunity to ask questions and make suggestions.  Leonarda Salon, RN 01/18/2022, 1:42 PM

## 2022-01-18 NOTE — [facility] Group Notes (Signed)
[facility] Group Notes:  (Nursing/MHT/Case Management/Adjunct)  Date:  01/18/2022  Time:  9:03 PM  Type of Therapy:   Wrap up  Participation Level:  Active  Participation Quality:  Appropriate  Affect:  Appropriate  Cognitive:  Alert  Insight:  Good  Engagement in Group:  Engaged and no goal  Modes of Intervention:  Support  Summary of Progress/Problems:  Heidi Pearson 01/18/2022, 9:03 PM

## 2022-01-18 NOTE — Progress Notes (Signed)
Patient admitted with major depression with no psychotic features. Patient sad and tearful on admission assessment. Patient states " I was addicted. I was abusing Suboxone and Klonopin." Patient states her goal is " be on the right medicines and go and stay with my son  in [place]." Patient lost her dad in June 2023 who was the supportive person in her life. Patient works as Sales executive for 22 years. Patient denies SI,HI and AVH at this time. Skin assessment and body search done with [facility] RN. No contraband found. Oriented to unit and made comfortable in room. Support and encouragement given.

## 2022-01-18 NOTE — ED Notes (Signed)
Sheriffs Engineer, building services RM 312.  Pt is IVC.

## 2022-01-18 NOTE — Progress Notes (Signed)
Recreation Therapy Notes    Date: 01/18/2022   Time: 10:20 am    Location: Court yard     Behavioral response: N/A   Intervention Topic: Decision Making    Discussion/Intervention: Patient refused to attend group.    Clinical Observations/Feedback:  Patient refused to attend group.    Heidi Pearson LRT/CTRS        Heidi Pearson 01/18/2022 10:44 AM 

## 2022-01-18 NOTE — Group Note (Signed)
[facility] LCSW Group Therapy Note   Group Date: 01/18/2022 Start Time: 1300 End Time: 1400   Type of Therapy/Topic:  Group Therapy:  Emotion Regulation  Participation Level:  Did Not Attend    Description of Group:    The purpose of this group is to assist patients in learning to regulate negative emotions and experience positive emotions. Patients will be guided to discuss ways in which they have been vulnerable to their negative emotions. These vulnerabilities will be juxtaposed with experiences of positive emotions or situations, and patients challenged to use positive emotions to combat negative ones. Special emphasis will be placed on coping with negative emotions in conflict situations, and patients will process healthy conflict resolution skills.  Therapeutic Goals: Patient will identify two positive emotions or experiences to reflect on in order to balance out negative emotions:  Patient will label two or more emotions that they find the most difficult to experience:  Patient will be able to demonstrate positive conflict resolution skills through discussion or role plays:   Summary of Patient Progress: Group not held due to acuity.     Therapeutic Modalities:   Cognitive Behavioral Therapy Feelings Identification Dialectical Behavioral Therapy   Blythe Hartshorn R Laportia Carley, LCSW 

## 2022-01-19 DIAGNOSIS — F4323 Adjustment disorder with mixed anxiety and depressed mood: Secondary | ICD-10-CM | POA: Diagnosis not present

## 2022-01-19 LAB — LIPID PANEL
Cholesterol: 229 mg/dL — ABNORMAL HIGH (ref 0–200)
HDL: 43 mg/dL (ref >40)
LDL Cholesterol: 155 mg/dL — ABNORMAL HIGH (ref 0–99)
Total CHOL/HDL Ratio: 5.3 RATIO
Triglycerides: 154 mg/dL — ABNORMAL HIGH (ref <150)
VLDL: 31 mg/dL (ref 0–40)

## 2022-01-19 LAB — HEMOGLOBIN A1C
Hgb A1c MFr Bld: 5.4 % (ref 4.8–5.6)
Mean Plasma Glucose: 108.28 mg/dL

## 2022-01-19 MED ORDER — HYDROXYZINE HCL 50 MG PO TABS
50.0000 mg | ORAL_TABLET | Freq: Four times a day (QID) | ORAL | Status: AC | PRN
  Administered 2022-01-19 – 2022-01-20 (×2): 50 mg via ORAL
  Filled 2022-01-19 (×2): qty 1

## 2022-01-19 NOTE — Progress Notes (Signed)
Patient calm and pleasant during assessment denying SI/HI/AVH. Pt endorses anxiety. Pt observed interacting appropriately with staff and peers on the unit. Pt compliant with medication administration per MD orders. Pt given education, support, and encouragement to be active in his treatment plan. Pt being monitored Q 15 minutes for safety per unit protocol.  

## 2022-01-19 NOTE — Progress Notes (Signed)
Recreation Therapy Notes   Date: 01/19/2022  Time: 10:05am   Location: Courtyard     Behavioral response: Appropriate    Intervention Topic: Leisure     Discussion/Intervention:  Group content today was focused on leisure. The group defined what leisure is and some positive leisure activities they participate in. Individuals identified the difference between good and bad leisure. Participants expressed how they feel after participating in the leisure of their choice. The group discussed how they go about picking a leisure activity and if others are involved in their leisure activities. The patient stated how many leisure activities they too choose from and reasons why it is important to have leisure time.   Clinical Observations/Feedback:  Patient came to group late due to unknown reasons. Individual was social with peers and staff during intervention.  Clorinda Wyble LRT/CTRS         Heidi Pearson 01/19/2022 12:10 PM

## 2022-01-19 NOTE — Progress Notes (Signed)
Pt denies SI/HI/AVH and verbally agrees to approach staff if these become apparent or before harming themselves/others. Rates depression 4/10. Rates anxiety 8/10. Rates pain 6/10. Pt has been asking for anxiety medication throughout the day. Pt requested for an increase or another anxiety medication. Pt is somatic and has been preoccupied on w/d symptoms that she is sure she is having. Pt stated she couldn't eat her lunch because she was not feeling well. Pt complained of nausea this morning and was given a PRN for that. As well as general body aches. Pt around lunch asked for more medication. Pt has been seen talking to other pts well. Pt is constantly complaining that her anxiety was high. Pt given PRN and the increased dose said helped her more. Scheduled medications administered to pt, per MD orders. RN provided support and encouragement to pt. Q15 min safety checks implemented and continued. Pt safe on the unit. RN will continue to monitor and intervene as needed. Problem: Pain Managment: Goal: General experience of comfort will improve Outcome: Progressing   Problem: Coping: Goal: Level of anxiety will decrease Outcome: Not Progressing     01/19/22 0808  Psych Admission Type (Psych Patients Only)  Admission Status Involuntary  Psychosocial Assessment  Patient Complaints Anxiety;Depression  Eye Contact Fair  Facial Expression Anxious;Sad  Affect Anxious;Preoccupied  Speech Logical/coherent  Interaction Assertive;Needy  Motor Activity Slow  Appearance/Hygiene Unremarkable  Behavior Characteristics Cooperative;Anxious  Mood Anxious;Pleasant;Preoccupied  Thought Process  Coherency WDL  Content Preoccupation  Delusions Somatic  Perception WDL  Hallucination None reported or observed  Judgment Impaired  Confusion None  Danger to Self  Current suicidal ideation? Denies  Danger to Others  Danger to Others None reported or observed

## 2022-01-19 NOTE — Group Note (Signed)
[facility] LCSW Group Therapy Note   Group Date: 01/19/2022 Start Time: 1300 End Time: 1400   Type of Therapy/Topic:  Group Therapy:  Balance in Life  Participation Level:  Active   Description of Group:    This group will address the concept of balance and how it feels and looks when one is unbalanced. Patients will be encouraged to process areas in their lives that are out of balance, and identify reasons for remaining unbalanced. Facilitators will guide patients utilizing problem- solving interventions to address and correct the stressor making their life unbalanced. Understanding and applying boundaries will be explored and addressed for obtaining  and maintaining a balanced life. Patients will be encouraged to explore ways to assertively make their unbalanced needs known to significant others in their lives, using other group members and facilitator for support and feedback.  Therapeutic Goals: Patient will identify two or more emotions or situations they have that consume much of in their lives. Patient will identify signs/triggers that life has become out of balance:  Patient will identify two ways to set boundaries in order to achieve balance in their lives:  Patient will demonstrate ability to communicate their needs through discussion and/or role plays  Summary of Patient Progress: Patient was present for the entirety of group. She stated that drugs and anxiety are things that throw her off balance. Pt shared a time when she became off balanced because of anxiety and racing thoughts which lead her to bad decisions. She endorsed an extended period of recovery in which she was living in an [address], attending [employer], and had a significant sober support system. For this pt, she states staying clean is what helps her maintain balance. Pt was actively involved in the discussion and appeared open and receptive to feedback/comments from peers and facilitator.    Therapeutic Modalities:    Cognitive Behavioral Therapy Solution-Focused Therapy Assertiveness Training   Glenis Smoker, LCSW

## 2022-01-19 NOTE — Progress Notes (Signed)
[facility] MD Progress Note  01/19/2022 3:13 PM Heidi Pearson  MRN:  [id number] Subjective: Follow-up for this 46 year old woman with anxiety and depression and substance withdrawal.  Still having significant somatic symptoms including abdominal discomfort.  Feeling sort of achy.  Not sleeping well.  No active suicidal thoughts but still feeling depressed and anxious Principal Problem: Adjustment disorder with mixed anxiety and depressed mood Diagnosis: Principal Problem:   Adjustment disorder with mixed anxiety and depressed mood Active Problems:   Anxiety   Benzodiazepine withdrawal (HCC)   Opiate withdrawal (HCC)  Total Time spent with patient: 30 minutes  Past Psychiatric History: Past history of substance use disorder anxiety and depression  Past Medical History:  Past Medical History:  Diagnosis Date   ADD (attention deficit disorder)    Anxiety    Anxiety    Chronic knee pain    Depression    PTSD (post-traumatic stress disorder)     Past Surgical History:  Procedure Laterality Date   CESAREAN SECTION     Family History:  Family History  Problem Relation Age of Onset   Hypertension Mother    Cancer Mother    Hypertension Father    Family Psychiatric  History: See previous Social History:  Social History   Substance and Sexual Activity  Alcohol Use Yes   Comment: socially      Social History   Substance and Sexual Activity  Drug Use No   Types: Oxycodone, Hydrocodone   Comment: Quit 04/11/2012 herion two years ago    Social History   Socioeconomic History   Marital status: Divorced    Spouse name: Not on file   Number of children: Not on file   Years of education: Not on file   Highest education level: Not on file  Occupational History   Not on file  Tobacco Use   Smoking status: Never   Smokeless tobacco: Never  Vaping Use   Vaping Use: Never used  Substance and Sexual Activity   Alcohol use: Yes    Comment: socially    Drug use: No    Types:  Oxycodone, Hydrocodone    Comment: Quit 04/11/2012 herion two years ago   Sexual activity: Not Currently    Birth control/protection: None  Other Topics Concern   Not on file  Social History Narrative   Not on file   Social Determinants of Health   Financial Resource Strain: Not on file  Food Insecurity: No Food Insecurity (01/18/2022)   Hunger Vital Sign    Worried About Running Out of Food in the Last Year: Never true    Ran Out of Food in the Last Year: Never true  Transportation Needs: Not on file  Physical Activity: Not on file  Stress: Not on file  Social Connections: Not on file   Additional Social History:                         Sleep: Poor  Appetite:  Poor  Current Medications: Current Facility-Administered Medications  Medication Dose Route Frequency Provider Last Rate Last Admin   acetaminophen (TYLENOL) tablet 650 mg  650 mg Oral Q6H PRN Eletha Culbertson T, MD       alum & mag hydroxide-simeth (MAALOX/MYLANTA) 200-200-20 MG/5ML suspension 30 mL  30 mL Oral Q4H PRN Raschelle Wisenbaker, Jackquline Denmark, MD   30 mL at 01/19/22 0640   dicyclomine (BENTYL) capsule 20 mg  20 mg Oral Q6H PRN Josef Tourigny, Jackquline Denmark, MD  20 mg at 01/19/22 1204   gabapentin (NEURONTIN) capsule 300 mg  300 mg Oral TID Hyde Sires, Jackquline Denmark, MD   300 mg at 01/19/22 1204   hydrOXYzine (ATARAX) tablet 50 mg  50 mg Oral Q6H PRN Juanelle Trueheart, Jackquline Denmark, MD       loperamide (IMODIUM) capsule 2-4 mg  2-4 mg Oral PRN Lynnley Doddridge, Jackquline Denmark, MD       OLANZapine zydis (ZYPREXA) disintegrating tablet 5 mg  5 mg Oral Q8H PRN Nettye Flegal, Jackquline Denmark, MD       And   LORazepam (ATIVAN) tablet 1 mg  1 mg Oral PRN Merryl Buckels, Jackquline Denmark, MD       magnesium hydroxide (MILK OF MAGNESIA) suspension 30 mL  30 mL Oral Daily PRN Edrik Rundle, Jackquline Denmark, MD       melatonin tablet 2.5 mg  2.5 mg Oral QHS Marleena Shubert T, MD   2.5 mg at 01/18/22 2110   methocarbamol (ROBAXIN) tablet 500 mg  500 mg Oral Q8H PRN Debroh Sieloff, Jackquline Denmark, MD   500 mg at 01/19/22 8101   naproxen (NAPROSYN)  tablet 500 mg  500 mg Oral BID PRN Ethyl Vila, Jackquline Denmark, MD   500 mg at 01/18/22 1317   ondansetron (ZOFRAN-ODT) disintegrating tablet 4 mg  4 mg Oral Q6H PRN Kadince Boxley, Jackquline Denmark, MD   4 mg at 01/19/22 7510   QUEtiapine (SEROQUEL) tablet 50 mg  50 mg Oral QHS Mattisen Pohlmann, Jackquline Denmark, MD   50 mg at 01/18/22 2110    Lab Results:  Results for orders placed or performed during the hospital encounter of 01/18/22 (from the past 48 hour(s))  Lipid panel     Status: Abnormal   Collection Time: 01/19/22  7:27 AM  Result Value Ref Range   Cholesterol 229 (H) 0 - 200 mg/dL   Triglycerides 258 (H) <150 mg/dL   HDL 43 >52 mg/dL   Total CHOL/HDL Ratio 5.3 RATIO   VLDL 31 0 - 40 mg/dL   LDL Cholesterol 778 (H) 0 - 99 mg/dL    Comment:        Total Cholesterol/HDL:CHD Risk Coronary Heart Disease Risk Table                     Men   Women  1/2 Average Risk   3.4   3.3  Average Risk       5.0   4.4  2 X Average Risk   9.6   7.1  3 X Average Risk  23.4   11.0        Use the calculated Patient Ratio above and the CHD Risk Table to determine the patient's CHD Risk.        ATP III CLASSIFICATION (LDL):  <100     mg/dL   Optimal  242-353  mg/dL   Near or Above                    Optimal  130-159  mg/dL   Borderline  614-431  mg/dL   High  >540     mg/dL   Very High Performed at [facility], [address] [place], [place] [address]   Hemoglobin A1c     Status: None   Collection Time: 01/19/22  7:27 AM  Result Value Ref Range   Hgb A1c MFr Bld 5.4 4.8 - 5.6 %    Comment: (NOTE) Pre diabetes:          5.7%-6.4%  Diabetes:              >6.4%  Glycemic control for   <7.0% adults with diabetes    Mean Plasma Glucose 108.28 mg/dL    Comment: Performed at [facility], [address], [place], [place] [address]    Blood Alcohol level:  Lab Results  Component Value Date   ETH <10 01/13/2022   ETH 96 (H) 12/11/2013    Metabolic Disorder Labs: Lab Results  Component Value Date    HGBA1C 5.4 01/19/2022   MPG 108.28 01/19/2022   MPG 108.28 01/13/2022   Lab Results  Component Value Date   PROLACTIN 22.8 01/13/2022   Lab Results  Component Value Date   CHOL 229 (H) 01/19/2022   TRIG 154 (H) 01/19/2022   HDL 43 01/19/2022   CHOLHDL 5.3 01/19/2022   VLDL 31 01/19/2022   LDLCALC 155 (H) 01/19/2022   LDLCALC 129 (H) 01/13/2022    Physical Findings: AIMS: Facial and Oral Movements Muscles of Facial Expression: None, normal Lips and Perioral Area: None, normal Jaw: None, normal Tongue: None, normal,Extremity Movements Upper (arms, wrists, hands, fingers): None, normal Lower (legs, knees, ankles, toes): None, normal, Trunk Movements Neck, shoulders, hips: None, normal, Overall Severity Severity of abnormal movements (highest score from questions above): None, normal Incapacitation due to abnormal movements: None, normal Patient's awareness of abnormal movements (rate only patient's report): No Awareness, Dental Status Current problems with teeth and/or dentures?: No Does patient usually wear dentures?: No  CIWA:    COWS:     Musculoskeletal: Strength & Muscle Tone: within normal limits Gait & Station: normal Patient leans: N/A  Psychiatric Specialty Exam:  Presentation  General Appearance: Casual; Appropriate for Environment  Eye Contact:Good  Speech:Clear and Coherent; Normal Rate  Speech Volume:Normal  Handedness:Right   Mood and Affect  Mood:Anxious; Depressed; Hopeless  Affect:Tearful; Depressed; Congruent   Thought Process  Thought Processes:Coherent  Descriptions of Associations:Circumstantial  Orientation:Full (Time, Place and Person)  Thought Content:Logical  History of Schizophrenia/Schizoaffective disorder:No  Duration of Psychotic Symptoms:No data recorded Hallucinations:No data recorded Ideas of Reference:None  Suicidal Thoughts:No data recorded Homicidal Thoughts:No data recorded  Sensorium  Memory:Immediate  Good; Recent Good; Remote Good  Judgment:Good  Insight:Good   Executive Functions  Concentration:Good  Attention Span:Good  Recall:Good  Fund of Knowledge:Good  Language:Good   Psychomotor Activity  Psychomotor Activity:No data recorded  Assets  Assets:Communication Skills; Desire for Improvement; Financial Resources/Insurance; Housing; Resilience; Physical Health; Social Support   Sleep  Sleep:No data recorded   Physical Exam: Physical Exam Vitals and nursing note reviewed.  Constitutional:      Appearance: Normal appearance.  HENT:     Head: Normocephalic and atraumatic.     Mouth/Throat:     Pharynx: Oropharynx is clear.  Eyes:     Pupils: Pupils are equal, round, and reactive to light.  Cardiovascular:     Rate and Rhythm: Normal rate and regular rhythm.  Pulmonary:     Effort: Pulmonary effort is normal.     Breath sounds: Normal breath sounds.  Abdominal:     General: Abdomen is flat.     Palpations: Abdomen is soft.  Musculoskeletal:        General: Normal range of motion.  Skin:    General: Skin is warm and dry.  Neurological:     General: No focal deficit present.     Mental Status: She is alert. Mental status is at baseline.  Psychiatric:  Attention and Perception: Attention normal.        Mood and Affect: Mood is anxious.        Speech: Speech normal.        Behavior: Behavior is cooperative.        Thought Content: Thought content normal.        Cognition and Memory: Cognition normal.    Review of Systems  Constitutional: Negative.   HENT: Negative.    Eyes: Negative.   Respiratory: Negative.    Cardiovascular: Negative.   Gastrointestinal:  Positive for abdominal pain.  Musculoskeletal: Negative.   Skin: Negative.   Neurological: Negative.   Psychiatric/Behavioral:  Positive for depression. Negative for suicidal ideas. The patient is nervous/anxious.    Blood pressure 138/84, pulse 74, temperature 98.3 F (36.8 C),  temperature source Oral, resp. rate 18, height 5\' 3"  (1.6 m), weight 96.2 kg, last menstrual period 01/07/2022, SpO2 100 %. Body mass index is 37.55 kg/m.   Treatment Plan Summary: Plan reviewed treatments for withdrawal.  Patient had trouble tolerating clonidine previously.  I will increase the dose of Vistaril she can take.  Encourage use of other withdrawal medications.  Treatment team aware that the patient would like to substance abuse rehab referral  01/09/2022, MD 01/19/2022, 3:13 PM

## 2022-01-20 DIAGNOSIS — F4323 Adjustment disorder with mixed anxiety and depressed mood: Secondary | ICD-10-CM | POA: Diagnosis not present

## 2022-01-20 MED ORDER — LOPERAMIDE HCL 2 MG PO CAPS
2.0000 mg | ORAL_CAPSULE | ORAL | Status: DC | PRN
  Administered 2022-01-21: 2 mg via ORAL
  Filled 2022-01-20: qty 1

## 2022-01-20 MED ORDER — HYDROXYZINE HCL 50 MG PO TABS
50.0000 mg | ORAL_TABLET | Freq: Four times a day (QID) | ORAL | Status: DC | PRN
  Administered 2022-01-20 – 2022-01-25 (×15): 50 mg via ORAL
  Filled 2022-01-20 (×15): qty 1

## 2022-01-20 MED ORDER — CLONIDINE HCL 0.1 MG PO TABS
0.0500 mg | ORAL_TABLET | Freq: Three times a day (TID) | ORAL | Status: DC | PRN
  Administered 2022-01-20 – 2022-01-25 (×10): 0.05 mg via ORAL
  Filled 2022-01-20 (×10): qty 1

## 2022-01-20 NOTE — [facility] Group Notes (Signed)
[facility] Group Notes:  (Nursing/MHT/Case Management/Adjunct)  Date:  01/20/2022  Time:  3:34 AM  Type of Therapy:  Group Therapy  Participation Level:  Active  Participation Quality:  Appropriate  Affect:  Appropriate  Cognitive:  Alert  Insight:  Appropriate  Engagement in Group:  Engaged  Modes of Intervention:  Discussion  Summary of Progress/Problems:Looking forward to the future and not going backwards.  Diona Browner 01/20/2022, 3:34 AM

## 2022-01-20 NOTE — Progress Notes (Signed)
Patient remains anxious due to w/d, she was visible in the milieu interacting with peers most of the shift. She was cooperative with treatment and compliant with medications.

## 2022-01-20 NOTE — Progress Notes (Addendum)
Recreation Therapy Notes  Date: 01/20/2022  Time: 10:55am   Location: Craft room    Behavioral response: Appropriate    Intervention Topic: Self-care     Discussion/Intervention:  Group content today was focused on Self-Care. The group defined self-care and some positive ways they care for themselves. Individuals expressed ways and reasons why they neglected any self-care in the past. Patients described ways to improve self-care in the future. The group explained what could happen if they did not do any self-care activities at all. The group participated in the intervention "self-care assessment" where they had a chance to discover some of their weaknesses and strengths in self- care. Patient came up with a self-care plan to improve themselves in the future.   Clinical Observations/Feedback:  Patient came to group and stated that self-care is important for her sobriety. She identified AA/NA as how she participates in self-care. Individual was social with peers and staff during intervention.  Sudiksha Victor LRT/CTRS         Gregary Blackard 01/20/2022 1:36 PM

## 2022-01-20 NOTE — Group Note (Signed)
[facility] LCSW Group Therapy Note   Group Date: 01/20/2022 Start Time: 1300 End Time: 1400  Type of Therapy and Topic:  Group Therapy:  Feelings around Relapse and Recovery  Participation Level:  Active    Description of Group:    Patients in this group will discuss emotions they experience before and after a relapse. They will process how experiencing these feelings, or avoidance of experiencing them, relates to having a relapse. Facilitator will guide patients to explore emotions they have related to recovery. Patients will be encouraged to process which emotions are more powerful. They will be guided to discuss the emotional reaction significant others in their lives may have to patients' relapse or recovery. Patients will be assisted in exploring ways to respond to the emotions of others without this contributing to a relapse.  Therapeutic Goals: Patient will identify two or more emotions that lead to relapse for them:  Patient will identify two emotions that result when they relapse:  Patient will identify two emotions related to recovery:  Patient will demonstrate ability to communicate their needs through discussion and/or role plays.   Summary of Patient Progress: Patient was present for the entirety of the group process. She defined relapse as going back to use/or a behavioral after not engaging in it for a period of time. Pt believes that relapse is a part of recovery because it allows you to hopefully prevent it moving forward. However, she cautions that it is not good to obsess over a relapse. Pt is trying to regain some recovery time and maintain it. Risk factor identified as her mind not being "strong enough". Resiliency factors reported as church, attending support group meetings, and engaging in hobbies. Moving forward pt feels that she needs to make sure that her mindset is strong and utilizing her support system. Pt was actively engaged in the discussion and she appeared open and  receptive to feedback/comments from both peers and facilitator.    Therapeutic Modalities:   Cognitive Behavioral Therapy Solution-Focused Therapy Assertiveness Training Relapse Prevention Therapy   Glenis Smoker, LCSW

## 2022-01-20 NOTE — Progress Notes (Signed)
Recreation Therapy Notes  INPATIENT RECREATION THERAPY ASSESSMENT  Patient Details Name: Heidi Pearson MRN: [id number] DOB: [birth year] Today's Date: 01/20/2022       Information Obtained From: Patient  Able to Participate in Assessment/Interview: Yes  Patient Presentation: Responsive  Reason for Admission (Per Patient): Active Symptoms, Substance Abuse  Patient Stressors:    Coping Skills:   Talk, Prayer, Read  Leisure Interests (2+):  Exercise - Walking, Exercise - Running, Community - [employer], Exercise - Jogging, Music - Listen  Frequency of Recreation/Participation: Monthly  Awareness of Community Resources:  Yes  Community Resources:  [place], [place]  Current Use: No  If no, Barriers?: Attitudinal  Expressed Interest in [employer] Information: Yes  County of Residence:  [place]  Patient Main Form of Transportation: Set designer  Patient Strengths:  People person  Patient Identified Areas of Improvement:  Have boundaries  Patient Goal for Hospitalization:  Get my mind clear  Current SI (including self-harm):  No  Current HI:  No  Current AVH: No  Staff Intervention Plan: Group Attendance, Collaborate with Interdisciplinary Treatment Team  Consent to Intern Participation: N/A  Hanifah Royse 01/20/2022, 1:57 PM

## 2022-01-20 NOTE — BH IP Treatment Plan (Signed)
Interdisciplinary Treatment and Diagnostic Plan Update  01/20/2022 Time of Session: 09:59 Heidi Pearson MRN: [id number]  Principal Diagnosis: Adjustment disorder with mixed anxiety and depressed mood  Secondary Diagnoses: Principal Problem:   Adjustment disorder with mixed anxiety and depressed mood Active Problems:   Anxiety   Benzodiazepine withdrawal (HCC)   Opiate withdrawal (HCC)   Current Medications:  Current Facility-Administered Medications  Medication Dose Route Frequency Provider Last Rate Last Admin   acetaminophen (TYLENOL) tablet 650 mg  650 mg Oral Q6H PRN Clapacs, John T, MD       alum & mag hydroxide-simeth (MAALOX/MYLANTA) 200-200-20 MG/5ML suspension 30 mL  30 mL Oral Q4H PRN Clapacs, John T, MD   30 mL at 01/20/22 0622   dicyclomine (BENTYL) capsule 20 mg  20 mg Oral Q6H PRN Clapacs, John T, MD   20 mg at 01/20/22 1012   gabapentin (NEURONTIN) capsule 300 mg  300 mg Oral TID Clapacs, John T, MD   300 mg at 01/20/22 0746   magnesium hydroxide (MILK OF MAGNESIA) suspension 30 mL  30 mL Oral Daily PRN Clapacs, John T, MD       melatonin tablet 2.5 mg  2.5 mg Oral QHS Clapacs, John T, MD   2.5 mg at 01/19/22 2110   methocarbamol (ROBAXIN) tablet 500 mg  500 mg Oral Q8H PRN Clapacs, John T, MD   500 mg at 01/20/22 0350   naproxen (NAPROSYN) tablet 500 mg  500 mg Oral BID PRN Clapacs, John T, MD   500 mg at 01/19/22 2022   OLANZapine zydis (ZYPREXA) disintegrating tablet 5 mg  5 mg Oral Q8H PRN Clapacs, John T, MD   5 mg at 01/20/22 0747   QUEtiapine (SEROQUEL) tablet 50 mg  50 mg Oral QHS Clapacs, John T, MD   50 mg at 01/19/22 2110   PTA Medications: Medications Prior to Admission  Medication Sig Dispense Refill Last Dose   acetaminophen (TYLENOL) 500 MG tablet Take 500 mg by mouth every 6 (six) hours as needed for mild pain, moderate pain, fever or headache.      amphetamine-dextroamphetamine (ADDERALL) 20 MG tablet Take 20 mg by mouth 2 (two) times daily.       aspirin-acetaminophen-caffeine (EXCEDRIN MIGRAINE) 250-250-65 MG tablet Take 1 tablet by mouth every 6 (six) hours as needed for headache or migraine.      buprenorphine-naloxone (SUBOXONE) 8-2 mg SUBL SL tablet Place 1 tablet under the tongue 3 (three) times daily.      buPROPion (WELLBUTRIN XL) 150 MG 24 hr tablet Take 150 mg by mouth daily.      clonazePAM (KLONOPIN) 1 MG tablet Take 1 mg by mouth every 12 (twelve) hours as needed for anxiety.      diphenhydrAMINE (BENADRYL) 25 MG tablet Take 25 mg by mouth every 6 (six) hours as needed for allergies.      gabapentin (NEURONTIN) 100 MG capsule Take 100-200 mg by mouth at bedtime.      ibuprofen (ADVIL) 200 MG tablet Take 800 mg by mouth every 6 (six) hours as needed for mild pain.      VRAYLAR 1.5 MG capsule Take 1.5 mg by mouth daily.       Patient Stressors: Substance abuse   Traumatic event    Patient Strengths: Ability for insight  Average or above average intelligence  Capable of independent living  Education administrator  Physical Health  Supportive family/friends  Work skills   Treatment Modalities: Medication Management,  Group therapy, Case management,  1 to 1 session with clinician, Psychoeducation, Recreational therapy.   Physician Treatment Plan for Primary Diagnosis: Adjustment disorder with mixed anxiety and depressed mood Long Term Goal(s): Improvement in symptoms so as ready for discharge   Short Term Goals: Ability to maintain clinical measurements within normal limits will improve Compliance with prescribed medications will improve Ability to identify triggers associated with substance abuse/mental health issues will improve Ability to verbalize feelings will improve Ability to disclose and discuss suicidal ideas Ability to demonstrate self-control will improve  Medication Management: Evaluate patient's response, side effects, and tolerance of medication regimen.  Therapeutic Interventions: 1  to 1 sessions, Unit Group sessions and Medication administration.  Evaluation of Outcomes: Progressing  Physician Treatment Plan for Secondary Diagnosis: Principal Problem:   Adjustment disorder with mixed anxiety and depressed mood Active Problems:   Anxiety   Benzodiazepine withdrawal (HCC)   Opiate withdrawal (HCC)  Long Term Goal(s): Improvement in symptoms so as ready for discharge   Short Term Goals: Ability to maintain clinical measurements within normal limits will improve Compliance with prescribed medications will improve Ability to identify triggers associated with substance abuse/mental health issues will improve Ability to verbalize feelings will improve Ability to disclose and discuss suicidal ideas Ability to demonstrate self-control will improve     Medication Management: Evaluate patient's response, side effects, and tolerance of medication regimen.  Therapeutic Interventions: 1 to 1 sessions, Unit Group sessions and Medication administration.  Evaluation of Outcomes: Progressing   RN Treatment Plan for Primary Diagnosis: Adjustment disorder with mixed anxiety and depressed mood Long Term Goal(s): Knowledge of disease and therapeutic regimen to maintain health will improve  Short Term Goals: Ability to remain free from injury will improve, Ability to verbalize frustration and anger appropriately will improve, Ability to demonstrate self-control, Ability to participate in decision making will improve, Ability to verbalize feelings will improve, Ability to disclose and discuss suicidal ideas, Ability to identify and develop effective coping behaviors will improve, and Compliance with prescribed medications will improve  Medication Management: RN will administer medications as ordered by provider, will assess and evaluate patient's response and provide education to patient for prescribed medication. RN will report any adverse and/or side effects to prescribing  provider.  Therapeutic Interventions: 1 on 1 counseling sessions, Psychoeducation, Medication administration, Evaluate responses to treatment, Monitor vital signs and CBGs as ordered, Perform/monitor CIWA, COWS, AIMS and Fall Risk screenings as ordered, Perform wound care treatments as ordered.  Evaluation of Outcomes: Progressing   LCSW Treatment Plan for Primary Diagnosis: Adjustment disorder with mixed anxiety and depressed mood Long Term Goal(s): Safe transition to appropriate next level of care at discharge, Engage patient in therapeutic group addressing interpersonal concerns.  Short Term Goals: Engage patient in aftercare planning with referrals and resources, Increase social support, Increase ability to appropriately verbalize feelings, Increase emotional regulation, Facilitate acceptance of mental health diagnosis and concerns, Facilitate patient progression through stages of change regarding substance use diagnoses and concerns, Identify triggers associated with mental health/substance abuse issues, and Increase skills for wellness and recovery  Therapeutic Interventions: Assess for all discharge needs, 1 to 1 time with Social worker, Explore available resources and support systems, Assess for adequacy in community support network, Educate family and significant other(s) on suicide prevention, Complete Psychosocial Assessment, Interpersonal group therapy.  Evaluation of Outcomes: Progressing   Progress in Treatment: Attending groups: Yes. Participating in groups: Yes. Taking medication as prescribed: Yes. Toleration medication: Yes. Family/Significant other contact made: No,  will contact:  best friend, Sheran Spine. Patient understands diagnosis: Yes. Discussing patient identified problems/goals with staff: Yes. Medical problems stabilized or resolved: Yes. Denies suicidal/homicidal ideation: Yes. Issues/concerns per patient self-inventory: No. Other: none.  New problem(s)  identified: No, Describe:  none identified.  New Short Term/Long Term Goal(s): detox, medication management for mood stabilization; elimination of SI thoughts; development of comprehensive mental wellness/sobriety plan.  Patient Goals:  "Just to feel better, have my mind clearer."  Discharge Plan or Barriers: CSW will assist with development of an appropriate aftercare/discharge plan.    Reason for Continuation of Hospitalization: Depression Medication stabilization Suicidal ideation Withdrawal symptoms  Estimated Length of Stay: 1-7 days  Last 3 [place] Suicide Severity Risk Score: [place] Admission (Current) from 01/18/2022 in [place] Most recent reading at 01/18/2022  2:33 PM ED from 01/16/2022 in [facility] Most recent reading at 01/16/2022  8:37 AM ED from 01/16/2022 in [place] Most recent reading at 01/16/2022 12:57 AM  C-SSRS RISK CATEGORY No Risk No Risk Error: Q3, 4, or 5 should not be populated when Q2 is No       Last PHQ 2/9 Scores:    01/16/2022   10:58 AM 01/15/2022   11:45 AM 01/13/2022   12:49 PM  Depression screen PHQ 2/9  Decreased Interest 1 2 2   Down, Depressed, Hopeless 2 1 2   PHQ - 2 Score 3 3 4   Altered sleeping 3 3 2   Tired, decreased energy 3 3 2   Change in appetite 3 3 2   Feeling bad or failure about yourself  2 3 2   Trouble concentrating 2 3 2   Moving slowly or fidgety/restless 0 2 2  Suicidal thoughts 1 1 2   PHQ-9 Score 17 21 18   Difficult doing work/chores Somewhat difficult Very difficult Very difficult    Scribe for Treatment Team: Shirl Harris, LCSW 01/20/2022 10:36 AM

## 2022-01-20 NOTE — Progress Notes (Signed)
[facility] MD Progress Note  01/20/2022 2:17 PM Heidi Pearson  MRN:  [id number] Subjective: Follow-up patient with anxiety and feelings of drug withdrawal.  Patient continues to feel anxious and complaining of various somatic issues which she relates to withdrawal.  Affect anxious but she is participating appropriately.  Denies active suicidal thoughts Principal Problem: Adjustment disorder with mixed anxiety and depressed mood Diagnosis: Principal Problem:   Adjustment disorder with mixed anxiety and depressed mood Active Problems:   Anxiety   Benzodiazepine withdrawal (HCC)   Opiate withdrawal (HCC)  Total Time spent with patient: 20 minutes  Past Psychiatric History: Past history of depression and substance issues  Past Medical History:  Past Medical History:  Diagnosis Date   ADD (attention deficit disorder)    Anxiety    Anxiety    Chronic knee pain    Depression    PTSD (post-traumatic stress disorder)     Past Surgical History:  Procedure Laterality Date   CESAREAN SECTION     Family History:  Family History  Problem Relation Age of Onset   Hypertension Mother    Cancer Mother    Hypertension Father    Family Psychiatric  History: See previous Social History:  Social History   Substance and Sexual Activity  Alcohol Use Yes   Comment: socially      Social History   Substance and Sexual Activity  Drug Use No   Types: Oxycodone, Hydrocodone   Comment: Quit 04/11/2012 herion two years ago    Social History   Socioeconomic History   Marital status: Divorced    Spouse name: Not on file   Number of children: Not on file   Years of education: Not on file   Highest education level: Not on file  Occupational History   Not on file  Tobacco Use   Smoking status: Never   Smokeless tobacco: Never  Vaping Use   Vaping Use: Never used  Substance and Sexual Activity   Alcohol use: Yes    Comment: socially    Drug use: No    Types: Oxycodone, Hydrocodone     Comment: Quit 04/11/2012 herion two years ago   Sexual activity: Not Currently    Birth control/protection: None  Other Topics Concern   Not on file  Social History Narrative   Not on file   Social Determinants of Health   Financial Resource Strain: Not on file  Food Insecurity: No Food Insecurity (01/18/2022)   Hunger Vital Sign    Worried About Running Out of Food in the Last Year: Never true    Ran Out of Food in the Last Year: Never true  Transportation Needs: Not on file  Physical Activity: Not on file  Stress: Not on file  Social Connections: Not on file   Additional Social History:                         Sleep: Fair  Appetite:  Fair  Current Medications: Current Facility-Administered Medications  Medication Dose Route Frequency Provider Last Rate Last Admin   acetaminophen (TYLENOL) tablet 650 mg  650 mg Oral Q6H PRN Monisha Siebel T, MD       alum & mag hydroxide-simeth (MAALOX/MYLANTA) 200-200-20 MG/5ML suspension 30 mL  30 mL Oral Q4H PRN Kari Kerth, Madie Reno, MD   30 mL at 01/20/22 0622   dicyclomine (BENTYL) capsule 20 mg  20 mg Oral Q6H PRN Janei Scheff, Madie Reno, MD   20 mg  at 01/20/22 1012   gabapentin (NEURONTIN) capsule 300 mg  300 mg Oral TID Amarachi Kotz, Jackquline Denmark, MD   300 mg at 01/20/22 1306   magnesium hydroxide (MILK OF MAGNESIA) suspension 30 mL  30 mL Oral Daily PRN Langston Summerfield, Jackquline Denmark, MD       melatonin tablet 2.5 mg  2.5 mg Oral QHS Seleny Allbright T, MD   2.5 mg at 01/19/22 2110   methocarbamol (ROBAXIN) tablet 500 mg  500 mg Oral Q8H PRN Zackery Brine, Jackquline Denmark, MD   500 mg at 01/20/22 1306   naproxen (NAPROSYN) tablet 500 mg  500 mg Oral BID PRN Kylieann Eagles, Jackquline Denmark, MD   500 mg at 01/20/22 1306   OLANZapine zydis (ZYPREXA) disintegrating tablet 5 mg  5 mg Oral Q8H PRN Briena Swingler, Jackquline Denmark, MD   5 mg at 01/20/22 0747   QUEtiapine (SEROQUEL) tablet 50 mg  50 mg Oral QHS Alexismarie Flaim, Jackquline Denmark, MD   50 mg at 01/19/22 2110    Lab Results:  Results for orders placed or performed during  the hospital encounter of 01/18/22 (from the past 48 hour(s))  Lipid panel     Status: Abnormal   Collection Time: 01/19/22  7:27 AM  Result Value Ref Range   Cholesterol 229 (H) 0 - 200 mg/dL   Triglycerides 630 (H) <150 mg/dL   HDL 43 >16 mg/dL   Total CHOL/HDL Ratio 5.3 RATIO   VLDL 31 0 - 40 mg/dL   LDL Cholesterol 010 (H) 0 - 99 mg/dL    Comment:        Total Cholesterol/HDL:CHD Risk Coronary Heart Disease Risk Table                     Men   Women  1/2 Average Risk   3.4   3.3  Average Risk       5.0   4.4  2 X Average Risk   9.6   7.1  3 X Average Risk  23.4   11.0        Use the calculated Patient Ratio above and the CHD Risk Table to determine the patient's CHD Risk.        ATP III CLASSIFICATION (LDL):  <100     mg/dL   Optimal  932-355  mg/dL   Near or Above                    Optimal  130-159  mg/dL   Borderline  732-202  mg/dL   High  >542     mg/dL   Very High Performed at [facility], [address] [place], [place] [address]   Hemoglobin A1c     Status: None   Collection Time: 01/19/22  7:27 AM  Result Value Ref Range   Hgb A1c MFr Bld 5.4 4.8 - 5.6 %    Comment: (NOTE) Pre diabetes:          5.7%-6.4%  Diabetes:              >6.4%  Glycemic control for   <7.0% adults with diabetes    Mean Plasma Glucose 108.28 mg/dL    Comment: Performed at [facility], [address], [place], [place] [address]    Blood Alcohol level:  Lab Results  Component Value Date   ETH <10 01/13/2022   ETH 96 (H) 12/11/2013    Metabolic Disorder Labs: Lab Results  Component Value Date  HGBA1C 5.4 01/19/2022   MPG 108.28 01/19/2022   MPG 108.28 01/13/2022   Lab Results  Component Value Date   PROLACTIN 22.8 01/13/2022   Lab Results  Component Value Date   CHOL 229 (H) 01/19/2022   TRIG 154 (H) 01/19/2022   HDL 43 01/19/2022   CHOLHDL 5.3 01/19/2022   VLDL 31 01/19/2022   LDLCALC 155 (H) 01/19/2022   LDLCALC 129 (H) 01/13/2022     Physical Findings: AIMS: Facial and Oral Movements Muscles of Facial Expression: None, normal Lips and Perioral Area: None, normal Jaw: None, normal Tongue: None, normal,Extremity Movements Upper (arms, wrists, hands, fingers): None, normal Lower (legs, knees, ankles, toes): None, normal, Trunk Movements Neck, shoulders, hips: None, normal, Overall Severity Severity of abnormal movements (highest score from questions above): None, normal Incapacitation due to abnormal movements: None, normal Patient's awareness of abnormal movements (rate only patient's report): No Awareness, Dental Status Current problems with teeth and/or dentures?: No Does patient usually wear dentures?: No  CIWA:    COWS:     Musculoskeletal: Strength & Muscle Tone: within normal limits Gait & Station: normal Patient leans: N/A  Psychiatric Specialty Exam:  Presentation  General Appearance: Casual; Appropriate for Environment  Eye Contact:Good  Speech:Clear and Coherent; Normal Rate  Speech Volume:Normal  Handedness:Right   Mood and Affect  Mood:Anxious; Depressed; Hopeless  Affect:Tearful; Depressed; Congruent   Thought Process  Thought Processes:Coherent  Descriptions of Associations:Circumstantial  Orientation:Full (Time, Place and Person)  Thought Content:Logical  History of Schizophrenia/Schizoaffective disorder:No  Duration of Psychotic Symptoms:No data recorded Hallucinations:No data recorded Ideas of Reference:None  Suicidal Thoughts:No data recorded Homicidal Thoughts:No data recorded  Sensorium  Memory:Immediate Good; Recent Good; Remote Good  Judgment:Good  Insight:Good   Executive Functions  Concentration:Good  Attention Span:Good  [place]  Language:Good   Psychomotor Activity  Psychomotor Activity:No data recorded  Assets  Assets:Communication Skills; Desire for Improvement; Financial Resources/Insurance; Housing;  Resilience; Physical Health; Social Support   Sleep  Sleep:No data recorded   Physical Exam: Physical Exam Vitals and nursing note reviewed.  Constitutional:      Appearance: Normal appearance.  HENT:     Head: Normocephalic and atraumatic.     Mouth/Throat:     Pharynx: Oropharynx is clear.  Eyes:     Pupils: Pupils are equal, round, and reactive to light.  Cardiovascular:     Rate and Rhythm: Normal rate and regular rhythm.  Pulmonary:     Effort: Pulmonary effort is normal.     Breath sounds: Normal breath sounds.  Abdominal:     General: Abdomen is flat.     Palpations: Abdomen is soft.  Musculoskeletal:        General: Normal range of motion.  Skin:    General: Skin is warm and dry.  Neurological:     General: No focal deficit present.     Mental Status: She is alert. Mental status is at baseline.  Psychiatric:        Attention and Perception: Attention normal.        Mood and Affect: Mood is anxious.        Speech: Speech normal.        Thought Content: Thought content normal.        Cognition and Memory: Cognition normal.    Review of Systems  Constitutional: Negative.   HENT: Negative.    Eyes: Negative.   Respiratory: Negative.    Cardiovascular: Negative.   Gastrointestinal:  Positive for  abdominal pain.  Musculoskeletal: Negative.   Skin: Negative.   Neurological: Negative.   Psychiatric/Behavioral:  The patient is nervous/anxious.    Blood pressure 124/86, pulse 72, temperature 98.6 F (37 C), temperature source Oral, resp. rate 18, height 5\' 3"  (1.6 m), weight 96.2 kg, last menstrual period 01/07/2022, SpO2 100 %. Body mass index is 37.55 kg/m.   Treatment Plan Summary: Medication management and Plan patient attended treatment team today and was appropriate but continues to complain of diarrhea and nausea anxiety.  Adjusted some of her as needed medicines.  We discussed adding small doses of clonidine but she needs to watch out that she does not  get dizzy from it.  01/09/2022, MD 01/20/2022, 2:17 PM

## 2022-01-20 NOTE — [facility] Group Notes (Signed)
[facility] Group Notes:  (Nursing/MHT/Case Management/Adjunct)  Date:  01/20/2022  Time:  1:46 PM  Type of Therapy:   Community Meeting  Participation Level:  Active  Participation Quality:  Appropriate, Attentive, and Sharing  Affect:  Appropriate  Cognitive:  Alert and Appropriate  Insight:  Appropriate and Good  Engagement in Group:  Engaged  Modes of Intervention:  Discussion, Education, and Support  Summary of Progress/Problems:  Heidi Pearson 01/20/2022, 1:46 PM

## 2022-01-20 NOTE — Progress Notes (Signed)
Recreation Therapy Notes  INPATIENT RECREATION TR PLAN  Patient Details Name: Heidi Pearson MRN: [id number] DOB: April 15, 1976 Today's Date: 01/20/2022  Rec Therapy Plan Is patient appropriate for Therapeutic Recreation?: Yes Treatment times per week: at least 3 Estimated Length of Stay: 5-7 days TR Treatment/Interventions: Group participation (Comment)  Discharge Criteria Pt will be discharged from therapy if:: Discharged Treatment plan/goals/alternatives discussed and agreed upon by:: Patient/family  Discharge Summary     Nyaira Hodgens 01/20/2022, 1:57 PM
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
## 2022-01-21 DIAGNOSIS — F419 Anxiety disorder, unspecified: Secondary | ICD-10-CM

## 2022-01-21 DIAGNOSIS — F1193 Opioid use, unspecified with withdrawal: Secondary | ICD-10-CM

## 2022-01-21 MED ORDER — GABAPENTIN 400 MG PO CAPS
400.0000 mg | ORAL_CAPSULE | Freq: Three times a day (TID) | ORAL | Status: DC
  Administered 2022-01-21 – 2022-01-23 (×6): 400 mg via ORAL
  Filled 2022-01-21 (×7): qty 1

## 2022-01-21 MED ORDER — ONDANSETRON 4 MG PO TBDP
4.0000 mg | ORAL_TABLET | Freq: Three times a day (TID) | ORAL | Status: DC | PRN
Start: 2022-01-21 — End: 2022-01-25
  Administered 2022-01-21 – 2022-01-25 (×10): 4 mg via ORAL
  Filled 2022-01-21 (×10): qty 1

## 2022-01-21 MED ORDER — QUETIAPINE FUMARATE 100 MG PO TABS
100.0000 mg | ORAL_TABLET | Freq: Every day | ORAL | Status: DC
  Administered 2022-01-21: 100 mg via ORAL
  Filled 2022-01-21: qty 1

## 2022-01-21 NOTE — Plan of Care (Signed)
Pt cooperative and with anxious mood/affect.  Pt requesting mulitiple PRN medications for anxiety, pain and nausea.  Pt stated that she is having "withdrawal"   Pt reports fair sleep with help of medication.  Pt reports fair appetite and low energy and poor concentration.  Pt reports depression 8/10, hopelessness 3/10 and anxiety 8/10.  Pt reports tremors, agitation, nausea, chilling and cramping.  Pt says she "wants to get her meds right."  Denies SI.  Pt. Slept some this afternoon.  She received PRNs for GI symptoms and anxiety after dinner.  Safety maintained; continue 15 minute rounds.

## 2022-01-21 NOTE — Progress Notes (Signed)
[facility] MD Progress Note  01/21/2022 9:34 AM Heidi Pearson  MRN:  [id number] Subjective:  Patient remains anxious due to w/d, she was visible in the milieu interacting with peers most of the shift. She was cooperative with treatment and compliant with medications.  Patient reports ongoing anxiety, restlessness and diarrhea. She did not sleep well last night, getting about 2-3 hours. She reports some depression due to her current condition. She has tried Paxil in the past and became manic on it and therefore she cannot take SSRIs. She is denying current SI/HI and is requesting some help with sleep.  Principal Problem: Adjustment disorder with mixed anxiety and depressed mood Diagnosis: Principal Problem:   Adjustment disorder with mixed anxiety and depressed mood Active Problems:   Anxiety   Benzodiazepine withdrawal (HCC)   Opiate withdrawal (HCC)  Total Time spent with patient: 20 minutes  Past Psychiatric History: Patient has a history of substance use problems including alcohol and opiates and benzodiazepines going back several years.  Has had some experience with substance abuse therapy treatment.  No past suicide attempts but had a history of self-mutilation as a child. Anxiety  Past Medical History:  Past Medical History:  Diagnosis Date   ADD (attention deficit disorder)    Anxiety    Anxiety    Chronic knee pain    Depression    PTSD (post-traumatic stress disorder)     Past Surgical History:  Procedure Laterality Date   CESAREAN SECTION     Family History:  Family History  Problem Relation Age of Onset   Hypertension Mother    Cancer Mother    Hypertension Father    Family Psychiatric  History: Son died of overdose of opiates although she says she does not think he was a regular user Social History:  Social History   Substance and Sexual Activity  Alcohol Use Yes   Comment: socially      Social History   Substance and Sexual Activity  Drug Use No   Types:  Oxycodone, Hydrocodone   Comment: Quit 04/11/2012 herion two years ago    Social History   Socioeconomic History   Marital status: Divorced    Spouse name: Not on file   Number of children: Not on file   Years of education: Not on file   Highest education level: Not on file  Occupational History   Not on file  Tobacco Use   Smoking status: Never   Smokeless tobacco: Never  Vaping Use   Vaping Use: Never used  Substance and Sexual Activity   Alcohol use: Yes    Comment: socially    Drug use: No    Types: Oxycodone, Hydrocodone    Comment: Quit 04/11/2012 herion two years ago   Sexual activity: Not Currently    Birth control/protection: None  Other Topics Concern   Not on file  Social History Narrative   Not on file   Social Determinants of Health   Financial Resource Strain: Not on file  Food Insecurity: No Food Insecurity (01/18/2022)   Hunger Vital Sign    Worried About Running Out of Food in the Last Year: Never true    Ran Out of Food in the Last Year: Never true  Transportation Needs: Not on file  Physical Activity: Not on file  Stress: Not on file  Social Connections: Not on file   Additional Social History:  Sleep: Poor  Appetite:  Fair  Current Medications: Current Facility-Administered Medications  Medication Dose Route Frequency Provider Last Rate Last Admin   acetaminophen (TYLENOL) tablet 650 mg  650 mg Oral Q6H PRN Clapacs, John T, MD       alum & mag hydroxide-simeth (MAALOX/MYLANTA) 200-200-20 MG/5ML suspension 30 mL  30 mL Oral Q4H PRN Clapacs, John T, MD   30 mL at 01/20/22 9767   cloNIDine (CATAPRES) tablet 0.05 mg  0.05 mg Oral Q8H PRN Clapacs, John T, MD   0.05 mg at 01/21/22 3419   dicyclomine (BENTYL) capsule 20 mg  20 mg Oral Q6H PRN Clapacs, John T, MD   20 mg at 01/20/22 1012   gabapentin (NEURONTIN) capsule 300 mg  300 mg Oral TID Clapacs, Jackquline Denmark, MD   300 mg at 01/21/22 0841   hydrOXYzine (ATARAX)  tablet 50 mg  50 mg Oral Q6H PRN Clapacs, John T, MD   50 mg at 01/21/22 3790   loperamide (IMODIUM) capsule 2 mg  2 mg Oral PRN Clapacs, Jackquline Denmark, MD       magnesium hydroxide (MILK OF MAGNESIA) suspension 30 mL  30 mL Oral Daily PRN Clapacs, John T, MD       melatonin tablet 2.5 mg  2.5 mg Oral QHS Clapacs, John T, MD   2.5 mg at 01/20/22 2112   methocarbamol (ROBAXIN) tablet 500 mg  500 mg Oral Q8H PRN Clapacs, John T, MD   500 mg at 01/21/22 0523   naproxen (NAPROSYN) tablet 500 mg  500 mg Oral BID PRN Clapacs, John T, MD   500 mg at 01/21/22 0842   OLANZapine zydis (ZYPREXA) disintegrating tablet 5 mg  5 mg Oral Q8H PRN Clapacs, Jackquline Denmark, MD   5 mg at 01/21/22 0843   QUEtiapine (SEROQUEL) tablet 50 mg  50 mg Oral QHS Clapacs, Jackquline Denmark, MD   50 mg at 01/20/22 2111    Lab Results: No results found for this or any previous visit (from the past 48 hour(s)).  Blood Alcohol level:  Lab Results  Component Value Date   ETH <10 01/13/2022   ETH 96 (H) 12/11/2013    Metabolic Disorder Labs: Lab Results  Component Value Date   HGBA1C 5.4 01/19/2022   MPG 108.28 01/19/2022   MPG 108.28 01/13/2022   Lab Results  Component Value Date   PROLACTIN 22.8 01/13/2022   Lab Results  Component Value Date   CHOL 229 (H) 01/19/2022   TRIG 154 (H) 01/19/2022   HDL 43 01/19/2022   CHOLHDL 5.3 01/19/2022   VLDL 31 01/19/2022   LDLCALC 155 (H) 01/19/2022   LDLCALC 129 (H) 01/13/2022    Physical Findings: AIMS: Facial and Oral Movements Muscles of Facial Expression: None, normal Lips and Perioral Area: None, normal Jaw: None, normal Tongue: None, normal,Extremity Movements Upper (arms, wrists, hands, fingers): None, normal Lower (legs, knees, ankles, toes): None, normal, Trunk Movements Neck, shoulders, hips: None, normal, Overall Severity Severity of abnormal movements (highest score from questions above): None, normal Incapacitation due to abnormal movements: None, normal Patient's awareness  of abnormal movements (rate only patient's report): No Awareness, Dental Status Current problems with teeth and/or dentures?: No Does patient usually wear dentures?: No  CIWA:    COWS:     Musculoskeletal: Strength & Muscle Tone: within normal limits Gait & Station: normal Patient leans: N/A  Psychiatric Specialty Exam:  Presentation  General Appearance: Casual; Appropriate for Environment  Eye Contact:Good  Speech:Clear and  Coherent; Normal Rate  Speech Volume:Normal  Handedness:Right   Mood and Affect  Mood:Anxious; Depressed; Hopeless  Affect:Tearful; Depressed; Congruent   Thought Process  Thought Processes:Coherent  Descriptions of Associations:Circumstantial  Orientation:Full (Time, Place and Person)  Thought Content:Logical  History of Schizophrenia/Schizoaffective disorder:No  Duration of Psychotic Symptoms:No data recorded Hallucinations:No data recorded Ideas of Reference:None  Suicidal Thoughts:No data recorded Homicidal Thoughts:No data recorded  Sensorium  Memory:Immediate Good; Recent Good; Remote Good  Judgment:Good  Insight:Good   Executive Functions  Concentration:Good  Attention Span:Good  Recall:Good  Fund of Knowledge:Good  Language:Good   Psychomotor Activity  Psychomotor Activity:No data recorded  Assets  Assets:Communication Skills; Desire for Improvement; Financial Resources/Insurance; Housing; Resilience; Physical Health; Social Support   Sleep  Sleep:No data recorded   Physical Exam: Physical Exam Constitutional:      Appearance: Normal appearance. She is normal weight.  HENT:     Head: Normocephalic and atraumatic.     Nose: Nose normal.     Mouth/Throat:     Mouth: Mucous membranes are dry.     Pharynx: Oropharynx is clear.  Eyes:     Extraocular Movements: Extraocular movements intact.     Conjunctiva/sclera: Conjunctivae normal.     Pupils: Pupils are equal, round, and reactive to light.   Cardiovascular:     Rate and Rhythm: Normal rate and regular rhythm.     Pulses: Normal pulses.     Heart sounds: Normal heart sounds.  Pulmonary:     Effort: Pulmonary effort is normal.     Breath sounds: Normal breath sounds.  Abdominal:     General: Abdomen is flat. Bowel sounds are normal.     Palpations: Abdomen is soft.  Musculoskeletal:        General: Normal range of motion.     Cervical back: Normal range of motion and neck supple.  Skin:    General: Skin is warm and dry.  Neurological:     General: No focal deficit present.     Mental Status: She is alert. Mental status is at baseline.    Review of Systems  Constitutional: Negative.   HENT: Negative.    Eyes: Negative.   Respiratory: Negative.    Cardiovascular: Negative.   Gastrointestinal: Negative.   Genitourinary: Negative.   Musculoskeletal: Negative.   Skin: Negative.   Neurological: Negative.   Endo/Heme/Allergies: Negative.   Psychiatric/Behavioral:  The patient is nervous/anxious and has insomnia.    Blood pressure 134/78, pulse 87, temperature 98.5 F (36.9 C), temperature source Oral, resp. rate 20, height 5\' 3"  (1.6 m), weight 96.2 kg, last menstrual period 01/07/2022, SpO2 97 %. Body mass index is 37.55 kg/m.   Treatment Plan Summary: Daily contact with patient to assess and evaluate symptoms and progress in treatment, Medication management, and Plan :  Increase Gabapentin and Seroquel dose.  01/09/2022 01/21/2022, 9:34 AM

## 2022-01-21 NOTE — Progress Notes (Signed)
No distress noted, 15 minutes safety checks maintained will continue to monitor closer denies SI/HI/AVH, interacting appropriately will continue to monitor   

## 2022-01-22 MED ORDER — QUETIAPINE FUMARATE ER 50 MG PO TB24
100.0000 mg | ORAL_TABLET | Freq: Every day | ORAL | Status: DC
  Administered 2022-01-22: 100 mg via ORAL
  Filled 2022-01-22: qty 2

## 2022-01-22 NOTE — Progress Notes (Signed)
Patient alert and oriented x 4, affect is blunted, but brightens upon approach, she denies pain or discomfort at this time. Patient approached  Clinical research associate and asked that her room should not be opened during 15 minutes rounding that because it wakes her up and scares her but, it is okay for staff to flash the lights in her room. The tech on  the floor was advised about this and everyone agreed that for the patient's comfort to get some sleep it was best we respect her wish. Patient was offered emotional support and encouragement, 15 minutes safety checks maintained will continue to monitor closely.

## 2022-01-22 NOTE — Plan of Care (Signed)
Patient appears sad and anxious states " my anxiety is high today. I need to fix a lot of things." Patient asked for all the PRNs she can have for anxiety. Patient visible in the milieu. Appropriate with staff & peers. Patient verbalized that her somatic symptoms getting little better. Appetite and energy level good. Support and encouragement given.

## 2022-01-22 NOTE — Group Note (Signed)
[facility] LCSW Group Therapy Note   Group Date: 01/22/2022 Start Time: 1200 End Time: 1300   Type of Therapy/Topic:  Group Therapy:  Emotion Regulation  Participation Level:  Active   Mood: Euthymic   Description of Group:    The purpose of this group is to assist patients in learning to regulate negative emotions and experience positive emotions. Patients will be guided to discuss ways in which they have been vulnerable to their negative emotions. These vulnerabilities will be juxtaposed with experiences of positive emotions or situations, and patients challenged to use positive emotions to combat negative ones. Special emphasis will be placed on coping with negative emotions in conflict situations, and patients will process healthy conflict resolution skills.  Therapeutic Goals: Patient will identify two positive emotions or experiences to reflect on in order to balance out negative emotions:  Patient will label two or more emotions that they find the most difficult to experience:  Patient will be able to demonstrate positive conflict resolution skills through discussion or role plays:   Summary of Patient Progress: Patient was present for the entirety of the group session. Patient was an active listener and participated in the topic of discussion, provided helpful advice to others, and added nuance to topic of conversation. Patient helped provide examples during brainstorming activity. States she has had difficulty with grief processing, though did not provide details. At times patient was noticeably disinterested in group setting, as evidence by placing her head face down on the table. However, patient participated when called upon.     Therapeutic Modalities:   Cognitive Behavioral Therapy Feelings Identification Dialectical Behavioral Therapy   [place]y [place]s, [place]

## 2022-01-22 NOTE — Progress Notes (Signed)
[facility] MD Progress Note  01/22/2022 8:43 AM ITATY STRENGTH  MRN:  [id number] Subjective:  " anxiety". Patient alert and oriented x 4, affect is blunted, but brightens upon approach, she denies pain or discomfort at this time. Patient approached  Probation officer and asked that her room should not be opened during 15 minutes rounding that because it wakes her up and scares her but, it is okay for staff to flash the lights in her room. The tech on  the floor was advised about this and everyone agreed that for the patient's comfort to get some sleep it was best we respect her wish. Pt cooperative and with anxious mood/affect.  Pt requesting mulitiple PRN medications for anxiety, pain and nausea.  Pt stated that she is having "withdrawal" .   Patient reports feeling more anxious, depressed and shaky today. She slept alright last night, is tearful and anxious. She is denying current SI, reports that she had a clear manic episode on Paxil and denies other manic episode . She is agreeable to switching her Seroquel to Seroquel XR to help with her mood.  Principal Problem: Adjustment disorder with mixed anxiety and depressed mood Diagnosis: Principal Problem:   Adjustment disorder with mixed anxiety and depressed mood Active Problems:   Anxiety   Benzodiazepine withdrawal (HCC)   Opiate withdrawal (HCC)  Total Time spent with patient: 20 minutes  Past Psychiatric History:  Patient has a history of substance use problems including alcohol and opiates and benzodiazepines going back several years.  Has had some experience with substance abuse therapy treatment.  No past suicide attempts but had a history of self-mutilation as a child. Anxiety  Past Medical History:  Past Medical History:  Diagnosis Date   ADD (attention deficit disorder)    Anxiety    Anxiety    Chronic knee pain    Depression    PTSD (post-traumatic stress disorder)     Past Surgical History:  Procedure Laterality Date   CESAREAN SECTION      Family History:  Family History  Problem Relation Age of Onset   Hypertension Mother    Cancer Mother    Hypertension Father    Family Psychiatric  History:  Son died of overdose of opiates although she says she does not think he was a regular user Social History:  Social History   Substance and Sexual Activity  Alcohol Use Yes   Comment: socially      Social History   Substance and Sexual Activity  Drug Use No   Types: Oxycodone, Hydrocodone   Comment: Quit 04/11/2012 herion two years ago    Social History   Socioeconomic History   Marital status: Divorced    Spouse name: Not on file   Number of children: Not on file   Years of education: Not on file   Highest education level: Not on file  Occupational History   Not on file  Tobacco Use   Smoking status: Never   Smokeless tobacco: Never  Vaping Use   Vaping Use: Never used  Substance and Sexual Activity   Alcohol use: Yes    Comment: socially    Drug use: No    Types: Oxycodone, Hydrocodone    Comment: Quit 04/11/2012 herion two years ago   Sexual activity: Not Currently    Birth control/protection: None  Other Topics Concern   Not on file  Social History Narrative   Not on file   Social Determinants of Health   Financial  Resource Strain: Not on file  Food Insecurity: No Food Insecurity (01/18/2022)   Hunger Vital Sign    Worried About Running Out of Food in the Last Year: Never true    Ran Out of Food in the Last Year: Never true  Transportation Needs: Not on file  Physical Activity: Not on file  Stress: Not on file  Social Connections: Not on file   Additional Social History:                         Sleep: Good  Appetite:  Fair  Current Medications: Current Facility-Administered Medications  Medication Dose Route Frequency Provider Last Rate Last Admin   acetaminophen (TYLENOL) tablet 650 mg  650 mg Oral Q6H PRN Clapacs, John T, MD       alum & mag hydroxide-simeth  (MAALOX/MYLANTA) 200-200-20 MG/5ML suspension 30 mL  30 mL Oral Q4H PRN Clapacs, John T, MD   30 mL at 01/20/22 0622   cloNIDine (CATAPRES) tablet 0.05 mg  0.05 mg Oral Q8H PRN Clapacs, John T, MD   0.05 mg at 01/22/22 0835   dicyclomine (BENTYL) capsule 20 mg  20 mg Oral Q6H PRN Clapacs, John T, MD   20 mg at 01/21/22 1719   gabapentin (NEURONTIN) capsule 400 mg  400 mg Oral TID Danelle Earthly, Genessis Flanary   400 mg at 01/22/22 6378   hydrOXYzine (ATARAX) tablet 50 mg  50 mg Oral Q6H PRN Clapacs, Jackquline Denmark, MD   50 mg at 01/22/22 0835   loperamide (IMODIUM) capsule 2 mg  2 mg Oral PRN Clapacs, John T, MD   2 mg at 01/21/22 1719   magnesium hydroxide (MILK OF MAGNESIA) suspension 30 mL  30 mL Oral Daily PRN Clapacs, John T, MD       melatonin tablet 2.5 mg  2.5 mg Oral QHS Clapacs, John T, MD   2.5 mg at 01/21/22 2130   methocarbamol (ROBAXIN) tablet 500 mg  500 mg Oral Q8H PRN Clapacs, John T, MD   500 mg at 01/21/22 0523   naproxen (NAPROSYN) tablet 500 mg  500 mg Oral BID PRN Clapacs, John T, MD   500 mg at 01/22/22 0835   OLANZapine zydis (ZYPREXA) disintegrating tablet 5 mg  5 mg Oral Q8H PRN Clapacs, John T, MD   5 mg at 01/21/22 2131   ondansetron (ZOFRAN-ODT) disintegrating tablet 4 mg  4 mg Oral Q8H PRN Keyra Virella   4 mg at 01/22/22 0618   QUEtiapine (SEROQUEL) tablet 100 mg  100 mg Oral QHS Zyan Coby   100 mg at 01/21/22 2130    Lab Results: No results found for this or any previous visit (from the past 48 hour(s)).  Blood Alcohol level:  Lab Results  Component Value Date   ETH <10 01/13/2022   ETH 96 (H) 12/11/2013    Metabolic Disorder Labs: Lab Results  Component Value Date   HGBA1C 5.4 01/19/2022   MPG 108.28 01/19/2022   MPG 108.28 01/13/2022   Lab Results  Component Value Date   PROLACTIN 22.8 01/13/2022   Lab Results  Component Value Date   CHOL 229 (H) 01/19/2022   TRIG 154 (H) 01/19/2022   HDL 43 01/19/2022   CHOLHDL 5.3 01/19/2022   VLDL 31 01/19/2022   LDLCALC 155  (H) 01/19/2022   LDLCALC 129 (H) 01/13/2022    Physical Findings: AIMS: Facial and Oral Movements Muscles of Facial Expression: None, normal Lips and Perioral Area: None,  normal Jaw: None, normal Tongue: None, normal,Extremity Movements Upper (arms, wrists, hands, fingers): None, normal Lower (legs, knees, ankles, toes): None, normal, Trunk Movements Neck, shoulders, hips: None, normal, Overall Severity Severity of abnormal movements (highest score from questions above): None, normal Incapacitation due to abnormal movements: None, normal Patient's awareness of abnormal movements (rate only patient's report): No Awareness, Dental Status Current problems with teeth and/or dentures?: No Does patient usually wear dentures?: No  CIWA:    COWS:     Musculoskeletal: Strength & Muscle Tone: within normal limits Gait & Station: normal Patient leans: Right  Psychiatric Specialty Exam:  Presentation  General Appearance: Casual; Appropriate for Environment  Eye Contact:Good  Speech:Clear and Coherent; Normal Rate  Speech Volume:Normal  Handedness:Right   Mood and Affect  Mood:Anxious; Depressed; Hopeless  Affect:Tearful; Depressed; Congruent   Thought Process  Thought Processes:Coherent  Descriptions of Associations:Circumstantial  Orientation:Full (Time, Place and Person)  Thought Content:Logical  History of Schizophrenia/Schizoaffective disorder:No  Duration of Psychotic Symptoms:No data recorded Hallucinations:No data recorded Ideas of Reference:None  Suicidal Thoughts:No data recorded Homicidal Thoughts:No data recorded  Sensorium  Memory:Immediate Good; Recent Good; Remote Good  Judgment:Good  Insight:Good   Executive Functions  Concentration:Good  Attention Span:Good  Recall:Good  Fund of Knowledge:Good  Language:Good   Psychomotor Activity  Psychomotor Activity:No data recorded  Assets  Assets:Communication Skills; Desire for  Improvement; Financial Resources/Insurance; Housing; Resilience; Physical Health; Social Support   Sleep  Sleep:No data recorded   Physical Exam: Physical Exam ROS Blood pressure 123/61, pulse 75, temperature 98.6 F (37 C), temperature source Oral, resp. rate 15, height 5\' 3"  (1.6 m), weight 96.2 kg, last menstrual period 01/07/2022, SpO2 97 %. Body mass index is 37.55 kg/m.   Treatment Plan Summary: Daily contact with patient to assess and evaluate symptoms and progress in treatment, Medication management, and Plan : Switch Seroquel to Seroquel XR.   01/09/2022 01/22/2022, 8:43 AM

## 2022-01-23 DIAGNOSIS — F4323 Adjustment disorder with mixed anxiety and depressed mood: Secondary | ICD-10-CM | POA: Diagnosis not present

## 2022-01-23 MED ORDER — QUETIAPINE FUMARATE ER 200 MG PO TB24
200.0000 mg | ORAL_TABLET | Freq: Every day | ORAL | Status: DC
Start: 2022-01-23 — End: 2022-01-24
  Administered 2022-01-23: 200 mg via ORAL
  Filled 2022-01-23: qty 1

## 2022-01-23 NOTE — Progress Notes (Signed)
Patient calm and pleasant during assessment denying SI/HI/AVH. Pt endorses anxiety. Pt observed interacting appropriately with staff and peers on the unit. Pt compliant with medication administration per MD orders. Pt given education, support, and encouragement to be active in his treatment plan. Pt being monitored Q 15 minutes for safety per unit protocol.  

## 2022-01-23 NOTE — Progress Notes (Signed)
Patient refused scheduled afternoon Gabapentin. MD notified, via secure chat.

## 2022-01-23 NOTE — [facility] Group Notes (Signed)
[facility] Group Notes:  (Nursing/MHT/Case Management/Adjunct)  Date:  01/23/2022  Time:  10:54 PM  Type of Therapy:  Group Therapy Wrap up   Participation Level:  Active  Participation Quality:  Appropriate and Attentive  Affect:  Appropriate and Tearful  Cognitive:  Alert, Appropriate, and Oriented  Insight:  Appropriate  Engagement in Group:  Engaged and Supportive  Modes of Intervention:  Discussion  Summary of Progress/Problems: Pt shared history and past experiences. Pt expressed desire to make a positive change. Pt said she was planning to live with her oldest son in a different state after discharge. She wants to be closer to him and her granddaughters.   Maglione,Helia Haese E 01/23/2022, 10:54 PM

## 2022-01-23 NOTE — Progress Notes (Signed)
[facility] MD Progress Note  01/23/2022 3:21 PM Heidi Pearson  MRN:  [id number] Subjective: Follow-up 46 year old woman with anxiety and substance withdrawal.  Patient still complaining of feeling uncomfortable and very anxious.  Nevertheless looking forward to being discharged within the next couple days.  Denies any current suicidal thoughts. Principal Problem: Adjustment disorder with mixed anxiety and depressed mood Diagnosis: Principal Problem:   Adjustment disorder with mixed anxiety and depressed mood Active Problems:   Anxiety   Benzodiazepine withdrawal (HCC)   Opiate withdrawal (HCC)  Total Time spent with patient: 30 minutes  Past Psychiatric History: Past history of substance use anxiety and depression  Past Medical History:  Past Medical History:  Diagnosis Date   ADD (attention deficit disorder)    Anxiety    Anxiety    Chronic knee pain    Depression    PTSD (post-traumatic stress disorder)     Past Surgical History:  Procedure Laterality Date   CESAREAN SECTION     Family History:  Family History  Problem Relation Age of Onset   Hypertension Mother    Cancer Mother    Hypertension Father    Family Psychiatric  History: See previous Social History:  Social History   Substance and Sexual Activity  Alcohol Use Yes   Comment: socially      Social History   Substance and Sexual Activity  Drug Use No   Types: Oxycodone, Hydrocodone   Comment: Quit 04/11/2012 herion two years ago    Social History   Socioeconomic History   Marital status: Divorced    Spouse name: Not on file   Number of children: Not on file   Years of education: Not on file   Highest education level: Not on file  Occupational History   Not on file  Tobacco Use   Smoking status: Never   Smokeless tobacco: Never  Vaping Use   Vaping Use: Never used  Substance and Sexual Activity   Alcohol use: Yes    Comment: socially    Drug use: No    Types: Oxycodone, Hydrocodone     Comment: Quit 04/11/2012 herion two years ago   Sexual activity: Not Currently    Birth control/protection: None  Other Topics Concern   Not on file  Social History Narrative   Not on file   Social Determinants of Health   Financial Resource Strain: Not on file  Food Insecurity: No Food Insecurity (01/18/2022)   Hunger Vital Sign    Worried About Running Out of Food in the Last Year: Never true    Ran Out of Food in the Last Year: Never true  Transportation Needs: Not on file  Physical Activity: Not on file  Stress: Not on file  Social Connections: Not on file   Additional Social History:                         Sleep: Fair  Appetite:  Fair  Current Medications: Current Facility-Administered Medications  Medication Dose Route Frequency Provider Last Rate Last Admin   acetaminophen (TYLENOL) tablet 650 mg  650 mg Oral Q6H PRN Gleason Ardoin T, MD   650 mg at 01/23/22 1225   alum & mag hydroxide-simeth (MAALOX/MYLANTA) 200-200-20 MG/5ML suspension 30 mL  30 mL Oral Q4H PRN Suzzanne Brunkhorst T, MD   30 mL at 01/20/22 0622   cloNIDine (CATAPRES) tablet 0.05 mg  0.05 mg Oral Q8H PRN Dhruvan Gullion, Jackquline Denmark, MD   0.05  mg at 01/23/22 0831   dicyclomine (BENTYL) capsule 20 mg  20 mg Oral Q6H PRN Trapper Meech, Jackquline Denmark, MD   20 mg at 01/23/22 0831   gabapentin (NEURONTIN) capsule 400 mg  400 mg Oral TID Geeta Dworkin, Jackquline Denmark, MD   400 mg at 01/23/22 0831   hydrOXYzine (ATARAX) tablet 50 mg  50 mg Oral Q6H PRN Melenda Bielak, Jackquline Denmark, MD   50 mg at 01/23/22 1225   loperamide (IMODIUM) capsule 2 mg  2 mg Oral PRN Eiley Mcginnity, Jackquline Denmark, MD   2 mg at 01/21/22 1719   magnesium hydroxide (MILK OF MAGNESIA) suspension 30 mL  30 mL Oral Daily PRN Jailani Hogans T, MD       melatonin tablet 2.5 mg  2.5 mg Oral QHS Yola Paradiso T, MD   2.5 mg at 01/22/22 2125   methocarbamol (ROBAXIN) tablet 500 mg  500 mg Oral Q8H PRN Scotty Pinder T, MD   500 mg at 01/21/22 0523   naproxen (NAPROSYN) tablet 500 mg  500 mg Oral BID PRN  Mehkai Gallo T, MD   500 mg at 01/23/22 0831   OLANZapine zydis (ZYPREXA) disintegrating tablet 5 mg  5 mg Oral Q8H PRN Jacobs Golab T, MD   5 mg at 01/23/22 0831   ondansetron (ZOFRAN-ODT) disintegrating tablet 4 mg  4 mg Oral Q8H PRN Malik, Kashif   4 mg at 01/23/22 0455   QUEtiapine (SEROQUEL XR) 24 hr tablet 200 mg  200 mg Oral QHS Paizleigh Wilds T, MD        Lab Results: No results found for this or any previous visit (from the past 48 hour(s)).  Blood Alcohol level:  Lab Results  Component Value Date   ETH <10 01/13/2022   ETH 96 (H) 12/11/2013    Metabolic Disorder Labs: Lab Results  Component Value Date   HGBA1C 5.4 01/19/2022   MPG 108.28 01/19/2022   MPG 108.28 01/13/2022   Lab Results  Component Value Date   PROLACTIN 22.8 01/13/2022   Lab Results  Component Value Date   CHOL 229 (H) 01/19/2022   TRIG 154 (H) 01/19/2022   HDL 43 01/19/2022   CHOLHDL 5.3 01/19/2022   VLDL 31 01/19/2022   LDLCALC 155 (H) 01/19/2022   LDLCALC 129 (H) 01/13/2022    Physical Findings: AIMS: Facial and Oral Movements Muscles of Facial Expression: None, normal Lips and Perioral Area: None, normal Jaw: None, normal Tongue: None, normal,Extremity Movements Upper (arms, wrists, hands, fingers): None, normal Lower (legs, knees, ankles, toes): None, normal, Trunk Movements Neck, shoulders, hips: None, normal, Overall Severity Severity of abnormal movements (highest score from questions above): None, normal Incapacitation due to abnormal movements: None, normal Patient's awareness of abnormal movements (rate only patient's report): No Awareness, Dental Status Current problems with teeth and/or dentures?: No Does patient usually wear dentures?: No  CIWA:    COWS:     Musculoskeletal: Strength & Muscle Tone: within normal limits Gait & Station: normal Patient leans: N/A  Psychiatric Specialty Exam:  Presentation  General Appearance: Casual; Appropriate for Environment  Eye  Contact:Good  Speech:Clear and Coherent; Normal Rate  Speech Volume:Normal  Handedness:Right   Mood and Affect  Mood:Anxious; Depressed; Hopeless  Affect:Tearful; Depressed; Congruent   Thought Process  Thought Processes:Coherent  Descriptions of Associations:Circumstantial  Orientation:Full (Time, Place and Person)  Thought Content:Logical  History of Schizophrenia/Schizoaffective disorder:No  Duration of Psychotic Symptoms:No data recorded Hallucinations:No data recorded Ideas of Reference:None  Suicidal Thoughts:No data recorded Homicidal Thoughts:No  data recorded  Sensorium  Memory:Immediate Good; Recent Good; Remote Good  Judgment:Good  Insight:Good   Executive Functions  Concentration:Good  Attention Span:Good  Recall:Good  Fund of Knowledge:Good  Language:Good   Psychomotor Activity  Psychomotor Activity:No data recorded  Assets  Assets:Communication Skills; Desire for Improvement; Financial Resources/Insurance; Housing; Resilience; Physical Health; Social Support   Sleep  Sleep:No data recorded   Physical Exam: Physical Exam Vitals and nursing note reviewed.  Constitutional:      Appearance: Normal appearance.  HENT:     Head: Normocephalic and atraumatic.     Mouth/Throat:     Pharynx: Oropharynx is clear.  Eyes:     Pupils: Pupils are equal, round, and reactive to light.  Cardiovascular:     Rate and Rhythm: Normal rate and regular rhythm.  Pulmonary:     Effort: Pulmonary effort is normal.     Breath sounds: Normal breath sounds.  Abdominal:     General: Abdomen is flat.     Palpations: Abdomen is soft.  Musculoskeletal:        General: Normal range of motion.  Skin:    General: Skin is warm and dry.  Neurological:     General: No focal deficit present.     Mental Status: She is alert. Mental status is at baseline.  Psychiatric:        Attention and Perception: Attention normal.        Mood and Affect: Mood is  anxious.        Speech: Speech normal.        Behavior: Behavior is cooperative.        Thought Content: Thought content normal.        Cognition and Memory: Cognition normal.    Review of Systems  Constitutional: Negative.   HENT: Negative.    Eyes: Negative.   Respiratory: Negative.    Cardiovascular: Negative.   Gastrointestinal: Negative.   Musculoskeletal: Negative.   Skin: Negative.   Neurological: Negative.   Psychiatric/Behavioral: Negative.     Blood pressure (!) 143/80, pulse 75, temperature 98.6 F (37 C), temperature source Oral, resp. rate 15, height 5\' 3"  (1.6 m), weight 96.2 kg, last menstrual period 01/07/2022, SpO2 97 %. Body mass index is 37.55 kg/m.   Treatment Plan Summary: Medication management and Plan   continue current medication.  Encourage patient to be attending groups and interacting with others on the unit.  Likely discharge next 1 to 2 days.  01/09/2022, MD 01/23/2022, 3:21 PM

## 2022-01-23 NOTE — Group Note (Signed)
[facility] LCSW Group Therapy Note    Group Date: 01/23/2022 Start Time: 1300 End Time: 1400  Type of Therapy and Topic:  Group Therapy:  Overcoming Obstacles  Participation Level:  [facility] PARTICIPATION LEVEL: Active   Description of Group:   In this group patients will be encouraged to explore what they see as obstacles to their own wellness and recovery. They will be guided to discuss their thoughts, feelings, and behaviors related to these obstacles. The group will process together ways to cope with barriers, with attention given to specific choices patients can make. Each patient will be challenged to identify changes they are motivated to make in order to overcome their obstacles. This group will be process-oriented, with patients participating in exploration of their own experiences as well as giving and receiving support and challenge from other group members.  Therapeutic Goals: 1. Patient will identify personal and current obstacles as they relate to admission. 2. Patient will identify barriers that currently interfere with their wellness or overcoming obstacles.  3. Patient will identify feelings, thought process and behaviors related to these barriers. 4. Patient will identify two changes they are willing to make to overcome these obstacles:    Summary of Patient Progress Patient was present for the entirety of the group discussion. She was minimally involved but appeared to attend to the discussion. Her comments, when made, were pertinent to the discussion.    Therapeutic Modalities:   Cognitive Behavioral Therapy Solution Focused Therapy Motivational Interviewing Relapse Prevention Therapy   Glenis Smoker, LCSW

## 2022-01-23 NOTE — Progress Notes (Signed)
Recreation Therapy Notes  Date: 01/23/2022  Time: 10:30am   Location: Craft room   Behavioral response: Appropriate    Intervention Topic: Problem-Solving   Discussion/Intervention:  Group content on today was focused on problem solving. The group described what problem solving is. Patients expressed how problems affect them and how they deal with problems. Individuals identified healthy ways to deal with problems. Patients explained what normally happens to them when they do not deal with problems. The group expressed reoccurring problems for them. The group participated in the intervention "Ways to Solve problems" where patients were given a chance to explore different ways to solve problems.   Clinical Observations/Feedback:  Patient came to group and defined problem-solving as trying to find a solution. She stated that she normally problem solves by thinking about the consequences. Patient expressed that if you do not deal with your problems they will build up and get bigger. Participant explained that she could improve her problem-solving skills by not procrastinating as much. Individual was social with peers and staff during intervention.  Heidi Pearson LRT/CTRS            Kaelei Wheeler 01/23/2022 12:24 PM

## 2022-01-23 NOTE — Progress Notes (Signed)
No distress noted, 15 minutes safety checks maintained will continue to monitor closer denies SI/HI/AVH, interacting appropriately will continue to monitor

## 2022-01-23 NOTE — Plan of Care (Signed)
D- Patient alert and oriented. Patient presented in an anxious, but pleasant mood on assessment stating that she slept "on and off" last night and had complaints of stomach ache, withdrawal symptoms, and a headache. Patient requested PRN medication for these issues, as well as requested Zyprexa from this writer. Patient endorsed both depression/anxiety, stating "just me thinking about the past too much. I want to just feel better, that's what gets me down". Patient denied SI, HI, AVH at this time. Patient had no stated goals for today.  A- Scheduled medications administered to patient, per MD orders. Support and encouragement provided.  Routine safety checks conducted every 15 minutes.  Patient informed to notify staff with problems or concerns.  R- No adverse drug reactions noted. Patient contracts for safety at this time. Patient compliant with medications and treatment plan. Patient receptive, calm, and cooperative. Patient interacts well with others on the unit.  Patient remains safe at this time.  Problem: Education: Goal: Knowledge of General Education information will improve Description: Including pain rating scale, medication(s)/side effects and non-pharmacologic comfort measures Outcome: Progressing   Problem: Health Behavior/Discharge Planning: Goal: Ability to manage health-related needs will improve Outcome: Progressing   Problem: Clinical Measurements: Goal: Ability to maintain clinical measurements within normal limits will improve Outcome: Progressing Goal: Will remain free from infection Outcome: Progressing Goal: Diagnostic test results will improve Outcome: Progressing Goal: Respiratory complications will improve Outcome: Progressing Goal: Cardiovascular complication will be avoided Outcome: Progressing   Problem: Activity: Goal: Risk for activity intolerance will decrease Outcome: Progressing   Problem: Nutrition: Goal: Adequate nutrition will be  maintained Outcome: Progressing   Problem: Coping: Goal: Level of anxiety will decrease Outcome: Progressing   Problem: Elimination: Goal: Will not experience complications related to bowel motility Outcome: Progressing Goal: Will not experience complications related to urinary retention Outcome: Progressing   Problem: Pain Managment: Goal: General experience of comfort will improve Outcome: Progressing   Problem: Safety: Goal: Ability to remain free from injury will improve Outcome: Progressing   Problem: Skin Integrity: Goal: Risk for impaired skin integrity will decrease Outcome: Progressing   Problem: Education: Goal: Knowledge of [place] General Education information/materials will improve Outcome: Progressing Goal: Emotional status will improve Outcome: Progressing Goal: Mental status will improve Outcome: Progressing Goal: Verbalization of understanding the information provided will improve Outcome: Progressing   Problem: Health Behavior/Discharge Planning: Goal: Identification of resources available to assist in meeting health care needs will improve Outcome: Progressing Goal: Compliance with treatment plan for underlying cause of condition will improve Outcome: Progressing   Problem: Education: Goal: Utilization of techniques to improve thought processes will improve Outcome: Progressing Goal: Knowledge of the prescribed therapeutic regimen will improve Outcome: Progressing   Problem: Activity: Goal: Interest or engagement in leisure activities will improve Outcome: Progressing Goal: Imbalance in normal sleep/wake cycle will improve Outcome: Progressing   Problem: Coping: Goal: Coping ability will improve Outcome: Progressing Goal: Will verbalize feelings Outcome: Progressing   Problem: Role Relationship: Goal: Will demonstrate positive changes in social behaviors and relationships Outcome: Progressing   Problem: Safety: Goal: Ability to  disclose and discuss suicidal ideas will improve Outcome: Progressing Goal: Ability to identify and utilize support systems that promote safety will improve Outcome: Progressing   Problem: Self-Concept: Goal: Will verbalize positive feelings about self Outcome: Progressing Goal: Level of anxiety will decrease Outcome: Progressing

## 2022-01-24 DIAGNOSIS — F4323 Adjustment disorder with mixed anxiety and depressed mood: Secondary | ICD-10-CM | POA: Diagnosis not present

## 2022-01-24 MED ORDER — QUETIAPINE FUMARATE 200 MG PO TABS
200.0000 mg | ORAL_TABLET | Freq: Every day | ORAL | Status: DC
  Administered 2022-01-24: 200 mg via ORAL
  Filled 2022-01-24: qty 1

## 2022-01-24 NOTE — Progress Notes (Signed)
[facility] MD Progress Note  01/24/2022 10:11 AM Heidi Pearson  MRN:  [id number] Subjective: Patient seen and chart reviewed.  Patient continues to complain of severe subjective anxiety.  Also complains of not sleeping last night.  We had been open to discussing discharge plans today but the patient says she feels too anxious and does not feel comfortable yet with discharge.  She feels switching back to the regular Seroquel would be an improvement Principal Problem: Adjustment disorder with mixed anxiety and depressed mood Diagnosis: Principal Problem:   Adjustment disorder with mixed anxiety and depressed mood Active Problems:   Anxiety   Benzodiazepine withdrawal (HCC)   Opiate withdrawal (HCC)  Total Time spent with patient: 30 minutes  Past Psychiatric History: Patient has a history of substance use anxiety and depression  Past Medical History:  Past Medical History:  Diagnosis Date   ADD (attention deficit disorder)    Anxiety    Anxiety    Chronic knee pain    Depression    PTSD (post-traumatic stress disorder)     Past Surgical History:  Procedure Laterality Date   CESAREAN SECTION     Family History:  Family History  Problem Relation Age of Onset   Hypertension Mother    Cancer Mother    Hypertension Father    Family Psychiatric  History: See previous Social History:  Social History   Substance and Sexual Activity  Alcohol Use Yes   Comment: socially      Social History   Substance and Sexual Activity  Drug Use No   Types: Oxycodone, Hydrocodone   Comment: Quit 04/11/2012 herion two years ago    Social History   Socioeconomic History   Marital status: Divorced    Spouse name: Not on file   Number of children: Not on file   Years of education: Not on file   Highest education level: Not on file  Occupational History   Not on file  Tobacco Use   Smoking status: Never   Smokeless tobacco: Never  Vaping Use   Vaping Use: Never used  Substance and  Sexual Activity   Alcohol use: Yes    Comment: socially    Drug use: No    Types: Oxycodone, Hydrocodone    Comment: Quit 04/11/2012 herion two years ago   Sexual activity: Not Currently    Birth control/protection: None  Other Topics Concern   Not on file  Social History Narrative   Not on file   Social Determinants of Health   Financial Resource Strain: Not on file  Food Insecurity: No Food Insecurity (01/18/2022)   Hunger Vital Sign    Worried About Running Out of Food in the Last Year: Never true    Ran Out of Food in the Last Year: Never true  Transportation Needs: Not on file  Physical Activity: Not on file  Stress: Not on file  Social Connections: Not on file   Additional Social History:                         Sleep: Poor  Appetite:  Fair  Current Medications: Current Facility-Administered Medications  Medication Dose Route Frequency Provider Last Rate Last Admin   acetaminophen (TYLENOL) tablet 650 mg  650 mg Oral Q6H PRN Huxton Glaus, Jackquline Denmark, MD   650 mg at 01/23/22 1225   alum & mag hydroxide-simeth (MAALOX/MYLANTA) 200-200-20 MG/5ML suspension 30 mL  30 mL Oral Q4H PRN Vyron Fronczak, Jackquline Denmark, MD  30 mL at 01/24/22 0736   cloNIDine (CATAPRES) tablet 0.05 mg  0.05 mg Oral Q8H PRN Annamaria Salah T, MD   0.05 mg at 01/24/22 0855   dicyclomine (BENTYL) capsule 20 mg  20 mg Oral Q6H PRN Tyrea Froberg, Jackquline Denmark, MD   20 mg at 01/24/22 0248   hydrOXYzine (ATARAX) tablet 50 mg  50 mg Oral Q6H PRN Aster Screws, Jackquline Denmark, MD   50 mg at 01/24/22 0855   loperamide (IMODIUM) capsule 2 mg  2 mg Oral PRN Mel Tadros T, MD   2 mg at 01/21/22 1719   magnesium hydroxide (MILK OF MAGNESIA) suspension 30 mL  30 mL Oral Daily PRN Yannet Rincon T, MD       melatonin tablet 2.5 mg  2.5 mg Oral QHS Asharia Lotter T, MD   2.5 mg at 01/23/22 2113   methocarbamol (ROBAXIN) tablet 500 mg  500 mg Oral Q8H PRN Malacai Grantz T, MD   500 mg at 01/24/22 0248   naproxen (NAPROSYN) tablet 500 mg  500 mg Oral BID  PRN Euell Schiff T, MD   500 mg at 01/23/22 0831   OLANZapine zydis (ZYPREXA) disintegrating tablet 5 mg  5 mg Oral Q8H PRN Riham Polyakov, Jackquline Denmark, MD   5 mg at 01/24/22 0928   ondansetron (ZOFRAN-ODT) disintegrating tablet 4 mg  4 mg Oral Q8H PRN Malik, Kashif   4 mg at 01/24/22 0109   QUEtiapine (SEROQUEL) tablet 200 mg  200 mg Oral QHS Jarnell Cordaro T, MD        Lab Results: No results found for this or any previous visit (from the past 48 hour(s)).  Blood Alcohol level:  Lab Results  Component Value Date   ETH <10 01/13/2022   ETH 96 (H) 12/11/2013    Metabolic Disorder Labs: Lab Results  Component Value Date   HGBA1C 5.4 01/19/2022   MPG 108.28 01/19/2022   MPG 108.28 01/13/2022   Lab Results  Component Value Date   PROLACTIN 22.8 01/13/2022   Lab Results  Component Value Date   CHOL 229 (H) 01/19/2022   TRIG 154 (H) 01/19/2022   HDL 43 01/19/2022   CHOLHDL 5.3 01/19/2022   VLDL 31 01/19/2022   LDLCALC 155 (H) 01/19/2022   LDLCALC 129 (H) 01/13/2022    Physical Findings: AIMS: Facial and Oral Movements Muscles of Facial Expression: None, normal Lips and Perioral Area: None, normal Jaw: None, normal Tongue: None, normal,Extremity Movements Upper (arms, wrists, hands, fingers): None, normal Lower (legs, knees, ankles, toes): None, normal, Trunk Movements Neck, shoulders, hips: None, normal, Overall Severity Severity of abnormal movements (highest score from questions above): None, normal Incapacitation due to abnormal movements: None, normal Patient's awareness of abnormal movements (rate only patient's report): No Awareness, Dental Status Current problems with teeth and/or dentures?: No Does patient usually wear dentures?: No  CIWA:    COWS:     Musculoskeletal: Strength & Muscle Tone: within normal limits Gait & Station: normal Patient leans: N/A  Psychiatric Specialty Exam:  Presentation  General Appearance: Casual; Appropriate for Environment  Eye  Contact:Good  Speech:Clear and Coherent; Normal Rate  Speech Volume:Normal  Handedness:Right   Mood and Affect  Mood:Anxious; Depressed; Hopeless  Affect:Tearful; Depressed; Congruent   Thought Process  Thought Processes:Coherent  Descriptions of Associations:Circumstantial  Orientation:Full (Time, Place and Person)  Thought Content:Logical  History of Schizophrenia/Schizoaffective disorder:No  Duration of Psychotic Symptoms:No data recorded Hallucinations:No data recorded Ideas of Reference:None  Suicidal Thoughts:No data recorded Homicidal Thoughts:No data  recorded  Sensorium  Memory:Immediate Good; Recent Good; Remote Good  Judgment:Good  Insight:Good   Executive Functions  Concentration:Good  Attention Span:Good  Recall:Good  Fund of Knowledge:Good  Language:Good   Psychomotor Activity  Psychomotor Activity:No data recorded  Assets  Assets:Communication Skills; Desire for Improvement; Financial Resources/Insurance; Housing; Resilience; Physical Health; Social Support   Sleep  Sleep:No data recorded   Physical Exam: Physical Exam Vitals and nursing note reviewed.  Constitutional:      Appearance: Normal appearance.  HENT:     Head: Normocephalic and atraumatic.     Mouth/Throat:     Pharynx: Oropharynx is clear.  Eyes:     Pupils: Pupils are equal, round, and reactive to light.  Cardiovascular:     Rate and Rhythm: Normal rate and regular rhythm.  Pulmonary:     Effort: Pulmonary effort is normal.     Breath sounds: Normal breath sounds.  Abdominal:     General: Abdomen is flat.     Palpations: Abdomen is soft.  Musculoskeletal:        General: Normal range of motion.  Skin:    General: Skin is warm and dry.  Neurological:     General: No focal deficit present.     Mental Status: She is alert. Mental status is at baseline.  Psychiatric:        Attention and Perception: Attention normal.        Mood and Affect: Mood normal.         Speech: Speech normal.        Behavior: Behavior normal.        Thought Content: Thought content normal.        Cognition and Memory: Cognition normal.        Judgment: Judgment normal.    Review of Systems  Constitutional: Negative.   HENT: Negative.    Eyes: Negative.   Respiratory: Negative.    Cardiovascular: Negative.   Gastrointestinal: Negative.   Musculoskeletal: Negative.   Skin: Negative.   Neurological: Negative.   Psychiatric/Behavioral:  The patient is nervous/anxious and has insomnia.    Blood pressure 136/75, pulse 77, temperature 99 F (37.2 C), temperature source Oral, resp. rate 20, height 5\' 3"  (1.6 m), weight 96.2 kg, last menstrual period 01/07/2022, SpO2 97 %. Body mass index is 37.55 kg/m.   Treatment Plan Summary: Medication management and Plan at her suggestion and in consultation with nursing I will be switching her from the extended release quetiapine back to playing quetiapine 200 mg at night.  Patient agrees that she will be open to discharge tomorrow.  01/09/2022, MD 01/24/2022, 10:11 AM

## 2022-01-24 NOTE — Progress Notes (Signed)
Patient calm and pleasant during assessment denying SI/HI/AVH. Pt endorses anxiety. Pt observed interacting appropriately with staff and peers on the unit. Pt compliant with medication administration per MD orders. Pt given education, support, and encouragement to be active in his treatment plan. Pt being monitored Q 15 minutes for safety per unit protocol.  

## 2022-01-24 NOTE — Progress Notes (Signed)
Recreation Therapy Notes   Date: 01/24/2022   Time: 10:30 am    Location: Courtyard    Behavioral response: Appropriate   Intervention Topic: Wellness     Discussion/Intervention:  Group content today was focused on Wellness. The group defined wellness and some positive ways they make decisions for themselves. Individuals expressed reasons why they neglected any wellness in the past. Patients described ways to improve wellness skills in the future. The group explained what could happen if they did not do any wellness at all. Participants express how bad choices has affected them and others around them. Individual explained the importance of wellness. The group participated in the intervention "Testing my Wellness" where they had a chance to identify some of their weaknesses and strengths in wellness.  Clinical Observations/Feedback: Patient came to group and was able to explore and identify wellness activities that they can use outside of the hospital. Individual was social with peers and staff while participating in the intervention.   Gerda Yin LRT/CTRS             Kimarie Coor 01/24/2022 12:30 PM

## 2022-01-24 NOTE — Group Note (Signed)
[facility] LCSW Group Therapy Note   Group Date: 01/24/2022 Start Time: 1300 End Time: 1400  Type of Therapy/Topic:  Group Therapy:  Feelings about Diagnosis  Participation Level:  Active    Description of Group:    This group will allow patients to explore their thoughts and feelings about diagnoses they have received. Patients will be guided to explore their level of understanding and acceptance of these diagnoses. Facilitator will encourage patients to process their thoughts and feelings about the reactions of others to their diagnosis, and will guide patients in identifying ways to discuss their diagnosis with significant others in their lives. This group will be process-oriented, with patients participating in exploration of their own experiences as well as giving and receiving support and challenge from other group members.   Therapeutic Goals: 1. Patient will demonstrate understanding of diagnosis as evidence by identifying two or more symptoms of the disorder:  2. Patient will be able to express two feelings regarding the diagnosis 3. Patient will demonstrate ability to communicate their needs through discussion and/or role plays  Summary of Patient Progress: Patient was present for the entirety of the group process. She stated that she already kind of knew her diagnosis so it was not a surprise for her. She stated that she agrees with it and that her support became more supportive after her diagnosis. Pt stated that knowing one's diagnosis can provide them a form of relief because they may not know what is going on with them and the diagnosis helps with that. Pt was appropriate. She appeared open and receptive to feedback/comments from both peers and facilitator.    Therapeutic Modalities:   Cognitive Behavioral Therapy Brief Therapy Feelings Identification    Glenis Smoker, LCSW

## 2022-01-24 NOTE — Plan of Care (Signed)
D- Patient alert and oriented. Patient presented in a pleasant mood on assessment reporting that she slept poor last night. Patient stated that she needs her Seroquel adjusted at nights. Patient also continues to endorse a headache, rating her pain level a "4/10", however, she did not request any pain medication from this Clinical research associate. Patient continues to endorse hopelessness, depression, and anxiety on her self-inventory, stating that her mind is racing, "the most random thoughts", and "if I could get some sleep, I'd be much better". Patient denied SI, HI, AVH at this time. Patient's goal for today is "getting my meds right", in which "speaking with the doctor", will help her achieve her goal.  A- Scheduled medications administered to patient, per MD orders. Support and encouragement provided.  Routine safety checks conducted every 15 minutes.  Patient informed to notify staff with problems or concerns.  R- No adverse drug reactions noted. Patient contracts for safety at this time. Patient compliant with medications and treatment plan. Patient receptive, calm, and cooperative. Patient interacts well with others on the unit.  Patient remains safe at this time.  Problem: Education: Goal: Knowledge of General Education information will improve Description: Including pain rating scale, medication(s)/side effects and non-pharmacologic comfort measures Outcome: Progressing   Problem: Health Behavior/Discharge Planning: Goal: Ability to manage health-related needs will improve Outcome: Progressing   Problem: Clinical Measurements: Goal: Ability to maintain clinical measurements within normal limits will improve Outcome: Progressing Goal: Will remain free from infection Outcome: Progressing Goal: Diagnostic test results will improve Outcome: Progressing Goal: Respiratory complications will improve Outcome: Progressing Goal: Cardiovascular complication will be avoided Outcome: Progressing   Problem:  Activity: Goal: Risk for activity intolerance will decrease Outcome: Progressing   Problem: Nutrition: Goal: Adequate nutrition will be maintained Outcome: Progressing   Problem: Coping: Goal: Level of anxiety will decrease Outcome: Progressing   Problem: Elimination: Goal: Will not experience complications related to bowel motility Outcome: Progressing Goal: Will not experience complications related to urinary retention Outcome: Progressing   Problem: Pain Managment: Goal: General experience of comfort will improve Outcome: Progressing   Problem: Safety: Goal: Ability to remain free from injury will improve Outcome: Progressing   Problem: Skin Integrity: Goal: Risk for impaired skin integrity will decrease Outcome: Progressing   Problem: Education: Goal: Knowledge of [place] General Education information/materials will improve Outcome: Progressing Goal: Emotional status will improve Outcome: Progressing Goal: Mental status will improve Outcome: Progressing Goal: Verbalization of understanding the information provided will improve Outcome: Progressing   Problem: Health Behavior/Discharge Planning: Goal: Identification of resources available to assist in meeting health care needs will improve Outcome: Progressing Goal: Compliance with treatment plan for underlying cause of condition will improve Outcome: Progressing   Problem: Education: Goal: Utilization of techniques to improve thought processes will improve Outcome: Progressing Goal: Knowledge of the prescribed therapeutic regimen will improve Outcome: Progressing   Problem: Activity: Goal: Interest or engagement in leisure activities will improve Outcome: Progressing Goal: Imbalance in normal sleep/wake cycle will improve Outcome: Progressing   Problem: Coping: Goal: Coping ability will improve Outcome: Progressing Goal: Will verbalize feelings Outcome: Progressing   Problem: Role  Relationship: Goal: Will demonstrate positive changes in social behaviors and relationships Outcome: Progressing   Problem: Safety: Goal: Ability to disclose and discuss suicidal ideas will improve Outcome: Progressing Goal: Ability to identify and utilize support systems that promote safety will improve Outcome: Progressing   Problem: Self-Concept: Goal: Will verbalize positive feelings about self Outcome: Progressing Goal: Level of anxiety will decrease Outcome:  Progressing

## 2022-01-24 NOTE — [facility] Group Notes (Signed)
[facility] Group Notes:  (Nursing/MHT/Case Management/Adjunct)  Date:  01/24/2022  Time:  9:50 AM  Type of Therapy:   community meeting  Participation Level:  Active  Participation Quality:  Appropriate  Affect:  Appropriate  Cognitive:  Appropriate  Insight:  Appropriate  Engagement in Group:  Engaged  Modes of Intervention:  Education  Summary of Progress/Problems:  Heidi Pearson 01/24/2022, 9:50 AM

## 2022-01-25 ENCOUNTER — Other Ambulatory Visit: Payer: Self-pay

## 2022-01-25 DIAGNOSIS — F4323 Adjustment disorder with mixed anxiety and depressed mood: Secondary | ICD-10-CM | POA: Diagnosis not present

## 2022-01-25 MED ORDER — MELATONIN 5 MG PO TABS: 2.5000 mg | ORAL_TABLET | Freq: Every day | ORAL | 1 refills | Status: DC

## 2022-01-25 MED ORDER — HYDROXYZINE HCL 50 MG PO TABS: 50.0000 mg | ORAL_TABLET | Freq: Four times a day (QID) | ORAL | 1 refills | Status: DC | PRN

## 2022-01-25 MED ORDER — HYDROXYZINE HCL 50 MG PO TABS
50.0000 mg | ORAL_TABLET | Freq: Four times a day (QID) | ORAL | 0 refills | Status: AC | PRN
  Filled 2022-01-25: qty 20, 5d supply, fill #0

## 2022-01-25 MED ORDER — NAPROXEN 500 MG PO TABS
500.0000 mg | ORAL_TABLET | Freq: Two times a day (BID) | ORAL | 1 refills | Status: DC | PRN
Start: 2022-01-25 — End: 2022-01-25

## 2022-01-25 MED ORDER — QUETIAPINE FUMARATE 200 MG PO TABS
200.0000 mg | ORAL_TABLET | Freq: Every day | ORAL | 0 refills | Status: AC
  Filled 2022-01-25: qty 10, 10d supply, fill #0

## 2022-01-25 MED ORDER — QUETIAPINE FUMARATE 200 MG PO TABS: 200.0000 mg | ORAL_TABLET | Freq: Every day | ORAL | 1 refills | Status: DC

## 2022-01-25 MED ORDER — MELATONIN 5 MG PO TABS
2.5000 mg | ORAL_TABLET | Freq: Every day | ORAL | 0 refills | Status: AC
  Filled 2022-01-25: qty 10, 20d supply, fill #0

## 2022-01-25 MED ORDER — NAPROXEN 500 MG PO TABS
500.0000 mg | ORAL_TABLET | Freq: Two times a day (BID) | ORAL | 0 refills | Status: AC | PRN
Start: 2022-01-25 — End: ?
  Filled 2022-01-25: qty 20, 10d supply, fill #0

## 2022-01-25 NOTE — Progress Notes (Signed)
Patient denies SI,HI and AVH. Verbalized understanding discharge instructions,follow up care and prescriptions. 7 days medicines given to patient. All belongings returned from from [facility] locker. Patient escorted out by staff and transported by friend.

## 2022-01-25 NOTE — Progress Notes (Signed)
  [facility] Adult Case Management Discharge Plan :  Will you be returning to the same living situation after discharge:  Yes,  pt plans to return to boyfriend's home. At discharge, do you have transportation home?: Yes,  pt support network to provide transportation. Do you have the ability to pay for your medications: No.  Release of information consent forms completed and in the chart;  Patient's signature needed at discharge.  Patient to Follow up at:  Follow-up Information     [facility]. Go to.   Specialty: Behavioral Health Why: They have walk-in hours on Mondays and Wednesdays from 8am-11am for both psychiatric services and therapy. It is recommended that you arrive prior to 7:30am as clients are seen on a first come, first served basis. Thanks! Contact information: [address] [place] [address] [phone]                Next level of care provider has access to [facility] Link:no  Safety Planning and Suicide Prevention discussed: Yes,  SPE completed with pt due to not being able to reach friend prior to discharge.      Has patient been referred to the Quitline?: N/A patient is not a smoker  Patient has been referred for addiction treatment: Yes  Glenis Smoker, LCSW 01/25/2022, 1:26 PM

## 2022-01-25 NOTE — BH IP Treatment Plan (Signed)
Interdisciplinary Treatment and Diagnostic Plan Update  01/25/2022 Time of Session: 8:30AM Heidi Pearson MRN: [id number]  Principal Diagnosis: Adjustment disorder with mixed anxiety and depressed mood  Secondary Diagnoses: Principal Problem:   Adjustment disorder with mixed anxiety and depressed mood Active Problems:   Anxiety   Benzodiazepine withdrawal (HCC)   Opiate withdrawal (HCC)   Current Medications:  Current Facility-Administered Medications  Medication Dose Route Frequency Provider Last Rate Last Admin   acetaminophen (TYLENOL) tablet 650 mg  650 mg Oral Q6H PRN Clapacs, John T, MD   650 mg at 01/25/22 0826   alum & mag hydroxide-simeth (MAALOX/MYLANTA) 200-200-20 MG/5ML suspension 30 mL  30 mL Oral Q4H PRN Clapacs, John T, MD   30 mL at 01/24/22 1607   cloNIDine (CATAPRES) tablet 0.05 mg  0.05 mg Oral Q8H PRN Clapacs, John T, MD   0.05 mg at 01/25/22 6606   dicyclomine (BENTYL) capsule 20 mg  20 mg Oral Q6H PRN Clapacs, Jackquline Denmark, MD   20 mg at 01/24/22 0248   hydrOXYzine (ATARAX) tablet 50 mg  50 mg Oral Q6H PRN Clapacs, Jackquline Denmark, MD   50 mg at 01/25/22 0336   loperamide (IMODIUM) capsule 2 mg  2 mg Oral PRN Clapacs, John T, MD   2 mg at 01/21/22 1719   magnesium hydroxide (MILK OF MAGNESIA) suspension 30 mL  30 mL Oral Daily PRN Clapacs, John T, MD       melatonin tablet 2.5 mg  2.5 mg Oral QHS Clapacs, John T, MD   2.5 mg at 01/24/22 2109   methocarbamol (ROBAXIN) tablet 500 mg  500 mg Oral Q8H PRN Clapacs, John T, MD   500 mg at 01/24/22 2109   naproxen (NAPROSYN) tablet 500 mg  500 mg Oral BID PRN Clapacs, John T, MD   500 mg at 01/25/22 0336   OLANZapine zydis (ZYPREXA) disintegrating tablet 5 mg  5 mg Oral Q8H PRN Clapacs, John T, MD   5 mg at 01/25/22 0826   ondansetron (ZOFRAN-ODT) disintegrating tablet 4 mg  4 mg Oral Q8H PRN Danelle Earthly, Kashif   4 mg at 01/24/22 2109   QUEtiapine (SEROQUEL) tablet 200 mg  200 mg Oral QHS Clapacs, John T, MD   200 mg at 01/24/22 2109    PTA Medications: Medications Prior to Admission  Medication Sig Dispense Refill Last Dose   acetaminophen (TYLENOL) 500 MG tablet Take 500 mg by mouth every 6 (six) hours as needed for mild pain, moderate pain, fever or headache.      amphetamine-dextroamphetamine (ADDERALL) 20 MG tablet Take 20 mg by mouth 2 (two) times daily.      aspirin-acetaminophen-caffeine (EXCEDRIN MIGRAINE) 250-250-65 MG tablet Take 1 tablet by mouth every 6 (six) hours as needed for headache or migraine.      buprenorphine-naloxone (SUBOXONE) 8-2 mg SUBL SL tablet Place 1 tablet under the tongue 3 (three) times daily.      buPROPion (WELLBUTRIN XL) 150 MG 24 hr tablet Take 150 mg by mouth daily.      clonazePAM (KLONOPIN) 1 MG tablet Take 1 mg by mouth every 12 (twelve) hours as needed for anxiety.      diphenhydrAMINE (BENADRYL) 25 MG tablet Take 25 mg by mouth every 6 (six) hours as needed for allergies.      gabapentin (NEURONTIN) 100 MG capsule Take 100-200 mg by mouth at bedtime.      ibuprofen (ADVIL) 200 MG tablet Take 800 mg by mouth every 6 (six) hours  as needed for mild pain.      VRAYLAR 1.5 MG capsule Take 1.5 mg by mouth daily.       Patient Stressors: Substance abuse   Traumatic event    Patient Strengths: Ability for insight  Average or above average intelligence  Capable of independent living  Education administrator  Physical Health  Supportive family/friends  Work skills   Treatment Modalities: Medication Management, Group therapy, Case management,  1 to 1 session with clinician, Psychoeducation, Recreational therapy.   Physician Treatment Plan for Primary Diagnosis: Adjustment disorder with mixed anxiety and depressed mood Long Term Goal(s): Improvement in symptoms so as ready for discharge   Short Term Goals: Ability to maintain clinical measurements within normal limits will improve Compliance with prescribed medications will improve Ability to identify triggers  associated with substance abuse/mental health issues will improve Ability to verbalize feelings will improve Ability to disclose and discuss suicidal ideas Ability to demonstrate self-control will improve  Medication Management: Evaluate patient's response, side effects, and tolerance of medication regimen.  Therapeutic Interventions: 1 to 1 sessions, Unit Group sessions and Medication administration.  Evaluation of Outcomes: Adequate for Discharge  Physician Treatment Plan for Secondary Diagnosis: Principal Problem:   Adjustment disorder with mixed anxiety and depressed mood Active Problems:   Anxiety   Benzodiazepine withdrawal (HCC)   Opiate withdrawal (HCC)  Long Term Goal(s): Improvement in symptoms so as ready for discharge   Short Term Goals: Ability to maintain clinical measurements within normal limits will improve Compliance with prescribed medications will improve Ability to identify triggers associated with substance abuse/mental health issues will improve Ability to verbalize feelings will improve Ability to disclose and discuss suicidal ideas Ability to demonstrate self-control will improve     Medication Management: Evaluate patient's response, side effects, and tolerance of medication regimen.  Therapeutic Interventions: 1 to 1 sessions, Unit Group sessions and Medication administration.  Evaluation of Outcomes: Adequate for Discharge   RN Treatment Plan for Primary Diagnosis: Adjustment disorder with mixed anxiety and depressed mood Long Term Goal(s): Knowledge of disease and therapeutic regimen to maintain health will improve  Short Term Goals: Ability to remain free from injury will improve, Ability to verbalize frustration and anger appropriately will improve, Ability to demonstrate self-control, Ability to participate in decision making will improve, Ability to verbalize feelings will improve, Ability to disclose and discuss suicidal ideas, Ability to  identify and develop effective coping behaviors will improve, and Compliance with prescribed medications will improve  Medication Management: RN will administer medications as ordered by provider, will assess and evaluate patient's response and provide education to patient for prescribed medication. RN will report any adverse and/or side effects to prescribing provider.  Therapeutic Interventions: 1 on 1 counseling sessions, Psychoeducation, Medication administration, Evaluate responses to treatment, Monitor vital signs and CBGs as ordered, Perform/monitor CIWA, COWS, AIMS and Fall Risk screenings as ordered, Perform wound care treatments as ordered.  Evaluation of Outcomes: Adequate for Discharge   LCSW Treatment Plan for Primary Diagnosis: Adjustment disorder with mixed anxiety and depressed mood Long Term Goal(s): Safe transition to appropriate next level of care at discharge, Engage patient in therapeutic group addressing interpersonal concerns.  Short Term Goals: Engage patient in aftercare planning with referrals and resources, Increase social support, Increase ability to appropriately verbalize feelings, Increase emotional regulation, Facilitate acceptance of mental health diagnosis and concerns, Facilitate patient progression through stages of change regarding substance use diagnoses and concerns, Identify triggers associated with mental health/substance abuse  issues, and Increase skills for wellness and recovery  Therapeutic Interventions: Assess for all discharge needs, 1 to 1 time with Social worker, Explore available resources and support systems, Assess for adequacy in community support network, Educate family and significant other(s) on suicide prevention, Complete Psychosocial Assessment, Interpersonal group therapy.  Evaluation of Outcomes: Adequate for Discharge   Progress in Treatment: Attending groups: Yes. Participating in groups: Yes. Taking medication as prescribed:  Yes. Toleration medication: Yes. Family/Significant other contact made: No, will contact:  best friend, Harrel Carina. Patient understands diagnosis: Yes. Discussing patient identified problems/goals with staff: Yes. Medical problems stabilized or resolved: Yes. Denies suicidal/homicidal ideation: Yes. Issues/concerns per patient self-inventory: No. Other: none.  New problem(s) identified: No, Describe:  none identified. 01/25/22 Update: No changes at this time.   New Short Term/Long Term Goal(s): detox, medication management for mood stabilization; elimination of SI thoughts; development of comprehensive mental wellness/sobriety plan. 01/25/22 Update: No changes at this time.   Patient Goals:  "Just to feel better, have my mind clearer." 01/25/22 Update: No changes at this time.   Discharge Plan or Barriers: CSW will assist with development of an appropriate aftercare/discharge plan. 01/25/22 Update: Pt set to discharge today back to a private residence with outpatient follow up. She is working on contacting a friend for transportation.      Reason for Continuation of Hospitalization: Depression Medication stabilization Suicidal ideation Withdrawal symptoms   Estimated Length of Stay: 1-7 days 01/25/22 Update: No changes at this time.  Last 3 [place] Suicide Severity Risk Score: Flowsheet Row Admission (Current) from 01/18/2022 in [facility] INPATIENT BEHAVIORAL MEDICINE Most recent reading at 01/18/2022  2:33 PM ED from 01/16/2022 in [facility] Most recent reading at 01/16/2022  8:37 AM ED from 01/16/2022 in [facility] EMERGENCY DEPARTMENT Most recent reading at 01/16/2022 12:57 AM  C-SSRS RISK CATEGORY No Risk No Risk Error: Q3, 4, or 5 should not be populated when Q2 is No       Last PHQ 2/9 Scores:    01/16/2022   10:58 AM 01/15/2022   11:45 AM 01/13/2022   12:49 PM  Depression screen PHQ 2/9  Decreased Interest 1 2 2   Down, Depressed, Hopeless 2 1  2   PHQ - 2 Score 3 3 4   Altered sleeping 3 3 2   Tired, decreased energy 3 3 2   Change in appetite 3 3 2   Feeling bad or failure about yourself  2 3 2   Trouble concentrating 2 3 2   Moving slowly or fidgety/restless 0 2 2  Suicidal thoughts 1 1 2   PHQ-9 Score 17 21 18   Difficult doing work/chores Somewhat difficult Very difficult Very difficult    Scribe for Treatment Team: , LCSW 01/25/2022 9:29 AM

## 2022-01-25 NOTE — Progress Notes (Signed)
Recreation Therapy Notes  INPATIENT RECREATION TR PLAN  Patient Details Name: Heidi Pearson MRN: [id number] DOB: 12/21/1975 Today's Date: 01/25/2022  Rec Therapy Plan Is patient appropriate for Therapeutic Recreation?: Yes Treatment times per week: at least 3 Estimated Length of Stay: 5-7 days TR Treatment/Interventions: Group participation (Comment)  Discharge Criteria Pt will be discharged from therapy if:: Discharged Treatment plan/goals/alternatives discussed and agreed upon by:: Patient/family  Discharge Summary Short term goals set: Patient will identify benefit of making healthy decisions post d/c within 5 recreation therapy group sessions Short term goals met: Adequate for discharge Progress toward goals comments: Groups attended Which groups?: Wellness, Leisure education, Other (Comment) (Problem-Solving, Self-care) Reason goals not met: N/A Therapeutic equipment acquired: N/A Reason patient discharged from therapy: Discharge from hospital Pt/family agrees with progress & goals achieved: Yes Date patient discharged from therapy: 01/25/22   Yitzchak Kothari 01/25/2022, 12:26 PM

## 2022-01-25 NOTE — [facility] Suicide Risk Assessment (Signed)
[facility] INPATIENT:  Family/Significant Other Suicide Prevention Education  Suicide Prevention Education:  Contact Attempts: Marchelle Folks Tolbert/bestfriend [phone]), has been identified by the patient as the family member/significant other with whom the patient will be residing, and identified as the person(s) who will aid the patient in the event of a mental health crisis.  With written consent from the patient, two attempts were made to provide suicide prevention education, prior to and/or following the patient's discharge.  We were unsuccessful in providing suicide prevention education.  A suicide education pamphlet was given to the patient to share with family/significant other.  Date and time of first attempt: 01/25/22 at 09:58 Date and time of second attempt: Second attempt needed  Glenis Smoker 01/25/2022, 9:59 AM

## 2022-01-25 NOTE — [facility] Suicide Risk Assessment (Signed)
[facility] Discharge Suicide Risk Assessment   Principal Problem: Adjustment disorder with mixed anxiety and depressed mood Discharge Diagnoses: Principal Problem:   Adjustment disorder with mixed anxiety and depressed mood Active Problems:   Anxiety   Benzodiazepine withdrawal (HCC)   Opiate withdrawal (HCC)   Total Time spent with patient: 30 minutes  Musculoskeletal: Strength & Muscle Tone: within normal limits Gait & Station: normal Patient leans: N/A  Psychiatric Specialty Exam  Presentation  General Appearance: Casual; Appropriate for Environment  Eye Contact:Good  Speech:Clear and Coherent; Normal Rate  Speech Volume:Normal  Handedness:Right   Mood and Affect  Mood:Anxious; Depressed; Hopeless  Duration of Depression Symptoms: Greater than two weeks  Affect:Tearful; Depressed; Congruent   Thought Process  Thought Processes:Coherent  Descriptions of Associations:Circumstantial  Orientation:Full (Time, Place and Person)  Thought Content:Logical  History of Schizophrenia/Schizoaffective disorder:No  Duration of Psychotic Symptoms:No data recorded Hallucinations:No data recorded Ideas of Reference:None  Suicidal Thoughts:No data recorded Homicidal Thoughts:No data recorded  Sensorium  Memory:Immediate Good; Recent Good; Remote Good  Judgment:Good  Insight:Good   Executive Functions  Concentration:Good  Attention Span:Good  Recall:Good  Fund of Knowledge:Good  Language:Good   Psychomotor Activity  Psychomotor Activity:No data recorded  Assets  Assets:Communication Skills; Desire for Improvement; Financial Resources/Insurance; Housing; Resilience; Physical Health; Social Support   Sleep  Sleep:No data recorded  Physical Exam: Physical Exam Vitals and nursing note reviewed.  Constitutional:      Appearance: Normal appearance.  HENT:     Head: Normocephalic and atraumatic.     Mouth/Throat:     Pharynx: Oropharynx is clear.   Eyes:     Pupils: Pupils are equal, round, and reactive to light.  Cardiovascular:     Rate and Rhythm: Normal rate and regular rhythm.  Pulmonary:     Effort: Pulmonary effort is normal.     Breath sounds: Normal breath sounds.  Abdominal:     General: Abdomen is flat.     Palpations: Abdomen is soft.  Musculoskeletal:        General: Normal range of motion.  Skin:    General: Skin is warm and dry.  Neurological:     General: No focal deficit present.     Mental Status: She is alert. Mental status is at baseline.  Psychiatric:        Mood and Affect: Mood normal.        Thought Content: Thought content normal.    Review of Systems  Constitutional: Negative.   HENT: Negative.    Eyes: Negative.   Respiratory: Negative.    Cardiovascular: Negative.   Gastrointestinal: Negative.   Musculoskeletal: Negative.   Skin: Negative.   Neurological: Negative.   Psychiatric/Behavioral:  Negative for depression, hallucinations and suicidal ideas. The patient is nervous/anxious.    Blood pressure 130/70, pulse 85, temperature 98 F (36.7 C), temperature source Oral, resp. rate 20, height 5\' 3"  (1.6 m), weight 96.2 kg, last menstrual period 01/07/2022, SpO2 97 %. Body mass index is 37.55 kg/m.  Mental Status Per Nursing Assessment::   On Admission:  NA  Demographic Factors:  NA  Loss Factors: Financial problems/change in socioeconomic status  Historical Factors: Impulsivity  Risk Reduction Factors:   Positive therapeutic relationship and Positive coping skills or problem solving skills  Continued Clinical Symptoms:  Severe Anxiety and/or Agitation Depression:   Comorbid alcohol abuse/dependence  Cognitive Features That Contribute To Risk:  None    Suicide Risk:  Minimal: No identifiable suicidal ideation.  Patients presenting with no  risk factors but with morbid ruminations; may be classified as minimal risk based on the severity of the depressive symptoms    Plan  Of Care/Follow-up recommendations:  Other:  Follow-up with outpatient treatment as recommended in local community while continuing current medication.  Mordecai Rasmussen, MD 01/25/2022, 10:52 AM

## 2022-01-25 NOTE — Progress Notes (Signed)
Recreation Therapy Notes    Date: 01/25/2022   Time: 10:30 am   Location: Craft room    Behavioral response: N/A   Intervention Topic: Stress Management    Discussion/Intervention: Patient refused to attend group.   Clinical Observations/Feedback:  Patient refused to attend group.   Jerrod Damiano LRT/CTRS          Ty Oshima 01/25/2022 11:22 AM

## 2022-01-25 NOTE — Discharge Summary (Signed)
Physician Discharge Summary Note  Patient:  Heidi Pearson is an 46 y.o., female MRN:  [id number] DOB:  1976/05/15 Patient phone:  [phone] (home)  Patient address:   [address] [place] [address],  Total Time spent with patient: 30 minutes  Date of Admission:  01/18/2022 Date of Discharge: 01/25/2022  Reason for Admission: Patient was admitted in transfer from [facility] because of ongoing depression and anxiety with passive suicidal ideation and discomfort with discharge.  Patient has been going through a lot of physical symptoms withdrawing from medication with anxiety and mood swings  Principal Problem: Adjustment disorder with mixed anxiety and depressed mood Discharge Diagnoses: Principal Problem:   Adjustment disorder with mixed anxiety and depressed mood Active Problems:   Anxiety   Benzodiazepine withdrawal (HCC)   Opiate withdrawal (HCC)   Past Psychiatric History: Past history of anxiety depression and substance use disorder  Past Medical History:  Past Medical History:  Diagnosis Date   ADD (attention deficit disorder)    Anxiety    Anxiety    Chronic knee pain    Depression    PTSD (post-traumatic stress disorder)     Past Surgical History:  Procedure Laterality Date   CESAREAN SECTION     Family History:  Family History  Problem Relation Age of Onset   Hypertension Mother    Cancer Mother    Hypertension Father    Family Psychiatric  History: See above Social History:  Social History   Substance and Sexual Activity  Alcohol Use Yes   Comment: socially      Social History   Substance and Sexual Activity  Drug Use No   Types: Oxycodone, Hydrocodone   Comment: Quit 04/11/2012 herion two years ago    Social History   Socioeconomic History   Marital status: Divorced    Spouse name: Not on file   Number of children: Not on file   Years of education: Not on file   Highest education level: Not on file  Occupational  History   Not on file  Tobacco Use   Smoking status: Never   Smokeless tobacco: Never  Vaping Use   Vaping Use: Never used  Substance and Sexual Activity   Alcohol use: Yes    Comment: socially    Drug use: No    Types: Oxycodone, Hydrocodone    Comment: Quit 04/11/2012 herion two years ago   Sexual activity: Not Currently    Birth control/protection: None  Other Topics Concern   Not on file  Social History Narrative   Not on file   Social Determinants of Health   Financial Resource Strain: Not on file  Food Insecurity: No Food Insecurity (01/18/2022)   Hunger Vital Sign    Worried About Running Out of Food in the Last Year: Never true    Ran Out of Food in the Last Year: Never true  Transportation Needs: No Transportation Needs (01/25/2022)   PRAPARE - Administrator, Civil Service (Medical): No    Lack of Transportation (Non-Medical): No  Physical Activity: Not on file  Stress: Not on file  Social Connections: Not on file    Hospital Course: Patient was admitted to psychiatric unit.  15-minute checks maintained.  Patient complained of multiple physical symptoms that came and went during her time here most of which she attributed to ongoing withdrawal from opiates.  It is certainly possible that she was still having lingering symptoms of opiate withdrawal.  These  were treated with medications such as muscle relaxers antinausea medicines antidiarrheal medicines and clonidine as the patient did not want to be back on Suboxone.  Patient gradually showed improvement in symptoms with anxiety and depression improving.  She is now on modest dose of Seroquel at night for anxiety and depression.  She is not reporting any suicidal ideation and she is calm and lucid and agreeable to outpatient referral for treatment after discharge.  Prescriptions and supply provided  Physical Findings: AIMS: Facial and Oral Movements Muscles of Facial Expression: None, normal Lips and Perioral  Area: None, normal Jaw: None, normal Tongue: None, normal,Extremity Movements Upper (arms, wrists, hands, fingers): None, normal Lower (legs, knees, ankles, toes): None, normal, Trunk Movements Neck, shoulders, hips: None, normal, Overall Severity Severity of abnormal movements (highest score from questions above): None, normal Incapacitation due to abnormal movements: None, normal Patient's awareness of abnormal movements (rate only patient's report): No Awareness, Dental Status Current problems with teeth and/or dentures?: No Does patient usually wear dentures?: No  CIWA:    COWS:     Musculoskeletal: Strength & Muscle Tone: within normal limits Gait & Station: normal Patient leans: N/A   Psychiatric Specialty Exam:  Presentation  General Appearance: Casual; Appropriate for Environment  Eye Contact:Good  Speech:Clear and Coherent; Normal Rate  Speech Volume:Normal  Handedness:Right   Mood and Affect  Mood:Anxious; Depressed; Hopeless  Affect:Tearful; Depressed; Congruent   Thought Process  Thought Processes:Coherent  Descriptions of Associations:Circumstantial  Orientation:Full (Time, Place and Person)  Thought Content:Logical  History of Schizophrenia/Schizoaffective disorder:No  Duration of Psychotic Symptoms:No data recorded Hallucinations:No data recorded Ideas of Reference:None  Suicidal Thoughts:No data recorded Homicidal Thoughts:No data recorded  Sensorium  Memory:Immediate Good; Recent Good; Remote Good  Judgment:Good  Insight:Good   Executive Functions  Concentration:Good  Attention Span:Good  Recall:Good  Fund of Knowledge:Good  Language:Good   Psychomotor Activity  Psychomotor Activity:No data recorded  Assets  Assets:Communication Skills; Desire for Improvement; Financial Resources/Insurance; Housing; Resilience; Physical Health; Social Support   Sleep  Sleep:No data recorded   Physical Exam: Physical  Exam Vitals and nursing note reviewed.  Constitutional:      Appearance: Normal appearance.  HENT:     Head: Normocephalic and atraumatic.     Mouth/Throat:     Pharynx: Oropharynx is clear.  Eyes:     Pupils: Pupils are equal, round, and reactive to light.  Cardiovascular:     Rate and Rhythm: Normal rate and regular rhythm.  Pulmonary:     Effort: Pulmonary effort is normal.     Breath sounds: Normal breath sounds.  Abdominal:     General: Abdomen is flat.     Palpations: Abdomen is soft.  Musculoskeletal:        General: Normal range of motion.  Skin:    General: Skin is warm and dry.  Neurological:     General: No focal deficit present.     Mental Status: She is alert. Mental status is at baseline.  Psychiatric:        Attention and Perception: Attention normal.        Mood and Affect: Mood normal.        Speech: Speech normal.        Behavior: Behavior normal.        Thought Content: Thought content normal.        Cognition and Memory: Cognition normal.    Review of Systems  Constitutional: Negative.   HENT: Negative.    Eyes: Negative.  Respiratory: Negative.    Cardiovascular: Negative.   Gastrointestinal: Negative.   Musculoskeletal: Negative.   Skin: Negative.   Neurological: Negative.   Psychiatric/Behavioral: Negative.     Blood pressure 130/70, pulse 85, temperature 98 F (36.7 C), temperature source Oral, resp. rate 20, height 5\' 3"  (1.6 m), weight 96.2 kg, last menstrual period 01/07/2022, SpO2 97 %. Body mass index is 37.55 kg/m.   Social History   Tobacco Use  Smoking Status Never  Smokeless Tobacco Never   Tobacco Cessation:  N/A, patient does not currently use tobacco products   Blood Alcohol level:  Lab Results  Component Value Date   ETH <10 01/13/2022   ETH 96 (H) 12/11/2013    Metabolic Disorder Labs:  Lab Results  Component Value Date   HGBA1C 5.4 01/19/2022   MPG 108.28 01/19/2022   MPG 108.28 01/13/2022   Lab Results   Component Value Date   PROLACTIN 22.8 01/13/2022   Lab Results  Component Value Date   CHOL 229 (H) 01/19/2022   TRIG 154 (H) 01/19/2022   HDL 43 01/19/2022   CHOLHDL 5.3 01/19/2022   VLDL 31 01/19/2022   LDLCALC 155 (H) 01/19/2022   LDLCALC 129 (H) 01/13/2022    See Psychiatric Specialty Exam and Suicide Risk Assessment completed by Attending Physician prior to discharge.  Discharge destination:  Home  Is patient on multiple antipsychotic therapies at discharge:  No   Has Patient had three or more failed trials of antipsychotic monotherapy by history:  No  Recommended Plan for Multiple Antipsychotic Therapies: NA  Discharge Instructions     Diet - low sodium heart healthy   Complete by: As directed    Increase activity slowly   Complete by: As directed       Allergies as of 01/25/2022       Reactions   Morphine And Related Hives   Paxil [paroxetine] Other (See Comments)   "I became manic"        Medication List     STOP taking these medications    acetaminophen 500 MG tablet Commonly known as: TYLENOL   amphetamine-dextroamphetamine 20 MG tablet Commonly known as: ADDERALL   aspirin-acetaminophen-caffeine 250-250-65 MG tablet Commonly known as: EXCEDRIN MIGRAINE   buprenorphine-naloxone 8-2 mg Subl SL tablet Commonly known as: SUBOXONE   buPROPion 150 MG 24 hr tablet Commonly known as: WELLBUTRIN XL   clonazePAM 1 MG tablet Commonly known as: KLONOPIN   diphenhydrAMINE 25 MG tablet Commonly known as: BENADRYL   gabapentin 100 MG capsule Commonly known as: NEURONTIN   ibuprofen 200 MG tablet Commonly known as: ADVIL   Vraylar 1.5 MG capsule Generic drug: cariprazine       TAKE these medications      Indication  hydrOXYzine 50 MG tablet Commonly known as: ATARAX Take 1 tablet (50 mg total) by mouth every 6 (six) hours as needed for anxiety.  Indication: Feeling Anxious   melatonin 5 MG Tabs Take 0.5 tablets (2.5 mg total) by  mouth at bedtime.  Indication: Trouble Sleeping   naproxen 500 MG tablet Commonly known as: NAPROSYN Take 1 tablet (500 mg total) by mouth 2 (two) times daily as needed for moderate pain, headache or mild pain.  Indication: Pain   QUEtiapine 200 MG tablet Commonly known as: SEROQUEL Take 1 tablet (200 mg total) by mouth at bedtime.  Indication: Depressive Phase of Manic-Depression        Follow-up Information     [facility]  Go to.   Specialty: Behavioral Health Why: They have walk-in hours on Mondays and Wednesdays from 8am-11am for both psychiatric services and therapy. It is recommended that you arrive prior to 7:30am as clients are seen on a first come, first served basis. Thanks! Contact information: [address] [place] [address] [phone]                Follow-up recommendations:  Other:  Follow-up with [facility].  Follow up with any support or 12-step groups possible or needed to maintain sobriety.  Continue current medicine.  Patient denies suicidal thoughts and appears stable and ready for discharge  Comments: Prescriptions and supply provided  Signed: Mordecai Rasmussen, MD 01/25/2022, 11:37 AM

## 2022-03-07 ENCOUNTER — Emergency Department (HOSPITAL_BASED_OUTPATIENT_CLINIC_OR_DEPARTMENT_OTHER)
Admission: EM | Admit: 2022-03-07 | Discharge: 2022-03-07 | Disposition: A | Discharge status: Home / Self Care | Payer: Self-pay | Attending: Emergency Medicine | Admitting: Emergency Medicine

## 2022-03-07 ENCOUNTER — Emergency Department (HOSPITAL_BASED_OUTPATIENT_CLINIC_OR_DEPARTMENT_OTHER): Payer: Self-pay

## 2022-03-07 ENCOUNTER — Encounter (HOSPITAL_BASED_OUTPATIENT_CLINIC_OR_DEPARTMENT_OTHER): Payer: Self-pay | Admitting: Pediatrics

## 2022-03-07 ENCOUNTER — Other Ambulatory Visit: Payer: Self-pay

## 2022-03-07 DIAGNOSIS — R7989 Other specified abnormal findings of blood chemistry: Secondary | ICD-10-CM | POA: Insufficient documentation

## 2022-03-07 DIAGNOSIS — M545 Low back pain, unspecified: Secondary | ICD-10-CM | POA: Insufficient documentation

## 2022-03-07 DIAGNOSIS — R630 Anorexia: Secondary | ICD-10-CM | POA: Insufficient documentation

## 2022-03-07 DIAGNOSIS — R1013 Epigastric pain: Secondary | ICD-10-CM | POA: Insufficient documentation

## 2022-03-07 DIAGNOSIS — R112 Nausea with vomiting, unspecified: Secondary | ICD-10-CM | POA: Insufficient documentation

## 2022-03-07 LAB — URINALYSIS, MICROSCOPIC (REFLEX)

## 2022-03-07 LAB — CBC
HCT: 43.9 % (ref 36.0–46.0)
Hemoglobin: 14.1 g/dL (ref 12.0–15.0)
MCH: 28.3 pg (ref 26.0–34.0)
MCHC: 32.1 g/dL (ref 30.0–36.0)
MCV: 88.2 fL (ref 80.0–100.0)
Platelets: 459 10*3/uL — ABNORMAL HIGH (ref 150–400)
RBC: 4.98 MIL/uL (ref 3.87–5.11)
RDW: 12 % (ref 11.5–15.5)
WBC: 8.2 10*3/uL (ref 4.0–10.5)
nRBC: 0 % (ref 0.0–0.2)

## 2022-03-07 LAB — COMPREHENSIVE METABOLIC PANEL
ALT: 25 U/L (ref 0–44)
AST: 27 U/L (ref 15–41)
Albumin: 4.1 g/dL (ref 3.5–5.0)
Alkaline Phosphatase: 88 U/L (ref 38–126)
Anion gap: 7 (ref 5–15)
BUN: 10 mg/dL (ref 6–20)
CO2: 26 mmol/L (ref 22–32)
Calcium: 10.6 mg/dL — ABNORMAL HIGH (ref 8.9–10.3)
Chloride: 106 mmol/L (ref 98–111)
Creatinine, Ser: 1.03 mg/dL — ABNORMAL HIGH (ref 0.44–1.00)
GFR, Estimated: >60 mL/min (ref >60)
Glucose, Bld: 134 mg/dL — ABNORMAL HIGH (ref 70–99)
Potassium: 4.3 mmol/L (ref 3.5–5.1)
Sodium: 139 mmol/L (ref 135–145)
Total Bilirubin: 0.4 mg/dL (ref 0.3–1.2)
Total Protein: 7.9 g/dL (ref 6.5–8.1)

## 2022-03-07 LAB — URINALYSIS, ROUTINE W REFLEX MICROSCOPIC
Glucose, UA: NEGATIVE mg/dL
Ketones, ur: NEGATIVE mg/dL
Nitrite: NEGATIVE
Protein, ur: 100 mg/dL — AB
Specific Gravity, Urine: >=1.03 (ref 1.005–1.030)
pH: 5 (ref 5.0–8.0)

## 2022-03-07 LAB — LIPASE, BLOOD: Lipase: 20 U/L (ref 11–51)

## 2022-03-07 LAB — PREGNANCY, URINE: Preg Test, Ur: NEGATIVE

## 2022-03-07 MED ORDER — FAMOTIDINE 20 MG PO TABS: 20.0000 mg | ORAL_TABLET | Freq: Two times a day (BID) | ORAL | 0 refills | Status: AC

## 2022-03-07 MED ORDER — ONDANSETRON HCL 4 MG/2ML IJ SOLN
4.0000 mg | Freq: Once | INTRAMUSCULAR | Status: AC
  Administered 2022-03-07: 4 mg via INTRAVENOUS
  Filled 2022-03-07: qty 2

## 2022-03-07 MED ORDER — KETOROLAC TROMETHAMINE 15 MG/ML IJ SOLN
15.0000 mg | Freq: Once | INTRAMUSCULAR | Status: AC
  Administered 2022-03-07: 15 mg via INTRAVENOUS
  Filled 2022-03-07: qty 1

## 2022-03-07 MED ORDER — DICYCLOMINE HCL 20 MG PO TABS: 20.0000 mg | ORAL_TABLET | Freq: Two times a day (BID) | ORAL | 0 refills | Status: AC

## 2022-03-07 MED ORDER — SODIUM CHLORIDE 0.9 % IV BOLUS
1000.0000 mL | Freq: Once | INTRAVENOUS | Status: AC
  Administered 2022-03-07: 1000 mL via INTRAVENOUS

## 2022-03-07 NOTE — ED Provider Notes (Signed)
[facility] EMERGENCY DEPARTMENT Provider Note   CSN: [id number] Arrival date & time: 03/07/22  1318     History  Chief Complaint  Patient presents with   Abdominal Pain   Back Pain    Heidi Pearson is a 46 y.o. female.  Patient with history of cesarean section presents to the emergency department today for evaluation of 3 days of lower back pain with nausea and vomiting, decreased appetite, and epigastric discomfort.  No associated fevers, chest pain or shortness of breath.  No diarrhea or constipation.  No dysuria, increased frequency or urgency.  She states that she has taken over-the-counter ibuprofen without improvement.  She denies history of other abdominal surgeries.  Of note, 1 to 2 months ago, was admitted for polysubstance use including opiate detox, SI.      Home Medications Prior to Admission medications   Medication Sig Start Date End Date Taking? Authorizing Provider  hydrOXYzine (ATARAX) 50 MG tablet Take 1 tablet (50 mg total) by mouth every 6 (six) hours as needed for anxiety. 01/25/22   Clapacs, Jackquline Denmark, MD  melatonin 5 MG TABS Take 0.5 tablets (2.5 mg total) by mouth at bedtime. 01/25/22   Clapacs, Jackquline Denmark, MD  naproxen (NAPROSYN) 500 MG tablet Take 1 tablet (500 mg total) by mouth 2 (two) times daily as needed for moderate pain, headache or mild pain. 01/25/22   Clapacs, Jackquline Denmark, MD  QUEtiapine (SEROQUEL) 200 MG tablet Take 1 tablet (200 mg total) by mouth at bedtime. 01/25/22   Clapacs, Jackquline Denmark, MD      Allergies    Morphine and related and Paxil [paroxetine]    Review of Systems   Review of Systems  Physical Exam Updated Vital Signs BP (!) 100/59 (BP Location: Left Arm)   Pulse 74   Temp 97.7 F (36.5 C) (Oral)   Resp 18   Ht 5\' 3"  (1.6 m)   Wt 86.2 kg   SpO2 94%   BMI 33.66 kg/m   Physical Exam Vitals and nursing note reviewed.  Constitutional:      General: She is not in acute distress.    Appearance: She is well-developed.   HENT:     Head: Normocephalic and atraumatic.     Right Ear: External ear normal.     Left Ear: External ear normal.     Nose: Nose normal.  Eyes:     Conjunctiva/sclera: Conjunctivae normal.  Cardiovascular:     Rate and Rhythm: Normal rate and regular rhythm.     Heart sounds: No murmur heard. Pulmonary:     Effort: No respiratory distress.     Breath sounds: No wheezing, rhonchi or rales.  Abdominal:     Palpations: Abdomen is soft.     Tenderness: There is abdominal tenderness in the epigastric area and left upper quadrant. There is no guarding or rebound. Negative signs include Murphy's sign and McBurney's sign.  Musculoskeletal:     Cervical back: Normal range of motion and neck supple.     Right lower leg: No edema.     Left lower leg: No edema.  Skin:    General: Skin is warm and dry.     Findings: No rash.  Neurological:     General: No focal deficit present.     Mental Status: She is alert. Mental status is at baseline.     Motor: No weakness.  Psychiatric:        Mood and Affect: Mood normal.  ED Results / Procedures / Treatments   Labs (all labs ordered are listed, but only abnormal results are displayed) Labs Reviewed  COMPREHENSIVE METABOLIC PANEL - Abnormal; Notable for the following components:      Result Value   Glucose, Bld 134 (*)    Creatinine, Ser 1.03 (*)    Calcium 10.6 (*)    All other components within normal limits  CBC - Abnormal; Notable for the following components:   Platelets 459 (*)    All other components within normal limits  URINALYSIS, ROUTINE W REFLEX MICROSCOPIC - Abnormal; Notable for the following components:   APPearance CLOUDY (*)    Hgb urine dipstick MODERATE (*)    Bilirubin Urine SMALL (*)    Protein, ur 100 (*)    Leukocytes,Ua SMALL (*)    All other components within normal limits  URINALYSIS, MICROSCOPIC (REFLEX) - Abnormal; Notable for the following components:   Bacteria, UA FEW (*)    All other components  within normal limits  LIPASE, BLOOD  PREGNANCY, URINE    EKG EKG Interpretation  Date/Time:  Tuesday March 07 2022 14:00:44 EDT Ventricular Rate:  67 PR Interval:  170 QRS Duration: 84 QT Interval:  386 QTC Calculation: 407 R Axis:   123 Text Interpretation: Normal sinus rhythm Right axis deviation Septal infarct , age undetermined Abnormal ECG When compared with ECG of 16-Jan-2022 03:34, PREVIOUS ECG IS PRESENT No significant change since last tracing Confirmed by Isla Pence ([id number]) on 03/07/2022 2:56:08 PM  Radiology CT Renal Stone Study  Result Date: 03/07/2022 CLINICAL DATA:  Back pain with nausea vomiting. Clinical suspicion for kidney stone. EXAM: CT ABDOMEN AND PELVIS WITHOUT CONTRAST TECHNIQUE: Multidetector CT imaging of the abdomen and pelvis was performed following the standard protocol without IV contrast. RADIATION DOSE REDUCTION: This exam was performed according to the departmental dose-optimization program which includes automated exposure control, adjustment of the mA and/or kV according to patient size and/or use of iterative reconstruction technique. COMPARISON:  None Available. FINDINGS: Lower chest: Clear lung bases. Hepatobiliary: Liver normal in size. Diffuse decreased liver attenuation consistent with fatty infiltration. No liver mass. Normal gallbladder. No bile duct dilation. Pancreas: Unremarkable. No pancreatic ductal dilatation or surrounding inflammatory changes. Spleen: Normal in size without focal abnormality. Adrenals/Urinary Tract: Adrenal glands are unremarkable. Kidneys are normal, without renal calculi, focal lesion, or hydronephrosis. Bladder is unremarkable. Stomach/Bowel: Stomach is within normal limits. Appendix appears normal. No evidence of bowel wall thickening, distention, or inflammatory changes. Vascular/Lymphatic: No significant vascular findings are present. No enlarged abdominal or pelvic lymph nodes. Reproductive: Uterus and bilateral  adnexa are unremarkable. Other: No abdominal wall hernia or abnormality. No abdominopelvic ascites. Musculoskeletal: No acute or significant osseous findings. IMPRESSION: 1. No acute findings. No renal or ureteral stones. No findings to account back/flank pain, nausea or vomiting. 2. Hepatic steatosis.  No other abnormalities. Electronically Signed   By: Lajean Manes M.D.   On: 03/07/2022 16:56    Procedures Procedures    Medications Ordered in ED Medications  sodium chloride 0.9 % bolus 1,000 mL (has no administration in time range)  ketorolac (TORADOL) 15 MG/ML injection 15 mg (has no administration in time range)  ondansetron (ZOFRAN) injection 4 mg (has no administration in time range)    ED Course/ Medical Decision Making/ A&P    Patient seen and examined. History obtained directly from patient. Work-up including labs, imaging, EKG ordered in triage, if performed, were reviewed.    Labs/EKG: Independently reviewed and interpreted.  This included: CBC unremarkable; CMP minimal elevation in creatinine at 1.03, normal electrolytes, glucose 134; lipase normal; UA does show some blood in the urine with calcium oxalate crystals, only 6-10 white blood cells, negative nitrite.  Imaging: Ordered CT renal protocol  Medications/Fluids: Ordered: IV fluid bolus, IV Toradol/Zofran  Most recent vital signs reviewed and are as follows: BP (!) 100/59 (BP Location: Left Arm)   Pulse 74   Temp 97.7 F (36.5 C) (Oral)   Resp 18   Ht 5\' 3"  (1.6 m)   Wt 86.2 kg   SpO2 94%   BMI 33.66 kg/m   Initial impression: Nausea and vomiting, lower back pain, blood noted in urine with calcium oxalate crystals, will rule out renal colic.  She would also benefit from treatment with IV fluids given minimally elevated creatinine.  5:32 PM Reassessment performed. Patient appears comfortable, exam unchanged.  Imaging personally visualized and interpreted including: CT renal protocol, agree no acute findings  including no signs of ureteral stones  Reviewed pertinent lab work and imaging with patient at bedside. Questions answered.   Most current vital signs reviewed and are as follows: BP 111/72   Pulse 70   Temp 97.7 F (36.5 C) (Oral)   Resp 18   Ht 5\' 3"  (1.6 m)   Wt 86.2 kg   SpO2 100%   BMI 33.66 kg/m   Plan: Discharge to home.   Prescriptions written for: Pepcid, Bentyl empirically  Other home care instructions discussed: diet, good fluid intake, use of medications for symptom control  ED return instructions discussed: The patient was urged to return to the Emergency Department immediately with worsening of current symptoms, worsening abdominal pain, persistent vomiting, blood noted in stools, fever, or any other concerns. The patient verbalized understanding.   Follow-up instructions discussed: Patient encouraged to follow-up with their PCP in 3 days.                             Medical Decision Making Amount and/or Complexity of Data Reviewed Labs: ordered. Radiology: ordered.  Risk Prescription drug management.   For this patient's complaint of abdominal pain, the following conditions were considered on the differential diagnosis: gastritis/PUD, enteritis/duodenitis, appendicitis, cholelithiasis/cholecystitis, cholangitis, pancreatitis, ruptured viscus, colitis, diverticulitis, small/large bowel obstruction, proctitis, cystitis, pyelonephritis, ureteral colic, aortic dissection, aortic aneurysm. In women, ectopic pregnancy, pelvic inflammatory disease, ovarian cysts, and tubo-ovarian abscess were also considered. Atypical chest etiologies were also considered including ACS, PE, and pneumonia.   The patient's vital signs, pertinent lab work and imaging were reviewed and interpreted as discussed in the ED course. Hospitalization was considered for further testing, treatments, or serial exams/observation. However as patient is well-appearing, has a stable exam, and  reassuring studies today, I do not feel that they warrant admission at this time. This plan was discussed with the patient who verbalizes agreement and comfort with this plan and seems reliable and able to return to the Emergency Department with worsening or changing symptoms.          Final Clinical Impression(s) / ED Diagnoses Final diagnoses:  Acute bilateral low back pain without sciatica  Epigastric pain    Rx / DC Orders ED Discharge Orders          Ordered    dicyclomine (BENTYL) 20 MG tablet  2 times daily        03/07/22 1729    famotidine (PEPCID) 20 MG tablet  2 times daily  03/07/22 1729              Renne Crigler, PA-C 03/07/22 1733    Glynn Octave, MD 03/07/22 Jerene Bears

## 2022-03-07 NOTE — Discharge Instructions (Signed)
Please read and follow all provided instructions.  Your diagnoses today include:  1. Acute bilateral low back pain without sciatica   2. Epigastric pain     Tests performed today include: Blood cell counts and platelets Kidney and liver function tests: slightly weak kidney function Pancreas function test (called lipase) Urine test to look for infection A blood or urine test for pregnancy (women only): showed some blood and calcium oxalate crystals CT scan: no signs of kidney stones or other problems Vital signs. See below for your results today.   Medications prescribed:  Bentyl - medication for intestinal cramps and spasms  Pepcid (famotidine) - antihistamine  You can find this medication over-the-counter.   DO NOT exceed:  20mg  Pepcid every 12 hours  Take any prescribed medications only as directed.  Home care instructions:  Follow any educational materials contained in this packet.  Follow-up instructions: Please follow-up with your primary care provider in the next 3 days for further evaluation of your symptoms.    Return instructions:  SEEK IMMEDIATE MEDICAL ATTENTION IF: The pain does not go away or becomes severe  A temperature above 101F develops  Repeated vomiting occurs (multiple episodes)  The pain becomes localized to portions of the abdomen. The right side could possibly be appendicitis. In an adult, the left lower portion of the abdomen could be colitis or diverticulitis.  Blood is being passed in stools or vomit (bright red or black tarry stools)  You develop chest pain, difficulty breathing, dizziness or fainting, or become confused, poorly responsive, or inconsolable (young children) If you have any other emergent concerns regarding your health  Additional Information: Abdominal (belly) pain can be caused by many things. Your caregiver performed an examination and possibly ordered blood/urine tests and imaging (CT scan, x-rays, ultrasound). Many cases can  be observed and treated at home after initial evaluation in the emergency department. Even though you are being discharged home, abdominal pain can be unpredictable. Therefore, you need a repeated exam if your pain does not resolve, returns, or worsens. Most patients with abdominal pain don't have to be admitted to the hospital or have surgery, but serious problems like appendicitis and gallbladder attacks can start out as nonspecific pain. Many abdominal conditions cannot be diagnosed in one visit, so follow-up evaluations are very important.  Your vital signs today were: BP 111/72   Pulse 70   Temp 97.7 F (36.5 C) (Oral)   Resp 18   Ht 5\' 3"  (1.6 m)   Wt 86.2 kg   SpO2 100%   BMI 33.66 kg/m  If your blood pressure (bp) was elevated above 135/85 this visit, please have this repeated by your doctor within one month. --------------

## 2022-03-07 NOTE — ED Triage Notes (Signed)
C/O epigastric pain and lower back pain started over the weekend along with nausea that progressively been getting worst; denies painful urination; bowel movement earlier today, more loose than usual; stated have not been able to eat lately. Hx of C section, denies any current home medication or active medical problem.

## 2022-03-09 LAB — URINE CULTURE

## 2022-03-10 ENCOUNTER — Emergency Department (HOSPITAL_BASED_OUTPATIENT_CLINIC_OR_DEPARTMENT_OTHER)
Admission: EM | Admit: 2022-03-10 | Discharge: 2022-03-10 | Disposition: A | Discharge status: Home / Self Care | Payer: Self-pay | Attending: Emergency Medicine | Admitting: Emergency Medicine

## 2022-03-10 ENCOUNTER — Emergency Department (HOSPITAL_BASED_OUTPATIENT_CLINIC_OR_DEPARTMENT_OTHER): Payer: Self-pay

## 2022-03-10 ENCOUNTER — Other Ambulatory Visit: Payer: Self-pay

## 2022-03-10 DIAGNOSIS — S0031XA Abrasion of nose, initial encounter: Secondary | ICD-10-CM | POA: Insufficient documentation

## 2022-03-10 DIAGNOSIS — R451 Restlessness and agitation: Secondary | ICD-10-CM | POA: Insufficient documentation

## 2022-03-10 DIAGNOSIS — W010XXA Fall on same level from slipping, tripping and stumbling without subsequent striking against object, initial encounter: Secondary | ICD-10-CM | POA: Insufficient documentation

## 2022-03-10 DIAGNOSIS — S0993XA Unspecified injury of face, initial encounter: Secondary | ICD-10-CM

## 2022-03-10 DIAGNOSIS — R519 Headache, unspecified: Secondary | ICD-10-CM | POA: Insufficient documentation

## 2022-03-10 LAB — CBC
HCT: 41.7 % (ref 36.0–46.0)
Hemoglobin: 13.6 g/dL (ref 12.0–15.0)
MCH: 28.3 pg (ref 26.0–34.0)
MCHC: 32.6 g/dL (ref 30.0–36.0)
MCV: 86.9 fL (ref 80.0–100.0)
Platelets: 349 10*3/uL (ref 150–400)
RBC: 4.8 MIL/uL (ref 3.87–5.11)
RDW: 11.7 % (ref 11.5–15.5)
WBC: 6.3 10*3/uL (ref 4.0–10.5)
nRBC: 0 % (ref 0.0–0.2)

## 2022-03-10 LAB — BASIC METABOLIC PANEL
Anion gap: 12 (ref 5–15)
BUN: 12 mg/dL (ref 6–20)
CO2: 26 mmol/L (ref 22–32)
Calcium: 10.3 mg/dL (ref 8.9–10.3)
Chloride: 101 mmol/L (ref 98–111)
Creatinine, Ser: 0.8 mg/dL (ref 0.44–1.00)
GFR, Estimated: >60 mL/min (ref >60)
Glucose, Bld: 123 mg/dL — ABNORMAL HIGH (ref 70–99)
Potassium: 3.1 mmol/L — ABNORMAL LOW (ref 3.5–5.1)
Sodium: 139 mmol/L (ref 135–145)

## 2022-03-10 LAB — TROPONIN I (HIGH SENSITIVITY): Troponin I (High Sensitivity): 3 ng/L (ref <18)

## 2022-03-10 LAB — PREGNANCY, URINE: Preg Test, Ur: NEGATIVE

## 2022-03-10 MED ORDER — LORAZEPAM 1 MG PO TABS: 1.0000 mg | ORAL_TABLET | Freq: Once | ORAL | Status: DC

## 2022-03-10 MED ORDER — LORAZEPAM 2 MG/ML IJ SOLN
1.0000 mg | Freq: Once | INTRAMUSCULAR | Status: AC
  Administered 2022-03-10: 1 mg via INTRAMUSCULAR
  Filled 2022-03-10: qty 1

## 2022-03-10 MED ORDER — HYDROMORPHONE HCL 1 MG/ML IJ SOLN
2.0000 mg | Freq: Once | INTRAMUSCULAR | Status: AC
  Administered 2022-03-10: 2 mg via INTRAMUSCULAR
  Filled 2022-03-10: qty 2

## 2022-03-10 MED ORDER — LORAZEPAM 1 MG PO TABS
2.0000 mg | ORAL_TABLET | Freq: Once | ORAL | Status: AC
  Administered 2022-03-10: 2 mg via ORAL
  Filled 2022-03-10: qty 2

## 2022-03-10 MED ORDER — HYDROMORPHONE HCL 1 MG/ML IJ SOLN: 1.0000 mg | Freq: Once | INTRAMUSCULAR | Status: DC

## 2022-03-10 NOTE — Discharge Instructions (Signed)
You were evaluated in the Emergency Department and after careful evaluation, we did not find any emergent condition requiring admission or further testing in the hospital.  Your exam/testing today was overall reassuring.  Symptoms may be related to anxiety or medication side effect or possibly a concussion.  Recommend rest for the next few days.  Please return to the Emergency Department if you experience any worsening of your condition.  Thank you for allowing [place] to be a part of your care.

## 2022-03-10 NOTE — ED Triage Notes (Signed)
Pt via POV with roommate/close friend. Pt c/o restlessness, nausea, CP, and palpations that started 1:30 am after taking seroquel. Pt states feeling "twitchy."   Pt had mechanical fall yesterday falling face forward around 2-3 pm, did not seek medical attention. Pt took Seroquel to help with sleep, naproxen, and tylenol yesterday afternoon, believes s/s after secondary to reaction to Seroquel. Denies drug use/alcohol.

## 2022-03-10 NOTE — ED Provider Notes (Signed)
[facility]ERGENC[facility] Emergency Department Provider Note MRN:  [id number]  Arrival date & time: 03/10/22     Chief Complaint   Restlessness   History of Present Illness   Heidi Pearson is a 46 y.o. year-old female with a history of anxiety presenting to the ED with chief complaint of restlessness.  Patient tripped and fell earlier this evening, landed on her face, felt pretty shaken up after the fall.  Trouble sleeping this evening due to discomfort to the face, headache, feeling restless.  Tried to take his Seroquel for sleep but it did not help, made it worse.  Feeling more restless, jittery.  Review of Systems  A thorough review of systems was obtained and all systems are negative except as noted in the HPI and PMH.   Patient's Health History    Past Medical History:  Diagnosis Date   ADD (attention deficit disorder)    Anxiety    Anxiety    Chronic knee pain    Depression    PTSD (post-traumatic stress disorder)     Past Surgical History:  Procedure Laterality Date   CESAREAN SECTION      Family History  Problem Relation Age of Onset   Hypertension Mother    Cancer Mother    Hypertension Father     Social History   Socioeconomic History   Marital status: Divorced    Spouse name: Not on file   Number of children: Not on file   Years of education: Not on file   Highest education level: Not on file  Occupational History   Not on file  Tobacco Use   Smoking status: Never   Smokeless tobacco: Never  Vaping Use   Vaping Use: Never used  Substance and Sexual Activity   Alcohol use: Yes    Comment: socially    Drug use: No    Types: Oxycodone, Hydrocodone    Comment: Quit 04/11/2012 herion two years ago   Sexual activity: Not Currently    Birth control/protection: None  Other Topics Concern   Not on file  Social History Narrative   Not on file   Social Determinants of Health   Financial Resource Strain: Not on file  Food Insecurity:  No Food Insecurity (01/18/2022)   Hunger Vital Sign    Worried About Running Out of Food in the Last Year: Never true    Ran Out of Food in the Last Year: Never true  Transportation Needs: No Transportation Needs (01/25/2022)   PRAPARE - Administrator, Civil Service (Medical): No    Lack of Transportation (Non-Medical): No  Physical Activity: Not on file  Stress: Not on file  Social Connections: Not on file  Intimate Partner Violence: Not At Risk (01/25/2022)   Humiliation, Afraid, Rape, and Kick questionnaire    Fear of Current or Ex-Partner: No    Emotionally Abused: No    Physically Abused: No    Sexually Abused: No     Physical Exam   Vitals:   03/10/22 0500 03/10/22 0600  BP: 138/86 136/85  Pulse: 85 81  Resp: (!) 26 17  Temp:    SpO2: 98% 98%    CONSTITUTIONAL: Well-appearing, NAD NEURO/PSYCH:  Alert and oriented x 3, normal and symmetric strength and sensation, normal coordination, normal speech EYES:  eyes equal and reactive ENT/NECK:  no LAD, no JVD CARDIO: Regular rate, well-perfused, normal S1 and S2 PULM:  CTAB no wheezing or rhonchi GI/GU:  non-distended, non-tender  MSK/SPINE:  No gross deformities, no edema SKIN:  no rash, abrasion to the nose   *Additional and/or pertinent findings included in MDM below  Diagnostic and Interventional Summary    EKG Interpretation  Date/Time:  Friday March 10 2022 04:36:37 EDT Ventricular Rate:  85 PR Interval:  191 QRS Duration: 93 QT Interval:  374 QTC Calculation: 445 R Axis:   100 Text Interpretation: Sinus rhythm Right axis deviation Low voltage, precordial leads Probable anteroseptal infarct, old Confirmed by Gerlene Fee [phone]) on 03/10/2022 5:01:36 AM       Labs Reviewed  BASIC METABOLIC PANEL - Abnormal; Notable for the following components:      Result Value   Potassium 3.1 (*)    Glucose, Bld 123 (*)    All other components within normal limits  CBC  PREGNANCY, URINE  TROPONIN I  (HIGH SENSITIVITY)    CT HEAD WO CONTRAST (5MM)  Final Result    CT Cervical Spine Wo Contrast  Final Result    CT Maxillofacial Wo Contrast  Final Result      Medications  LORazepam (ATIVAN) tablet 2 mg (2 mg Oral Given 03/10/22 0431)  LORazepam (ATIVAN) injection 1 mg (1 mg Intramuscular Given 03/10/22 0503)  HYDROmorphone (DILAUDID) injection 2 mg (2 mg Intramuscular Given 03/10/22 0543)     Procedures  /  Critical Care Procedures  ED Course and Medical Decision Making  Initial Impression and Ddx Symptoms seem largely anxiety driven.  Jitteriness, tremulousness, chest tightness but not currently.  She admits to feeling very anxious.  Also considering concussion, intracranial bleeding given the fall with no head trauma.  Roommate in the room says that patient was acting confused earlier in the evening.  Medication side effect also considered given the patient's symptoms worsened after trying Seroquel.  Past medical/surgical history that increases complexity of ED encounter: Anxiety  Interpretation of Diagnostics I personally reviewed the laboratory assessment and my interpretation is as follows: No significant blood count or electrolyte disturbance, troponin negative  CT imaging unremarkable.  Patient Reassessment and Ultimate Disposition/Management     Appropriate for discharge.  Patient management required discussion with the following services or consulting groups:  None  Complexity of Problems Addressed Acute illness or injury that poses threat of life of bodily function  Additional Data Reviewed and Analyzed Further history obtained from: Further history from spouse/family member  Additional Factors Impacting ED Encounter Risk None  Barth Kirks. Sedonia Small, [place] [email or web address]  Final Clinical Impressions(s) / ED Diagnoses     ICD-10-CM   1. Facial injury, initial encounter  S09.93XA     2.  Restlessness  R45.1       ED Discharge Orders     None        Discharge Instructions Discussed with and Provided to Patient:     Discharge Instructions      You were evaluated in the Emergency Department and after careful evaluation, we did not find any emergent condition requiring admission or further testing in the hospital.  Your exam/testing today was overall reassuring.  Symptoms may be related to anxiety or medication side effect or possibly a concussion.  Recommend rest for the next few days.  Please return to the Emergency Department if you experience any worsening of your condition.  Thank you for allowing [place] to be a part of your care.        Maudie Flakes, MD 03/10/22 [phone]

## 2022-03-27 NOTE — Progress Notes (Deleted)
    Heidi Pearson D.[facility] [address] [place] [address] Phone: [phone]   Assessment and Plan:     There are no diagnoses linked to this encounter.  ***   Pertinent previous records reviewed include ***   Follow Up: ***     Subjective:   I, Heidi Pearson, am serving as a Neurosurgeon for Heidi Pearson  Chief Complaint: left hip pain   HPI:   03/28/2022 Patient is a 46 year old female complaining of left hip pain. Patient states  Relevant Historical Information: ***  Additional pertinent review of systems negative.   Current Outpatient Medications:    dicyclomine (BENTYL) 20 MG tablet, Take 1 tablet (20 mg total) by mouth 2 (two) times daily., Disp: 20 tablet, Rfl: 0   famotidine (PEPCID) 20 MG tablet, Take 1 tablet (20 mg total) by mouth 2 (two) times daily., Disp: 20 tablet, Rfl: 0   hydrOXYzine (ATARAX) 50 MG tablet, Take 1 tablet (50 mg total) by mouth every 6 (six) hours as needed for anxiety., Disp: 20 tablet, Rfl: 0   melatonin 5 MG TABS, Take 0.5 tablets (2.5 mg total) by mouth at bedtime., Disp: 10 tablet, Rfl: 0   naproxen (NAPROSYN) 500 MG tablet, Take 1 tablet (500 mg total) by mouth 2 (two) times daily as needed for moderate pain, headache or mild pain., Disp: 20 tablet, Rfl: 0   QUEtiapine (SEROQUEL) 200 MG tablet, Take 1 tablet (200 mg total) by mouth at bedtime., Disp: 10 tablet, Rfl: 0   Objective:     There were no vitals filed for this visit.    There is no height or weight on file to calculate BMI.    Physical Exam:    ***   Electronically signed by:  Heidi Pearson [facility] 4:31 PM 03/27/22

## 2022-03-28 ENCOUNTER — Ambulatory Visit: Payer: Self-pay | Admitting: Sports Medicine

## 2022-03-31 ENCOUNTER — Other Ambulatory Visit: Payer: Self-pay

## 2022-03-31 ENCOUNTER — Emergency Department (HOSPITAL_BASED_OUTPATIENT_CLINIC_OR_DEPARTMENT_OTHER)
Admission: EM | Admit: 2022-03-31 | Discharge: 2022-03-31 | Disposition: A | Discharge status: Home / Self Care | Payer: Self-pay | Attending: Emergency Medicine | Admitting: Emergency Medicine

## 2022-03-31 ENCOUNTER — Encounter (HOSPITAL_BASED_OUTPATIENT_CLINIC_OR_DEPARTMENT_OTHER): Payer: Self-pay

## 2022-03-31 ENCOUNTER — Emergency Department (HOSPITAL_BASED_OUTPATIENT_CLINIC_OR_DEPARTMENT_OTHER): Payer: Self-pay | Admitting: Radiology

## 2022-03-31 DIAGNOSIS — E876 Hypokalemia: Secondary | ICD-10-CM | POA: Insufficient documentation

## 2022-03-31 DIAGNOSIS — F419 Anxiety disorder, unspecified: Secondary | ICD-10-CM | POA: Insufficient documentation

## 2022-03-31 DIAGNOSIS — R0789 Other chest pain: Secondary | ICD-10-CM | POA: Insufficient documentation

## 2022-03-31 LAB — CBC
HCT: 39.6 % (ref 36.0–46.0)
Hemoglobin: 13 g/dL (ref 12.0–15.0)
MCH: 28.6 pg (ref 26.0–34.0)
MCHC: 32.8 g/dL (ref 30.0–36.0)
MCV: 87 fL (ref 80.0–100.0)
Platelets: 365 10*3/uL (ref 150–400)
RBC: 4.55 MIL/uL (ref 3.87–5.11)
RDW: 12.5 % (ref 11.5–15.5)
WBC: 8 10*3/uL (ref 4.0–10.5)
nRBC: 0 % (ref 0.0–0.2)

## 2022-03-31 LAB — BASIC METABOLIC PANEL
Anion gap: 12 (ref 5–15)
BUN: 12 mg/dL (ref 6–20)
CO2: 26 mmol/L (ref 22–32)
Calcium: 10 mg/dL (ref 8.9–10.3)
Chloride: 103 mmol/L (ref 98–111)
Creatinine, Ser: 0.71 mg/dL (ref 0.44–1.00)
GFR, Estimated: >60 mL/min (ref >60)
Glucose, Bld: 101 mg/dL — ABNORMAL HIGH (ref 70–99)
Potassium: 3.2 mmol/L — ABNORMAL LOW (ref 3.5–5.1)
Sodium: 141 mmol/L (ref 135–145)

## 2022-03-31 LAB — TROPONIN I (HIGH SENSITIVITY): Troponin I (High Sensitivity): 3 ng/L (ref <18)

## 2022-03-31 MED ORDER — CLONAZEPAM 0.5 MG PO TABS: 0.5000 mg | ORAL_TABLET | Freq: Two times a day (BID) | ORAL | 0 refills | Status: DC | PRN

## 2022-03-31 MED ORDER — CLONAZEPAM 0.5 MG PO TABS: 0.5000 mg | ORAL_TABLET | Freq: Two times a day (BID) | ORAL | 0 refills | Status: AC | PRN

## 2022-03-31 MED ORDER — DIAZEPAM 5 MG PO TABS
5.0000 mg | ORAL_TABLET | Freq: Once | ORAL | Status: AC
  Administered 2022-03-31: 5 mg via ORAL
  Filled 2022-03-31 (×2): qty 1

## 2022-03-31 MED ORDER — POTASSIUM CHLORIDE ER 10 MEQ PO TBCR: 10.0000 meq | EXTENDED_RELEASE_TABLET | Freq: Two times a day (BID) | ORAL | 0 refills | Status: AC

## 2022-03-31 MED ORDER — ONDANSETRON 4 MG PO TBDP: 4.0000 mg | ORAL_TABLET | Freq: Three times a day (TID) | ORAL | 0 refills | Status: AC | PRN

## 2022-03-31 NOTE — ED Notes (Signed)
Triage note 

## 2022-03-31 NOTE — ED Triage Notes (Signed)
States last couple of days been having anxiety and feeling panic.  Believes having benzo withdraw.  States running out of medications for her anxiety  States having left side chest pain radiating into back  states has also been nausea and vomiting

## 2022-03-31 NOTE — ED Provider Notes (Signed)
[facility] EMERGENCY DEPT Provider Note   CSN: [id number] Arrival date & time: 03/31/22  1741     History  Chief Complaint  Patient presents with   Panic Attack    With chest pain    Heidi Pearson is a 46 y.o. female.  With PMH of anxiety, depression, alcohol use, polysubstance use who presents with anxiety and chest tightness.  She used to previously be on 1 mg twice daily Klonopin prescribed by her PCP but recently discontinued it.  However since then she has been having anxiety, chest tightness, nausea and diarrhea.  She was recently given a prescription for Xanax but feels like it is not helping.  She has only 2 pills left.  She denies any alcohol or other concurrent drug use.  There is no shortness of breath, recent fevers or sickness.  She has no leg pain or swelling.  No history of PE, DVT and is not on OCP.  She denies SI/HI/AVH.  HPI     Home Medications Prior to Admission medications   Medication Sig Start Date End Date Taking? Authorizing Provider  ondansetron (ZOFRAN-ODT) 4 MG disintegrating tablet Take 1 tablet (4 mg total) by mouth every 8 (eight) hours as needed for nausea or vomiting. 03/31/22  Yes Mardene Sayer, MD  potassium chloride (KLOR-CON) 10 MEQ tablet Take 1 tablet (10 mEq total) by mouth 2 (two) times daily for 5 days. 03/31/22 04/05/22 Yes Mardene Sayer, MD  clonazePAM (KLONOPIN) 0.5 MG tablet Take 1 tablet (0.5 mg total) by mouth 2 (two) times daily as needed for up to 10 doses for anxiety. 03/31/22   Mardene Sayer, MD  dicyclomine (BENTYL) 20 MG tablet Take 1 tablet (20 mg total) by mouth 2 (two) times daily. 03/07/22   Renne Crigler, PA-C  famotidine (PEPCID) 20 MG tablet Take 1 tablet (20 mg total) by mouth 2 (two) times daily. 03/07/22   Renne Crigler, PA-C  hydrOXYzine (ATARAX) 50 MG tablet Take 1 tablet (50 mg total) by mouth every 6 (six) hours as needed for anxiety. 01/25/22   Clapacs, Jackquline Denmark, MD  melatonin 5 MG  TABS Take 0.5 tablets (2.5 mg total) by mouth at bedtime. 01/25/22   Clapacs, Jackquline Denmark, MD  naproxen (NAPROSYN) 500 MG tablet Take 1 tablet (500 mg total) by mouth 2 (two) times daily as needed for moderate pain, headache or mild pain. 01/25/22   Clapacs, Jackquline Denmark, MD  QUEtiapine (SEROQUEL) 200 MG tablet Take 1 tablet (200 mg total) by mouth at bedtime. 01/25/22   Clapacs, Jackquline Denmark, MD      Allergies    Morphine and related and Paxil [paroxetine]    Review of Systems   Review of Systems  Physical Exam Updated Vital Signs BP (!) 150/98   Pulse 65   Temp 98.3 F (36.8 C)   Resp 18   Ht 5\' 3"  (1.6 m)   Wt 81.6 kg   LMP 03/19/2022 (Approximate)   SpO2 99%   BMI 31.89 kg/m  Physical Exam Constitutional: Alert and oriented.  Anxious but nontoxic Eyes: Conjunctivae are normal. ENT      Head: Normocephalic and atraumatic. Cardiovascular: S1, S2, regular rate and rhythm.  Warm and well perfused. Respiratory: Normal respiratory effort. Breath sounds are normal.  O2 sat 99 on RA Gastrointestinal: Soft and nontender.  Musculoskeletal: Normal range of motion in all extremities.      Right lower leg: No tenderness or edema.  Left lower leg: No tenderness or edema. Neurologic: Normal speech and language. No gross focal neurologic deficits are appreciated. Skin: Skin is warm, dry and intact. No rash noted. Psychiatric: Anxious   ED Results / Procedures / Treatments   Labs (all labs ordered are listed, but only abnormal results are displayed) Labs Reviewed  BASIC METABOLIC PANEL - Abnormal; Notable for the following components:      Result Value   Potassium 3.2 (*)    Glucose, Bld 101 (*)    All other components within normal limits  CBC  TROPONIN I (HIGH SENSITIVITY)    EKG None  Radiology DG Chest 2 View  Result Date: 03/31/2022 CLINICAL DATA:  Chest pain EXAM: CHEST - 2 VIEW COMPARISON:  Chest x-ray 01/16/2022.  CT chest 01/16/2022. FINDINGS: The heart size and mediastinal  contours are within normal limits. Both lungs are clear. The visualized skeletal structures are unremarkable. IMPRESSION: No active cardiopulmonary disease. Electronically Signed   By: Darliss Cheney M.D.   On: 03/31/2022 18:30    Procedures Procedures    Medications Ordered in ED Medications  diazepam (VALIUM) tablet 5 mg (5 mg Oral Given 03/31/22 2106)    ED Course/ Medical Decision Making/ A&P                           Medical Decision Making  Heidi Pearson is a 46 y.o. female.  With PMH of anxiety, depression, alcohol use, polysubstance use who presents with anxiety and chest tightness.   Regarding the patient's chest pain, their HEART score is 1 and overall have an EKG which is reassuring and unchanged from previous.  High sensitive troponin obtained greater than 3 hours since start of pain which was 3 and reassuring.  Unlikely ACS.Marland Kitchen  Chest x-ray obtained which I personally reviewed which showed no evidence for pneumonia, pneumothorax, and pulmonary edema. I do not think PE because patient is PERC negative.They have had no recent instrumentation such as endoscopy, have no crepitus of the chest and are overall well appearing making Booerhave's unlikely.  Her symptoms are likely all anxiety driven versus possible benzo withdrawal.  Her CIWA score is approximately 7.  She denies any concurrent alcohol use.  I discussed the importance of follow-up with her PCP regarding her symptoms as well as be [facility] information for follow-up.  I have given her a very short course of Klonopin with 10 pills.  Zofran as needed for symptom control.  Also gave prescription for potassium pills for potassium repletion for mild hypokalemia 3.2.  She is no emergent need for hospitalization at this time nor does she require behavioral health evaluation.  I discussed tricked return precautions. She is safe for discharge.   Amount and/or Complexity of Data Reviewed Labs: ordered. Radiology:  ordered.  Risk Prescription drug management.    Final Clinical Impression(s) / ED Diagnoses Final diagnoses:  Anxiety  Atypical chest pain  Hypokalemia    Rx / DC Orders ED Discharge Orders          Ordered    clonazePAM (KLONOPIN) 0.5 MG tablet  2 times daily PRN,   Status:  Discontinued        03/31/22 2107    clonazePAM (KLONOPIN) 0.5 MG tablet  2 times daily PRN,   Status:  Discontinued        03/31/22 2154    clonazePAM (KLONOPIN) 0.5 MG tablet  2 times daily PRN  03/31/22 2200    potassium chloride (KLOR-CON) 10 MEQ tablet  2 times daily        03/31/22 2307    ondansetron (ZOFRAN-ODT) 4 MG disintegrating tablet  Every 8 hours PRN        03/31/22 2315              Mardene Sayer, MD 03/31/22 2316

## 2022-03-31 NOTE — Discharge Instructions (Signed)
You were seen in the emergency department today for chest pain and anxiety.  Overall your medical work-up was reassuring.  You have been given a short course of Klonopin but is extremely important you go to the behavioral health center or follow-up with your primary care doctor regarding your symptoms and prescription.  Do not take this medication with alcohol or other drugs.  Return to the emergency department immediately if you develop recurrent, severe chest pain, shortness of breath, fainting spells, sudden sweatiness, or any other concerning symptoms.   Please also make an appointment to follow up with your primary care doctor or cardiologist within one week to assure improvement or resolution in symptoms. Further testing may be necessary, so it is extremely important to keep your follow-up appointment with your primary doctor.

## 2022-03-31 NOTE — ED Notes (Signed)
Discharge paperwork given and verbally understood. 

## 2022-04-18 ENCOUNTER — Ambulatory Visit: Payer: Self-pay | Admitting: Physician Assistant

## 2022-04-27 ENCOUNTER — Encounter: Payer: Self-pay | Admitting: *Deleted

## 2022-05-10 ENCOUNTER — Other Ambulatory Visit: Payer: Self-pay

## 2022-05-10 DIAGNOSIS — M25552 Pain in left hip: Secondary | ICD-10-CM | POA: Insufficient documentation

## 2022-05-10 NOTE — ED Triage Notes (Signed)
Chronic left hip pain that is progressively worse tonight. Pt states hip pain radiates down left leg to the front of leg and states it feels liek shin splints and spasms are excruciating tonight. No relief from ibuprofen. No new injury.

## 2022-05-10 NOTE — ED Notes (Signed)
Please print and scripts needed.

## 2022-05-11 ENCOUNTER — Emergency Department (HOSPITAL_BASED_OUTPATIENT_CLINIC_OR_DEPARTMENT_OTHER): Payer: Self-pay

## 2022-05-11 ENCOUNTER — Emergency Department (HOSPITAL_BASED_OUTPATIENT_CLINIC_OR_DEPARTMENT_OTHER)
Admission: EM | Admit: 2022-05-11 | Discharge: 2022-05-11 | Disposition: A | Discharge status: Home / Self Care | Payer: Self-pay | Attending: Emergency Medicine | Admitting: Emergency Medicine

## 2022-05-11 DIAGNOSIS — M25552 Pain in left hip: Secondary | ICD-10-CM

## 2022-05-11 LAB — PREGNANCY, URINE: Preg Test, Ur: NEGATIVE

## 2022-05-11 MED ORDER — MELOXICAM 7.5 MG PO TABS: 7.5000 mg | ORAL_TABLET | Freq: Every day | ORAL | 0 refills | Status: DC

## 2022-05-11 MED ORDER — DICLOFENAC SODIUM 1 % EX GEL: 2.0000 g | Freq: Four times a day (QID) | CUTANEOUS | 0 refills | Status: DC | PRN

## 2022-05-11 MED ORDER — MELOXICAM 7.5 MG PO TABS: 7.5000 mg | ORAL_TABLET | Freq: Every day | ORAL | 0 refills | Status: AC

## 2022-05-11 MED ORDER — DICLOFENAC SODIUM 1 % EX GEL: 2.0000 g | Freq: Four times a day (QID) | CUTANEOUS | 0 refills | Status: AC | PRN

## 2022-05-11 MED ORDER — KETOROLAC TROMETHAMINE 30 MG/ML IJ SOLN
30.0000 mg | Freq: Once | INTRAMUSCULAR | Status: AC
  Administered 2022-05-11: 30 mg via INTRAMUSCULAR
  Filled 2022-05-11: qty 1

## 2022-05-11 NOTE — ED Notes (Signed)
Called lab to add on UPreg

## 2022-05-11 NOTE — Discharge Instructions (Addendum)
We believe that your symptoms are caused by musculoskeletal strain or arthritis.  Please read through the included information about additional care such as heating pads, over-the-counter pain medicine.  If you were provided a prescription please use it only as needed and as instructed.  Remember that early mobility and using the affected part of your body is actually better than keeping it immobile.  If you are taking the meloxicam, you do not need to take any additional Naproxen or Ibuprofen. You may take additional tylenol.   Follow-up with the doctor listed as recommended or return to the emergency department with new or worsening symptoms that concern you.

## 2022-05-11 NOTE — ED Provider Notes (Signed)
Emergency Department Provider Note   I have reviewed the triage vital signs and the nursing notes.   HISTORY  Chief Complaint Hip Pain   HPI Heidi Pearson is a 46 y.o. female presents emergency department for evaluation of left hip pain.  Pain radiates down the left leg and culminates in spasm like pain in the lower extremity.  No color change.  No injury.  Patient has had pain in the left leg and sciatica in the past.  She is tried ibuprofen with no injury.  No numbness/weakness.  Past Medical History:  Diagnosis Date   ADD (attention deficit disorder)    Anxiety    Anxiety    Chronic knee pain    Depression    PTSD (post-traumatic stress disorder)     Review of Systems  Constitutional: No fever/chills Cardiovascular: Denies chest pain. Respiratory: Denies shortness of breath. Gastrointestinal: No abdominal pain.  No nausea, no vomiting.  No diarrhea.  No constipation. Genitourinary: Negative for dysuria. Musculoskeletal: Negative for back pain. Positive left hip and leg pain.  Skin: Negative for rash. Neurological: Negative for headaches, focal weakness or numbness.   ____________________________________________   PHYSICAL EXAM:  VITAL SIGNS: ED Triage Vitals  Enc Vitals Group     BP 05/10/22 2248 139/84     Pulse Rate 05/10/22 2248 70     Resp 05/10/22 2248 18     Temp 05/10/22 2248 97.8 F (36.6 C)     Temp src --      SpO2 05/10/22 2248 97 %     Weight 05/10/22 2254 200 lb (90.7 kg)     Height 05/10/22 2254 5\' 3"  (1.6 m)   Constitutional: Alert and oriented. Well appearing and in no acute distress. Eyes: Conjunctivae are normal.  Head: Atraumatic. Nose: No congestion/rhinnorhea. Mouth/Throat: Mucous membranes are moist.   Neck: No stridor. No cervical spine tenderness to palpation. Cardiovascular: Normal rate, regular rhythm. Good peripheral circulation. Grossly normal heart sounds.   Respiratory: Normal respiratory effort.  No retractions.  Lungs CTAB. Gastrointestinal: Soft and nontender. No distention.  Musculoskeletal: No lower extremity tenderness nor edema. No gross deformities of extremities.  Normal range of motion of the bilateral hips, knees, ankles.  No joint erythema or warmth. Neurologic:  Normal speech and language. No gross focal neurologic deficits are appreciated.  Skin:  Skin is warm, dry and intact. No rash noted. ____________________________________________   LABS (all labs ordered are listed, but only abnormal results are displayed)  Labs Reviewed  PREGNANCY, URINE   ____________________________________________   PROCEDURES  Procedure(s) performed:   Procedures  None  ____________________________________________   INITIAL IMPRESSION / ASSESSMENT AND PLAN / ED COURSE  Pertinent labs & imaging results that were available during my care of the patient were reviewed by me and considered in my medical decision making (see chart for details).   This patient is Presenting for Evaluation of leg pain, which does require a range of treatment options, and is a complaint that involves a moderate risk of morbidity and mortality.  The Differential Diagnoses includes but is not exclusive to musculoskeletal back pain, renal colic, urinary tract infection, pyelonephritis, intra-abdominal causes of back pain, aortic aneurysm or dissection, cauda equina syndrome, sciatica, lumbar disc disease, thoracic disc disease, etc.   Critical Interventions-    Medications  ketorolac (TORADOL) 30 MG/ML injection 30 mg (30 mg Intramuscular Given 05/11/22 0321)    Reassessment after intervention:  Pain improved.   Clinical Laboratory Tests Ordered, included pregnancy  negative.   Radiologic Tests Ordered, included hip x-ray. I independently interpreted the images and agree with radiology interpretation.    Medical Decision Making: Summary:  Presents to the emergency department for evaluation of leg pain.  X-ray shows  arthritis type changes but no fracture or dislocation.  No red flag signs or symptoms to prompt emergent imaging of the spine with MRI.  Lan for pain management and supportive care at home with referral to orthopedics.  Patient's presentation is most consistent with acute illness / injury with system symptoms.   Disposition:   ____________________________________________  FINAL CLINICAL IMPRESSION(S) / ED DIAGNOSES  Final diagnoses:  Left hip pain     NEW OUTPATIENT MEDICATIONS STARTED DURING THIS VISIT:  Discharge Medication List as of 05/11/2022  3:19 AM     START taking these medications   Details  diclofenac Sodium (VOLTAREN) 1 % GEL Apply 2 g topically 4 (four) times daily as needed., Starting Thu 05/11/2022, Normal    meloxicam (MOBIC) 7.5 MG tablet Take 1 tablet (7.5 mg total) by mouth daily for 10 days., Starting Thu 05/11/2022, Until Sun 05/21/2022, Normal        Note:  This document was prepared using Dragon voice recognition software and may include unintentional dictation errors.  Alona Bene, MD, [facility]    Naje Rice, Arlyss Repress, MD 05/16/22 [phone]

## 2022-06-02 ENCOUNTER — Other Ambulatory Visit: Payer: Self-pay

## 2022-06-02 ENCOUNTER — Emergency Department (HOSPITAL_BASED_OUTPATIENT_CLINIC_OR_DEPARTMENT_OTHER)
Admission: EM | Admit: 2022-06-02 | Discharge: 2022-06-02 | Disposition: A | Discharge status: Home / Self Care | Payer: Self-pay | Attending: Emergency Medicine | Admitting: Emergency Medicine

## 2022-06-02 ENCOUNTER — Encounter (HOSPITAL_BASED_OUTPATIENT_CLINIC_OR_DEPARTMENT_OTHER): Payer: Self-pay | Admitting: *Deleted

## 2022-06-02 DIAGNOSIS — G44201 Tension-type headache, unspecified, intractable: Secondary | ICD-10-CM | POA: Insufficient documentation

## 2022-06-02 LAB — HCG, QUANTITATIVE, PREGNANCY: hCG, Beta Chain, Quant, S: <1 m[IU]/mL (ref <5)

## 2022-06-02 MED ORDER — ONDANSETRON HCL 4 MG PO TABS: 4.0000 mg | ORAL_TABLET | Freq: Four times a day (QID) | ORAL | 0 refills | Status: AC

## 2022-06-02 MED ORDER — DIPHENHYDRAMINE HCL 50 MG/ML IJ SOLN
50.0000 mg | Freq: Once | INTRAMUSCULAR | Status: AC
  Administered 2022-06-02: 50 mg via INTRAVENOUS
  Filled 2022-06-02: qty 1

## 2022-06-02 MED ORDER — METOCLOPRAMIDE HCL 5 MG/ML IJ SOLN
10.0000 mg | Freq: Once | INTRAMUSCULAR | Status: AC
  Administered 2022-06-02: 10 mg via INTRAVENOUS
  Filled 2022-06-02: qty 2

## 2022-06-02 NOTE — ED Triage Notes (Signed)
Here by POV from home for "migraine" HA. H/o same. Onset yesterday. Associated with NV. Vomited x4. Denies fever, or urinary sx. Has PCP, does not have Neuro MD. No relief with excedrin, BC powder, tylenol, ibuprofen hot compresses or hot shower. Rates 7/10. Alert, NAD, calm, interactive, steady gait. No light guarding noted. Works in [employer]. No known sick contacts.

## 2022-06-02 NOTE — Discharge Instructions (Signed)
You were seen in the emergency department for your headache.  You had no signs of stroke or serious injury to your brain causing your headache.  You can continue to take Tylenol and Motrin as needed for your headache and both can be taken up to every 6 hours.  You should check with Excedrin and other medications that you are taking to make sure that you are not exceeding 4000 mg of Tylenol in a day.  You can take Zofran as needed for your nausea.  You should follow-up with your primary doctor in the next few days to have your symptoms rechecked and you can follow-up with neurology as needed for recurrent headaches.  You should return to the emergency department if you have numbness or weakness on one side of your body compared to the other, you have repetitive vomiting and are unable to keep anything down, or if you have any other new or concerning symptoms.

## 2022-06-02 NOTE — ED Provider Notes (Signed)
[facility] DE[facility] Provider Note   CSN: [id number] Arrival date & time: 06/02/22  0809     History  Chief Complaint  Patient presents with   Migraine    BENTLEE DRIER is a 47 y.o. female.  Patient is a 47 year old female with a past medical history of anxiety and depression presenting to the emergency department with headache.  The patient states that she has had a frontal headache that started yesterday afternoon and has been constant since it started.  She does not report the headache being sudden in onset and feels similar to prior headaches that she has had in the past.  She states that she got frequent headaches and her 25s and 30s but it has been several years since she has had a bad headache.  She states that her vision feels blurry with some spots in her vision but denies any numbness or weakness.  She states that she has had associated nausea.  She denies any fevers or chills, neck pain or stiffness.  That she has been taking Excedrin, ibuprofen, doing warm compresses at home without significant relief.  She states that her last Excedrin and ibuprofen were around 4 AM.  The history is provided by the patient.  Migraine       Home Medications Prior to Admission medications   Medication Sig Start Date End Date Taking? Authorizing Provider  ondansetron (ZOFRAN) 4 MG tablet Take 1 tablet (4 mg total) by mouth every 6 (six) hours. 06/02/22  Yes Maylon Peppers, [place] K, DO  clonazePAM (KLONOPIN) 0.5 MG tablet Take 1 tablet (0.5 mg total) by mouth 2 (two) times daily as needed for up to 10 doses for anxiety. 03/31/22   Elgie Congo, MD  diclofenac Sodium (VOLTAREN) 1 % GEL Apply 2 g topically 4 (four) times daily as needed. 05/11/22   Long, Wonda Olds, MD  dicyclomine (BENTYL) 20 MG tablet Take 1 tablet (20 mg total) by mouth 2 (two) times daily. 03/07/22   Carlisle Cater, PA-C  famotidine (PEPCID) 20 MG tablet Take 1 tablet (20 mg total) by mouth 2 (two)  times daily. 03/07/22   Carlisle Cater, PA-C  hydrOXYzine (ATARAX) 50 MG tablet Take 1 tablet (50 mg total) by mouth every 6 (six) hours as needed for anxiety. 01/25/22   Clapacs, Madie Reno, MD  melatonin 5 MG TABS Take 0.5 tablets (2.5 mg total) by mouth at bedtime. 01/25/22   Clapacs, Madie Reno, MD  naproxen (NAPROSYN) 500 MG tablet Take 1 tablet (500 mg total) by mouth 2 (two) times daily as needed for moderate pain, headache or mild pain. 01/25/22   Clapacs, Madie Reno, MD  ondansetron (ZOFRAN-ODT) 4 MG disintegrating tablet Take 1 tablet (4 mg total) by mouth every 8 (eight) hours as needed for nausea or vomiting. 03/31/22   Elgie Congo, MD  potassium chloride (KLOR-CON) 10 MEQ tablet Take 1 tablet (10 mEq total) by mouth 2 (two) times daily for 5 days. 03/31/22 04/05/22  Elgie Congo, MD  QUEtiapine (SEROQUEL) 200 MG tablet Take 1 tablet (200 mg total) by mouth at bedtime. 01/25/22   Clapacs, Madie Reno, MD      Allergies    Morphine and related and Paxil [paroxetine]    Review of Systems   Review of Systems  Physical Exam Updated Vital Signs BP 128/82 (BP Location: Right Arm)   Pulse 83   Temp 98.1 F (36.7 C) (Oral)   Resp 19   Wt 83.9 kg  LMP 05/26/2022 (Approximate)   SpO2 99%   BMI 32.77 kg/m  Physical Exam Vitals and nursing note reviewed.  Constitutional:      General: She is not in acute distress.    Appearance: Normal appearance.  HENT:     Head: Normocephalic and atraumatic.     Nose: Nose normal.     Mouth/Throat:     Mouth: Mucous membranes are moist.     Pharynx: Oropharynx is clear.  Eyes:     Extraocular Movements: Extraocular movements intact.     Conjunctiva/sclera: Conjunctivae normal.     Pupils: Pupils are equal, round, and reactive to light.  Cardiovascular:     Rate and Rhythm: Normal rate and regular rhythm.     Heart sounds: Normal heart sounds.  Pulmonary:     Effort: Pulmonary effort is normal.     Breath sounds: Normal breath sounds.   Abdominal:     General: Abdomen is flat.  Musculoskeletal:        General: Normal range of motion.     Cervical back: Normal range of motion and neck supple. No rigidity.  Skin:    General: Skin is warm and dry.  Neurological:     General: No focal deficit present.     Mental Status: She is alert and oriented to person, place, and time.     Cranial Nerves: No cranial nerve deficit.     Sensory: No sensory deficit.     Motor: No weakness.     Coordination: Coordination normal.  Psychiatric:        Mood and Affect: Mood normal.        Behavior: Behavior normal.     ED Results / Procedures / Treatments   Labs (all labs ordered are listed, but only abnormal results are displayed) Labs Reviewed  HCG, QUANTITATIVE, PREGNANCY  PREGNANCY, URINE    EKG None  Radiology No results found.  Procedures Procedures    Medications Ordered in ED Medications  metoCLOPramide (REGLAN) injection 10 mg (10 mg Intravenous Given 06/02/22 0908)  diphenhydrAMINE (BENADRYL) injection 50 mg (50 mg Intravenous Given 06/02/22 4098)    ED Course/ Medical Decision Making/ A&P Clinical Course as of 06/02/22 1016  Fri Jun 02, 2022  1014 Upon reassessment, the patient is pregnancy test was negative.  She reported no significant change in her headache but states that she would like to go home and sleep in her own bed.  The patient continues to be well-appearing and is stable for discharge.  She was recommended continue Tylenol and Motrin and close primary care follow-up. [VK]    Clinical Course User Index [VK] Kemper Durie, DO                             Medical Decision Making This patient presents to the ED with chief complaint(s) of headache with pertinent past medical history of anxiety and depression which further complicates the presenting complaint. The complaint involves an extensive differential diagnosis and also carries with it a high risk of complications and morbidity.    The  differential diagnosis includes patient has no focal neurologic deficits, headache was not sudden onset and is similar to prior headaches in the past making ICH or mass effect unlikely will likely tension headache, migraine headache no fevers or meningismus making meningitis unlikely  Additional history obtained: Additional history obtained from N/A Records reviewed prior ED records  ED Course and Reassessment:  Patient is awake alert well-appearing on arrival no acute distress with no focal neurologic deficits.  She was given Reglan and Benadryl for her headache, next due for Tylenol and NSAIDs around 10 AM.  She will have pregnancy test performed to evaluate for pregnancy or preeclampsia as cause of her headaches and she will be closely reassessed.  Independent labs interpretation:  The following labs were independently interpreted: pregnancy negative  Independent visualization of imaging: N/A  Consultation: - Consulted or discussed management/test interpretation w/ external professional: N/A  Consideration for admission or further workup: Patient has no emergent conditions requiring admission or further work-up at this time and is stable for discharge home with primary care follow-up  Social Determinants of health: N/A    Amount and/or Complexity of Data Reviewed Labs: ordered.  Risk Prescription drug management.          Final Clinical Impression(s) / ED Diagnoses Final diagnoses:  Acute intractable tension-type headache    Rx / DC Orders ED Discharge Orders          Ordered    ondansetron (ZOFRAN) 4 MG tablet  Every 6 hours        06/02/22 1014              Noble, [place] K, DO 06/02/22 1016

## 2022-06-02 NOTE — ED Notes (Signed)
Pt given discharge instructions and reviewed prescriptions. Opportunities given for questions. Pt verbalizes understanding. Leanne Chang, RN

## 2023-05-17 ENCOUNTER — Ambulatory Visit
Admission: EM | Admit: 2023-05-17 | Discharge: 2023-05-17 | Disposition: A | Discharge status: Home / Self Care | Payer: BC Managed Care – PPO | Attending: Family Medicine | Admitting: Family Medicine

## 2023-05-17 ENCOUNTER — Ambulatory Visit: Payer: BC Managed Care – PPO

## 2023-05-17 DIAGNOSIS — S93401A Sprain of unspecified ligament of right ankle, initial encounter: Secondary | ICD-10-CM | POA: Diagnosis not present

## 2023-05-17 DIAGNOSIS — S93601A Unspecified sprain of right foot, initial encounter: Secondary | ICD-10-CM | POA: Diagnosis not present

## 2023-05-17 NOTE — ED Triage Notes (Signed)
 Pt presents to UC for c/o right foot pain, swelling and bruising since yesterday. Pt states she tripped yesterday on scale in bathroom. Pt also reports neck and upper back pain since yesterday after the fall. Home remedies: Ibuprofen  and aleve , epsom salt, ice

## 2023-05-17 NOTE — Discharge Instructions (Signed)
 You may use the Ace wrap to your ankle and foot to help with swelling and support of the joint.  You may elevate and ice as needed.  Take over-the-counter Tylenol  or ibuprofen  as needed for pain.  You may use crutches to help you support the area while you walk.  Please follow-up with your PCP in 1 week if symptoms do not improve.  Please go to the ER for any worsening symptoms.  I hope you feel better soon!

## 2023-05-17 NOTE — ED Provider Notes (Signed)
 [facility]    CSN: [id number] Arrival date & time: 05/17/23  1654      History   Chief Complaint No chief complaint on file.   HPI Heidi Pearson is a 48 y.o. female presents for ankle and foot pain.  Patient reports yesterday she tripped over her scale at home causing her to roll her right ankle and foot.  Reports swelling pain and pain with weightbearing.  Intermittent tingling but no numbness or weakness.  No history of injuries or surgeries to the foot in the past.  She has been elevating and icing it with some improvement.  She also been taking ibuprofen  and Aleve .  She did work today and it caused the swelling to worsen.  No other injuries or concerns at this time.  HPI  Past Medical History:  Diagnosis Date   ADD (attention deficit disorder)    Anxiety    Anxiety    Chronic knee pain    Depression    PTSD (post-traumatic stress disorder)     Patient Active Problem List   Diagnosis Date Noted   Severe recurrent major depression without psychotic features (HCC) 01/18/2022   Adjustment disorder with mixed anxiety and depressed mood 01/18/2022   Benzodiazepine withdrawal (HCC) 01/18/2022   Opiate withdrawal (HCC) 01/18/2022   MDD (major depressive disorder), recurrent severe, without psychosis (HCC) 01/15/2022   Alcohol  dependence (HCC) 12/12/2013   ADHD (attention deficit hyperactivity disorder) 10/09/2012   Panic attacks 10/08/2012   PTSD (post-traumatic stress disorder) 10/08/2012   MDD (major depressive disorder) 10/08/2012   Chronic knee pain    Anxiety     Past Surgical History:  Procedure Laterality Date   CESAREAN SECTION      OB History   No obstetric history on file.      Home Medications    Prior to Admission medications   Medication Sig Start Date End Date Taking? Authorizing Provider  busPIRone  (BUSPAR ) 5 MG tablet Take 5 mg by mouth 2 (two) times daily. 04/26/23  Yes [provider]  clonazePAM  (KLONOPIN ) 0.5 MG  tablet Take 1 tablet (0.5 mg total) by mouth 2 (two) times daily as needed for up to 10 doses for anxiety. 03/31/22   Ethyl Richerd BROCKS, MD  diclofenac  Sodium (VOLTAREN ) 1 % GEL Apply 2 g topically 4 (four) times daily as needed. 05/11/22   Long, Fonda MATSU, MD  dicyclomine  (BENTYL ) 20 MG tablet Take 1 tablet (20 mg total) by mouth 2 (two) times daily. 03/07/22   Geiple, Joshua, PA-C  famotidine  (PEPCID ) 20 MG tablet Take 1 tablet (20 mg total) by mouth 2 (two) times daily. 03/07/22   Geiple, Joshua, PA-C  hydrOXYzine  (ATARAX ) 50 MG tablet Take 1 tablet (50 mg total) by mouth every 6 (six) hours as needed for anxiety. 01/25/22   Clapacs, Norleen DASEN, MD  melatonin 5 MG TABS Take 0.5 tablets (2.5 mg total) by mouth at bedtime. 01/25/22   Clapacs, Norleen DASEN, MD  naproxen  (NAPROSYN ) 500 MG tablet Take 1 tablet (500 mg total) by mouth 2 (two) times daily as needed for moderate pain, headache or mild pain. 01/25/22   Clapacs, Norleen DASEN, MD  ondansetron  (ZOFRAN ) 4 MG tablet Take 1 tablet (4 mg total) by mouth every 6 (six) hours. 06/02/22   Kingsley, Victoria K, DO  ondansetron  (ZOFRAN -ODT) 4 MG disintegrating tablet Take 1 tablet (4 mg total) by mouth every 8 (eight) hours as needed for nausea or vomiting. 03/31/22   Ethyl Richerd BROCKS,  MD  potassium chloride  (KLOR-CON ) 10 MEQ tablet Take 1 tablet (10 mEq total) by mouth 2 (two) times daily for 5 days. 03/31/22 04/05/22  Ethyl Richerd BROCKS, MD  QUEtiapine  (SEROQUEL ) 200 MG tablet Take 1 tablet (200 mg total) by mouth at bedtime. 01/25/22   Clapacs, Norleen DASEN, MD    Family History Family History  Problem Relation Age of Onset   Hypertension Mother    Cancer Mother    Hypertension Father     Social History Social History   Tobacco Use   Smoking status: Never   Smokeless tobacco: Never  Vaping Use   Vaping status: Never Used  Substance Use Topics   Alcohol  use: Yes    Comment: socially    Drug use: No    Types: Oxycodone , Hydrocodone     Comment: Quit  04/11/2012 herion two years ago     Allergies   Morphine  and codeine  and Paxil  [paroxetine ]   Review of Systems Review of Systems  Musculoskeletal:        Left foot and ankle pain     Physical Exam Triage Vital Signs ED Triage Vitals  Encounter Vitals Group     BP 05/17/23 1754 (!) 161/94     Systolic BP Percentile --      Diastolic BP Percentile --      Pulse Rate 05/17/23 1754 75     Resp 05/17/23 1754 16     Temp 05/17/23 1754 98.7 F (37.1 C)     Temp Source 05/17/23 1754 Oral     SpO2 05/17/23 1754 96 %     Weight --      Height --      Head Circumference --      Peak Flow --      Pain Score 05/17/23 1748 2     Pain Loc --      Pain Education --      Exclude from Growth Chart --    No data found.  Updated Vital Signs BP (!) 161/94 (BP Location: Right Arm)   Pulse 75   Temp 98.7 F (37.1 C) (Oral)   Resp 16   SpO2 96%   Visual Acuity Right Eye Distance:   Left Eye Distance:   Bilateral Distance:    Right Eye Near:   Left Eye Near:    Bilateral Near:     Physical Exam Vitals and nursing note reviewed.  Constitutional:      General: She is not in acute distress.    Appearance: Normal appearance. She is not ill-appearing.  HENT:     Head: Normocephalic and atraumatic.  Eyes:     Pupils: Pupils are equal, round, and reactive to light.  Cardiovascular:     Rate and Rhythm: Normal rate.  Pulmonary:     Effort: Pulmonary effort is normal.  Musculoskeletal:     Right ankle: Swelling present. No deformity, ecchymosis or lacerations. Tenderness present over the lateral malleolus. No medial malleolus or base of 5th metatarsal tenderness. Decreased range of motion. Normal pulse.     Right foot: Decreased range of motion. Normal capillary refill. Swelling, tenderness and bony tenderness present. No deformity, bunion, Charcot foot, foot drop or prominent metatarsal heads. Normal pulse.  Skin:    General: Skin is warm and dry.  Neurological:      General: No focal deficit present.     Mental Status: She is alert and oriented to person, place, and time.  Psychiatric:  Mood and Affect: Mood normal.        Behavior: Behavior normal.      UC Treatments / Results  Labs (all labs ordered are listed, but only abnormal results are displayed) Labs Reviewed - No data to display  EKG   Radiology DG Ankle Complete Right Result Date: 05/17/2023 CLINICAL DATA:  Right foot and ankle pain after injury. EXAM: RIGHT ANKLE - COMPLETE 3+ VIEW; RIGHT FOOT COMPLETE - 3+ VIEW COMPARISON:  None Available. FINDINGS: Foot: No evidence of acute fracture or dislocation. The alignment is normal. The joint spaces are preserved. There is mild dorsal soft tissue edema. No erosive change. Ankle: No acute fracture or dislocation. The ankle mortise is preserved. No definite ankle joint effusion. Mild soft tissue edema. No erosions. IMPRESSION: Mild soft tissue edema. No acute fracture or dislocation of the right foot or ankle. Electronically Signed   By: Andrea Gasman M.D.   On: 05/17/2023 17:42   DG Foot Complete Right Result Date: 05/17/2023 CLINICAL DATA:  Right foot and ankle pain after injury. EXAM: RIGHT ANKLE - COMPLETE 3+ VIEW; RIGHT FOOT COMPLETE - 3+ VIEW COMPARISON:  None Available. FINDINGS: Foot: No evidence of acute fracture or dislocation. The alignment is normal. The joint spaces are preserved. There is mild dorsal soft tissue edema. No erosive change. Ankle: No acute fracture or dislocation. The ankle mortise is preserved. No definite ankle joint effusion. Mild soft tissue edema. No erosions. IMPRESSION: Mild soft tissue edema. No acute fracture or dislocation of the right foot or ankle. Electronically Signed   By: Andrea Gasman M.D.   On: 05/17/2023 17:42    Procedures Procedures (including critical care time)  Medications Ordered in UC Medications - No data to display  Initial Impression / Assessment and Plan / UC Course  I have  reviewed the triage vital signs and the nursing notes.  Pertinent labs & imaging results that were available during my care of the patient were reviewed by me and considered in my medical decision making (see chart for details).     Reviewed exam and symptoms with patient.  No red flags.  X-ray of the foot and ankle are negative for fracture.  Discussed foot/ankle sprain.  Ace wrap applied in clinic and discussed RICE therapy.  Continue OTC analgesics as needed.  Patient fitted with crutches per request.  PCP follow-up 1 week if symptoms do not improve.  ER precautions reviewed. Final Clinical Impressions(s) / UC Diagnoses   Final diagnoses:  Sprain of right foot, initial encounter  Sprain of right ankle, unspecified ligament, initial encounter     Discharge Instructions      You may use the Ace wrap to your ankle and foot to help with swelling and support of the joint.  You may elevate and ice as needed.  Take over-the-counter Tylenol  or ibuprofen  as needed for pain.  You may use crutches to help you support the area while you walk.  Please follow-up with your PCP in 1 week if symptoms do not improve.  Please go to the ER for any worsening symptoms.  I hope you feel better soon!    ED Prescriptions   None    PDMP not reviewed this encounter.   Loreda Myla SAUNDERS, NP 05/17/23 [phone]

## 2023-08-29 ENCOUNTER — Ambulatory Visit
Admission: EM | Admit: 2023-08-29 | Discharge: 2023-08-29 | Disposition: A | Discharge status: Home / Self Care | Payer: Self-pay | Attending: Family Medicine | Admitting: Family Medicine

## 2023-08-29 DIAGNOSIS — Z79899 Other long term (current) drug therapy: Secondary | ICD-10-CM

## 2023-08-29 DIAGNOSIS — R0789 Other chest pain: Secondary | ICD-10-CM

## 2023-08-29 DIAGNOSIS — R42 Dizziness and giddiness: Secondary | ICD-10-CM

## 2023-08-29 DIAGNOSIS — F419 Anxiety disorder, unspecified: Secondary | ICD-10-CM

## 2023-08-29 DIAGNOSIS — R030 Elevated blood-pressure reading, without diagnosis of hypertension: Secondary | ICD-10-CM

## 2023-08-29 NOTE — ED Provider Notes (Signed)
 [facility]  Note:  This document was prepared using Conservation officer, historic buildings and may include unintentional dictation errors.  MRN: [id number] DOB: Dec 16, 1975  Subjective:   Heidi Pearson is a 48 y.o. female presenting for dizziness, lightheadedness, nausea and chest tightness/discomfort, ear ringing.  Patient was at work when her symptoms started.  She ended up taking 2 mg of Klonopin which she has to take as needed.  Has longstanding history of using benzodiazepines.  However, her symptoms had started before she took Klonopin today.  She also took other over-the-counter measures with minimal relief.  Reports that she has not eaten since lunchtime yesterday.  Works as a Sales executive.  Also does not hydrate as well which she should per patient.  States last time she had anything to drink was maybe in the evening yesterday.  No loss consciousness, syncope, history of cardiac arrhythmias.  No history of heart disease, stroke.  No smoking of any kind including cigarettes, cigars, vaping, marijuana use.  No history of hypertension.  She gets regular follow-up with her PCP.  Reports that she drinks alcohol occasionally/socially.  No current facility-administered medications for this encounter.  Current Outpatient Medications:    busPIRone (BUSPAR) 5 MG tablet, Take 5 mg by mouth 2 (two) times daily., Disp: , Rfl:    clonazePAM (KLONOPIN) 0.5 MG tablet, Take 1 tablet (0.5 mg total) by mouth 2 (two) times daily as needed for up to 10 doses for anxiety., Disp: 10 tablet, Rfl: 0   diclofenac Sodium (VOLTAREN) 1 % GEL, Apply 2 g topically 4 (four) times daily as needed., Disp: 100 g, Rfl: 0   dicyclomine (BENTYL) 20 MG tablet, Take 1 tablet (20 mg total) by mouth 2 (two) times daily., Disp: 20 tablet, Rfl: 0   famotidine (PEPCID) 20 MG tablet, Take 1 tablet (20 mg total) by mouth 2 (two) times daily., Disp: 20 tablet, Rfl: 0   hydrOXYzine (ATARAX) 50 MG tablet, Take  1 tablet (50 mg total) by mouth every 6 (six) hours as needed for anxiety., Disp: 20 tablet, Rfl: 0   melatonin 5 MG TABS, Take 0.5 tablets (2.5 mg total) by mouth at bedtime., Disp: 10 tablet, Rfl: 0   naproxen (NAPROSYN) 500 MG tablet, Take 1 tablet (500 mg total) by mouth 2 (two) times daily as needed for moderate pain, headache or mild pain., Disp: 20 tablet, Rfl: 0   ondansetron (ZOFRAN) 4 MG tablet, Take 1 tablet (4 mg total) by mouth every 6 (six) hours., Disp: 12 tablet, Rfl: 0   ondansetron (ZOFRAN-ODT) 4 MG disintegrating tablet, Take 1 tablet (4 mg total) by mouth every 8 (eight) hours as needed for nausea or vomiting., Disp: 20 tablet, Rfl: 0   potassium chloride (KLOR-CON) 10 MEQ tablet, Take 1 tablet (10 mEq total) by mouth 2 (two) times daily for 5 days., Disp: 10 tablet, Rfl: 0   QUEtiapine (SEROQUEL) 200 MG tablet, Take 1 tablet (200 mg total) by mouth at bedtime., Disp: 10 tablet, Rfl: 0   Allergies  Allergen Reactions   Morphine And Codeine Hives   Paxil [Paroxetine] Other (See Comments)    "I became manic"    Past Medical History:  Diagnosis Date   ADD (attention deficit disorder)    Anxiety    Anxiety    Chronic knee pain    Depression    PTSD (post-traumatic stress disorder)      Past Surgical History:  Procedure Laterality Date   CESAREAN  SECTION      Family History  Problem Relation Age of Onset   Hypertension Mother    Cancer Mother    Hypertension Father     Social History   Tobacco Use   Smoking status: Never   Smokeless tobacco: Never  Vaping Use   Vaping status: Never Used  Substance Use Topics   Alcohol use: Yes    Comment: socially    Drug use: No    Types: Oxycodone, Hydrocodone    Comment: Quit 04/11/2012 herion two years ago    ROS   Objective:   Vitals: BP (!) 147/91 (BP Location: Left Arm)   Pulse 94   Temp 98.3 F (36.8 C) (Oral)   Resp 20   LMP 08/29/2023 (Exact Date)   SpO2 97%   BP Readings from Last 3  Encounters:  08/29/23 (!) 147/91  05/17/23 (!) 161/94  06/02/22 128/82   Physical Exam Constitutional:      General: She is not in acute distress.    Appearance: Normal appearance. She is well-developed and normal weight. She is not ill-appearing, toxic-appearing or diaphoretic.  HENT:     Head: Normocephalic and atraumatic.     Right Ear: Tympanic membrane, ear canal and external ear normal. No drainage or tenderness. No middle ear effusion. There is no impacted cerumen. Tympanic membrane is not erythematous or bulging.     Left Ear: Tympanic membrane, ear canal and external ear normal. No drainage or tenderness.  No middle ear effusion. There is no impacted cerumen. Tympanic membrane is not erythematous or bulging.     Nose: Nose normal. No congestion or rhinorrhea.     Mouth/Throat:     Mouth: Mucous membranes are moist. No oral lesions.     Pharynx: No pharyngeal swelling, oropharyngeal exudate, posterior oropharyngeal erythema or uvula swelling.     Tonsils: No tonsillar exudate or tonsillar abscesses.  Eyes:     General: No scleral icterus.       Right eye: No discharge.        Left eye: No discharge.     Extraocular Movements: Extraocular movements intact.     Right eye: Normal extraocular motion.     Left eye: Normal extraocular motion.     Conjunctiva/sclera: Conjunctivae normal.  Cardiovascular:     Rate and Rhythm: Normal rate and regular rhythm.     Heart sounds: Normal heart sounds. No murmur heard.    No friction rub. No gallop.  Pulmonary:     Effort: Pulmonary effort is normal. No respiratory distress.     Breath sounds: No stridor. No wheezing, rhonchi or rales.  Chest:     Chest wall: No tenderness.  Musculoskeletal:     Cervical back: Normal range of motion and neck supple.  Lymphadenopathy:     Cervical: No cervical adenopathy.  Skin:    General: Skin is warm and dry.  Neurological:     General: No focal deficit present.     Mental Status: She is alert  and oriented to person, place, and time.     Cranial Nerves: No cranial nerve deficit.     Motor: No weakness.     Coordination: Coordination normal.     Gait: Gait normal.  Psychiatric:        Mood and Affect: Mood normal.        Behavior: Behavior normal.    ED ECG REPORT   Date: 08/29/2023  EKG Time: 4:18 PM  Rate: 80bpm  Rhythm: normal sinus rhythm,  unchanged from previous tracings  Axis: normal  Intervals:none  ST&T Change: T wave flattening in lead III  Narrative Interpretation: Sinus rhythm at 80 bpm with nonspecific T wave changes above.  Very comparable to previous EKG.    Assessment and Plan :   PDMP not reviewed this encounter.  1. Dizziness   2. Chest discomfort   3. Anxiety   4. Long-term current use of benzodiazepine   5. Elevated blood pressure reading in office without diagnosis of hypertension    Will pursue basic labs.  Emphasized need to drink water more consistently and eat regular healthy balanced meals without skipping meals daily she normally does.  Follow-up with her PCP.  Will defer blood pressure medication prescription unless she really continues to see elevated pressures even at home.  Low suspicion for an acute cardiopulmonary event or an acute encephalopathy.  Counseled patient on potential for adverse effects with medications prescribed/recommended today, ER and return-to-clinic precautions discussed, patient verbalized understanding.    Adolph Hoop, PA-C 08/29/23 1620

## 2023-08-29 NOTE — ED Triage Notes (Addendum)
 Pt c/o dizziness, ear ringings,light headed, nausea and chest tightness since this am.Took pepto bismol,mylanta,klonopin

## 2023-08-30 LAB — COMPREHENSIVE METABOLIC PANEL WITH GFR
ALT: 25 IU/L (ref 0–32)
AST: 27 IU/L (ref 0–40)
Albumin: 4.6 g/dL (ref 3.9–4.9)
Alkaline Phosphatase: 126 IU/L — ABNORMAL HIGH (ref 44–121)
BUN/Creatinine Ratio: 16 (ref 9–23)
BUN: 11 mg/dL (ref 6–24)
Bilirubin Total: 0.2 mg/dL (ref 0.0–1.2)
CO2: 24 mmol/L (ref 20–29)
Calcium: 10.1 mg/dL (ref 8.7–10.2)
Chloride: 100 mmol/L (ref 96–106)
Creatinine, Ser: 0.69 mg/dL (ref 0.57–1.00)
Globulin, Total: 3.1 g/dL (ref 1.5–4.5)
Glucose: 86 mg/dL (ref 70–99)
Potassium: 4.3 mmol/L (ref 3.5–5.2)
Sodium: 138 mmol/L (ref 134–144)
Total Protein: 7.7 g/dL (ref 6.0–8.5)
eGFR: 108 mL/min/{1.73_m2} (ref >59)

## 2023-08-30 LAB — TSH: TSH: 2.54 u[IU]/mL (ref 0.450–4.500)

## 2023-08-30 LAB — CBC
Hematocrit: 40.9 % (ref 34.0–46.6)
Hemoglobin: 13.2 g/dL (ref 11.1–15.9)
MCH: 28.3 pg (ref 26.6–33.0)
MCHC: 32.3 g/dL (ref 31.5–35.7)
MCV: 88 fL (ref 79–97)
Platelets: 345 10*3/uL (ref 150–450)
RBC: 4.66 x10E6/uL (ref 3.77–5.28)
RDW: 12.3 % (ref 11.7–15.4)
WBC: 5.5 10*3/uL (ref 3.4–10.8)

## 2023-09-07 ENCOUNTER — Ambulatory Visit
Admission: EM | Admit: 2023-09-07 | Discharge: 2023-09-07 | Disposition: A | Discharge status: Home / Self Care | Payer: Self-pay | Attending: Family Medicine | Admitting: Family Medicine

## 2023-09-07 DIAGNOSIS — U071 COVID-19: Secondary | ICD-10-CM

## 2023-09-07 DIAGNOSIS — J040 Acute laryngitis: Secondary | ICD-10-CM

## 2023-09-07 LAB — POCT RAPID STREP A (OFFICE): Rapid Strep A Screen: NEGATIVE

## 2023-09-07 LAB — POC COVID19/FLU A&B COMBO
Covid Antigen, POC: POSITIVE — AB
Influenza A Antigen, POC: NEGATIVE
Influenza B Antigen, POC: NEGATIVE

## 2023-09-07 MED ORDER — PROMETHAZINE-DM 6.25-15 MG/5ML PO SYRP: 5.0000 mL | ORAL_SOLUTION | Freq: Three times a day (TID) | ORAL | 0 refills | Status: AC | PRN

## 2023-09-07 MED ORDER — BENZONATATE 100 MG PO CAPS: 100.0000 mg | ORAL_CAPSULE | Freq: Three times a day (TID) | ORAL | 0 refills | Status: AC | PRN

## 2023-09-07 MED ORDER — DEXAMETHASONE SODIUM PHOSPHATE 10 MG/ML IJ SOLN
10.0000 mg | Freq: Once | INTRAMUSCULAR | Status: AC
  Administered 2023-09-07: 10 mg via INTRAMUSCULAR

## 2023-09-07 NOTE — ED Provider Notes (Signed)
 [facility]  Note:  This document was prepared using Conservation officer, historic buildings and may include unintentional dictation errors.  MRN: [id number] DOB: [birth year]  Subjective:   Heidi Pearson is a 48 y.o. female presenting for acute onset overnight of significant malaise, fever, throat pain, drainage, sinus headaches, body pains, hoarseness.  No chest pain, shortness of breath or wheezing.  Would like to have respiratory testing.  No history of asthma.  No current facility-administered medications for this encounter.  Current Outpatient Medications:    busPIRone  (BUSPAR ) 5 MG tablet, Take 5 mg by mouth 2 (two) times daily., Disp: , Rfl:    clonazePAM  (KLONOPIN ) 0.5 MG tablet, Take 1 tablet (0.5 mg total) by mouth 2 (two) times daily as needed for up to 10 doses for anxiety., Disp: 10 tablet, Rfl: 0   diclofenac  Sodium (VOLTAREN ) 1 % GEL, Apply 2 g topically 4 (four) times daily as needed., Disp: 100 g, Rfl: 0   dicyclomine  (BENTYL ) 20 MG tablet, Take 1 tablet (20 mg total) by mouth 2 (two) times daily., Disp: 20 tablet, Rfl: 0   famotidine  (PEPCID ) 20 MG tablet, Take 1 tablet (20 mg total) by mouth 2 (two) times daily., Disp: 20 tablet, Rfl: 0   hydrOXYzine  (ATARAX ) 50 MG tablet, Take 1 tablet (50 mg total) by mouth every 6 (six) hours as needed for anxiety., Disp: 20 tablet, Rfl: 0   melatonin 5 MG TABS, Take 0.5 tablets (2.5 mg total) by mouth at bedtime., Disp: 10 tablet, Rfl: 0   naproxen  (NAPROSYN ) 500 MG tablet, Take 1 tablet (500 mg total) by mouth 2 (two) times daily as needed for moderate pain, headache or mild pain., Disp: 20 tablet, Rfl: 0   ondansetron  (ZOFRAN ) 4 MG tablet, Take 1 tablet (4 mg total) by mouth every 6 (six) hours., Disp: 12 tablet, Rfl: 0   ondansetron  (ZOFRAN -ODT) 4 MG disintegrating tablet, Take 1 tablet (4 mg total) by mouth every 8 (eight) hours as needed for nausea or vomiting., Disp: 20 tablet, Rfl: 0   potassium chloride   (KLOR-CON ) 10 MEQ tablet, Take 1 tablet (10 mEq total) by mouth 2 (two) times daily for 5 days., Disp: 10 tablet, Rfl: 0   QUEtiapine  (SEROQUEL ) 200 MG tablet, Take 1 tablet (200 mg total) by mouth at bedtime., Disp: 10 tablet, Rfl: 0   Allergies  Allergen Reactions   Morphine  And Codeine  Hives   Paxil  [Paroxetine ] Other (See Comments)    "I became manic"    Past Medical History:  Diagnosis Date   ADD (attention deficit disorder)    Anxiety    Anxiety    Chronic knee pain    Depression    PTSD (post-traumatic stress disorder)      Past Surgical History:  Procedure Laterality Date   CESAREAN SECTION      Family History  Problem Relation Age of Onset   Hypertension Mother    Cancer Mother    Hypertension Father     Social History   Tobacco Use   Smoking status: Never   Smokeless tobacco: Never  Vaping Use   Vaping status: Never Used  Substance Use Topics   Alcohol  use: Yes    Comment: socially    Drug use: No    Types: Oxycodone , Hydrocodone     Comment: Quit 04/11/2012 herion two years ago    ROS   Objective:   Vitals: BP 125/86 (BP Location: Right Arm)   Pulse (!) 102  Temp 98.2 F (36.8 C) (Oral)   Resp 20   LMP 08/29/2023 (Exact Date)   SpO2 97%   Physical Exam Constitutional:      General: She is not in acute distress.    Appearance: Normal appearance. She is well-developed and normal weight. She is not ill-appearing, toxic-appearing or diaphoretic.  HENT:     Head: Normocephalic and atraumatic.     Right Ear: Tympanic membrane, ear canal and external ear normal. No drainage or tenderness. No middle ear effusion. There is no impacted cerumen. Tympanic membrane is not erythematous or bulging.     Left Ear: Tympanic membrane, ear canal and external ear normal. No drainage or tenderness.  No middle ear effusion. There is no impacted cerumen. Tympanic membrane is not erythematous or bulging.     Nose: Congestion present. No rhinorrhea.      Mouth/Throat:     Mouth: Mucous membranes are moist. No oral lesions.     Pharynx: Posterior oropharyngeal erythema (with thick streaks of post-nasal drainage overlying pharynx) present. No pharyngeal swelling, oropharyngeal exudate or uvula swelling.     Tonsils: No tonsillar exudate or tonsillar abscesses.     Comments: Hoarseness of voice noted. Eyes:     General: No scleral icterus.       Right eye: No discharge.        Left eye: No discharge.     Extraocular Movements: Extraocular movements intact.     Right eye: Normal extraocular motion.     Left eye: Normal extraocular motion.     Conjunctiva/sclera: Conjunctivae normal.  Cardiovascular:     Rate and Rhythm: Normal rate and regular rhythm.     Heart sounds: Normal heart sounds. No murmur heard.    No friction rub. No gallop.  Pulmonary:     Effort: Pulmonary effort is normal. No respiratory distress.     Breath sounds: No stridor. No wheezing, rhonchi or rales.  Chest:     Chest wall: No tenderness.  Musculoskeletal:     Cervical back: Normal range of motion and neck supple.  Lymphadenopathy:     Cervical: No cervical adenopathy.  Skin:    General: Skin is warm and dry.  Neurological:     General: No focal deficit present.     Mental Status: She is alert and oriented to person, place, and time.  Psychiatric:        Mood and Affect: Mood normal.        Behavior: Behavior normal.     Results for orders placed or performed during the hospital encounter of 09/07/23 (from the past 24 hours)  POCT rapid strep A     Status: None   Collection Time: 09/07/23  2:27 PM  Result Value Ref Range   Rapid Strep A Screen Negative Negative  POC Covid19/Flu A&B Antigen     Status: Abnormal   Collection Time: 09/07/23  2:50 PM  Result Value Ref Range   Influenza A Antigen, POC Negative Negative   Influenza B Antigen, POC Negative Negative   Covid Antigen, POC Positive (A) Negative   IM dexamethasone  10 mg administered in  clinic.  Assessment and Plan :   PDMP not reviewed this encounter.  1. COVID-19   2. Laryngitis    Deferred imaging given clear cardiopulmonary exam, hemodynamically stable vital signs. Interventions as above.  Will manage COVID-19 with supportive care. Counseled patient on nature of COVID-19 including modes of transmission, diagnostic testing, management and supportive care.  Offered  symptomatic relief. Counseled patient on potential for adverse effects with medications prescribed/recommended today, strict ER and return-to-clinic precautions discussed, patient verbalized understanding.     Adolph Hoop, [place] 09/07/23 1456

## 2023-09-07 NOTE — ED Triage Notes (Signed)
 Pt c/o sore throat, fever, headache and body aches since last night.Taking ibuprofen
